# Patient Record
Sex: Female | Born: 1985 | Race: White | Hispanic: No | Marital: Married | Smoking: Never smoker
Health system: Southern US, Community
[De-identification: ages and names within clinical notes are randomized; demographics above are authoritative.]

## PROBLEM LIST (undated history)

## (undated) DIAGNOSIS — L409 Psoriasis, unspecified: Secondary | ICD-10-CM

## (undated) DIAGNOSIS — G43909 Migraine, unspecified, not intractable, without status migrainosus: Secondary | ICD-10-CM

## (undated) DIAGNOSIS — R519 Headache, unspecified: Secondary | ICD-10-CM

## (undated) DIAGNOSIS — R51 Headache: Secondary | ICD-10-CM

## (undated) DIAGNOSIS — O1490 Unspecified pre-eclampsia, unspecified trimester: Secondary | ICD-10-CM

## (undated) DIAGNOSIS — R011 Cardiac murmur, unspecified: Secondary | ICD-10-CM

## (undated) HISTORY — DX: Unspecified pre-eclampsia, unspecified trimester: O14.90

## (undated) HISTORY — DX: Psoriasis, unspecified: L40.9

## (undated) HISTORY — DX: Cardiac murmur, unspecified: R01.1

## (undated) HISTORY — DX: Headache: R51

## (undated) HISTORY — DX: Migraine, unspecified, not intractable, without status migrainosus: G43.909

## (undated) HISTORY — DX: Headache, unspecified: R51.9

---
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
## 2008-04-25 ENCOUNTER — Emergency Department: Payer: Self-pay | Admitting: Unknown Physician Specialty

## 2008-04-29 ENCOUNTER — Encounter: Payer: Self-pay | Admitting: General Practice

## 2008-05-03 ENCOUNTER — Encounter: Payer: Self-pay | Admitting: General Practice

## 2008-06-03 ENCOUNTER — Encounter: Payer: Self-pay | Admitting: General Practice

## 2008-07-03 ENCOUNTER — Encounter: Payer: Self-pay | Admitting: General Practice

## 2008-08-03 ENCOUNTER — Encounter: Payer: Self-pay | Admitting: General Practice

## 2009-01-08 ENCOUNTER — Ambulatory Visit: Payer: Self-pay | Admitting: Family

## 2009-01-09 ENCOUNTER — Other Ambulatory Visit: Payer: Self-pay | Admitting: Family

## 2009-12-31 ENCOUNTER — Emergency Department: Payer: Self-pay | Admitting: Emergency Medicine

## 2010-06-07 ENCOUNTER — Encounter: Payer: Self-pay | Admitting: Obstetrics and Gynecology

## 2010-08-18 ENCOUNTER — Observation Stay: Payer: Self-pay | Admitting: Obstetrics and Gynecology

## 2010-09-28 ENCOUNTER — Observation Stay: Payer: Self-pay

## 2010-10-25 ENCOUNTER — Ambulatory Visit: Payer: Self-pay | Admitting: Obstetrics and Gynecology

## 2010-10-26 ENCOUNTER — Ambulatory Visit: Payer: Self-pay | Admitting: Obstetrics and Gynecology

## 2010-11-03 ENCOUNTER — Observation Stay: Payer: Self-pay

## 2010-12-06 ENCOUNTER — Inpatient Hospital Stay: Payer: Self-pay | Admitting: Obstetrics and Gynecology

## 2012-03-23 ENCOUNTER — Emergency Department: Payer: Self-pay | Admitting: Emergency Medicine

## 2012-03-23 LAB — URINALYSIS, COMPLETE
Blood: NEGATIVE
Glucose,UR: NEGATIVE mg/dL (ref 0–75)
Ketone: NEGATIVE
Ph: 6 (ref 4.5–8.0)
RBC,UR: 3 /HPF (ref 0–5)
Specific Gravity: 1.029 (ref 1.003–1.030)
Squamous Epithelial: 4

## 2012-03-23 LAB — COMPREHENSIVE METABOLIC PANEL
Alkaline Phosphatase: 65 U/L (ref 50–136)
Anion Gap: 9 (ref 7–16)
BUN: 6 mg/dL — ABNORMAL LOW (ref 7–18)
Bilirubin,Total: 0.7 mg/dL (ref 0.2–1.0)
Co2: 24 mmol/L (ref 21–32)
EGFR (African American): >60
EGFR (Non-African Amer.): >60
Glucose: 89 mg/dL (ref 65–99)
Potassium: 3 mmol/L — ABNORMAL LOW (ref 3.5–5.1)
SGOT(AST): 20 U/L (ref 15–37)
SGPT (ALT): 18 U/L (ref 12–78)
Total Protein: 7.8 g/dL (ref 6.4–8.2)

## 2012-03-23 LAB — CBC
HCT: 39.5 % (ref 35.0–47.0)
HGB: 13.5 g/dL (ref 12.0–16.0)
MCH: 29.9 pg (ref 26.0–34.0)
Platelet: 245 10*3/uL (ref 150–440)
RBC: 4.53 10*6/uL (ref 3.80–5.20)
RDW: 13.7 % (ref 11.5–14.5)
WBC: 7.9 10*3/uL (ref 3.6–11.0)

## 2012-03-26 ENCOUNTER — Ambulatory Visit: Payer: Self-pay | Admitting: Obstetrics and Gynecology

## 2012-03-26 LAB — HEMOGLOBIN: HGB: 13.3 g/dL (ref 12.0–16.0)

## 2012-03-27 ENCOUNTER — Ambulatory Visit: Payer: Self-pay | Admitting: Obstetrics and Gynecology

## 2012-03-28 LAB — PATHOLOGY REPORT

## 2012-11-20 ENCOUNTER — Emergency Department: Payer: Self-pay | Admitting: Emergency Medicine

## 2012-11-20 LAB — URINALYSIS, COMPLETE
Bacteria: NONE SEEN
Glucose,UR: NEGATIVE mg/dL (ref 0–75)
Nitrite: NEGATIVE
Protein: NEGATIVE
RBC,UR: <1 /HPF (ref 0–5)
Specific Gravity: 1.01 (ref 1.003–1.030)
Squamous Epithelial: 7
WBC UR: 1 /HPF (ref 0–5)

## 2012-11-20 LAB — CBC WITH DIFFERENTIAL/PLATELET
Basophil #: 0 10*3/uL (ref 0.0–0.1)
Basophil %: 0.2 %
HCT: 42.6 % (ref 35.0–47.0)
HGB: 14.5 g/dL (ref 12.0–16.0)
MCHC: 34 g/dL (ref 32.0–36.0)
Monocyte %: 1.7 %
Neutrophil #: 9.4 10*3/uL — ABNORMAL HIGH (ref 1.4–6.5)
Neutrophil %: 87.6 %
RBC: 4.88 10*6/uL (ref 3.80–5.20)

## 2012-11-20 LAB — COMPREHENSIVE METABOLIC PANEL
Albumin: 4 g/dL (ref 3.4–5.0)
Alkaline Phosphatase: 85 U/L (ref 50–136)
Anion Gap: 9 (ref 7–16)
Calcium, Total: 9.6 mg/dL (ref 8.5–10.1)
EGFR (Non-African Amer.): >60
Glucose: 105 mg/dL — ABNORMAL HIGH (ref 65–99)
Potassium: 3.7 mmol/L (ref 3.5–5.1)
Total Protein: 7.8 g/dL (ref 6.4–8.2)

## 2012-11-20 LAB — LIPASE, BLOOD: Lipase: 83 U/L (ref 73–393)

## 2012-11-20 LAB — HCG, QUANTITATIVE, PREGNANCY: Beta Hcg, Quant.: 101458 m[IU]/mL — ABNORMAL HIGH

## 2013-01-03 HISTORY — PX: CHOLECYSTECTOMY: SHX55

## 2013-03-12 DIAGNOSIS — E348 Other specified endocrine disorders: Secondary | ICD-10-CM | POA: Insufficient documentation

## 2013-03-12 DIAGNOSIS — IMO0002 Reserved for concepts with insufficient information to code with codable children: Secondary | ICD-10-CM | POA: Insufficient documentation

## 2013-04-25 ENCOUNTER — Inpatient Hospital Stay: Payer: Self-pay

## 2013-04-25 LAB — COMPREHENSIVE METABOLIC PANEL
AST: 16 U/L (ref 15–37)
Albumin: 2.6 g/dL — ABNORMAL LOW (ref 3.4–5.0)
Alkaline Phosphatase: 115 U/L
Anion Gap: 8 (ref 7–16)
BILIRUBIN TOTAL: 1.3 mg/dL — AB (ref 0.2–1.0)
BUN: 2 mg/dL — AB (ref 7–18)
Calcium, Total: 8 mg/dL — ABNORMAL LOW (ref 8.5–10.1)
Chloride: 109 mmol/L — ABNORMAL HIGH (ref 98–107)
Co2: 24 mmol/L (ref 21–32)
Creatinine: 0.55 mg/dL — ABNORMAL LOW (ref 0.60–1.30)
EGFR (African American): >60
Glucose: 81 mg/dL (ref 65–99)
OSMOLALITY: 276 (ref 275–301)
POTASSIUM: 3 mmol/L — AB (ref 3.5–5.1)
SGPT (ALT): 16 U/L (ref 12–78)
Sodium: 141 mmol/L (ref 136–145)
Total Protein: 5.9 g/dL — ABNORMAL LOW (ref 6.4–8.2)

## 2013-04-25 LAB — URINALYSIS, COMPLETE
BILIRUBIN, UR: NEGATIVE
Bacteria: NONE SEEN
Blood: NEGATIVE
Glucose,UR: NEGATIVE mg/dL (ref 0–75)
Nitrite: NEGATIVE
PROTEIN: NEGATIVE
Ph: 7 (ref 4.5–8.0)
RBC,UR: 1 /HPF (ref 0–5)
Specific Gravity: 1.009 (ref 1.003–1.030)

## 2013-04-25 LAB — CBC WITH DIFFERENTIAL/PLATELET
Basophil #: 0 10*3/uL (ref 0.0–0.1)
Basophil %: 0.3 %
Eosinophil #: 0 10*3/uL (ref 0.0–0.7)
Eosinophil %: 0.6 %
HCT: 37.4 % (ref 35.0–47.0)
HGB: 12.5 g/dL (ref 12.0–16.0)
Lymphocyte #: 1.6 10*3/uL (ref 1.0–3.6)
Lymphocyte %: 24.6 %
MCH: 29.8 pg (ref 26.0–34.0)
MCHC: 33.4 g/dL (ref 32.0–36.0)
MCV: 89 fL (ref 80–100)
MONO ABS: 0.4 x10 3/mm (ref 0.2–0.9)
Monocyte %: 6.9 %
NEUTROS ABS: 4.4 10*3/uL (ref 1.4–6.5)
NEUTROS PCT: 67.6 %
Platelet: 171 10*3/uL (ref 150–440)
RBC: 4.19 10*6/uL (ref 3.80–5.20)
RDW: 13.6 % (ref 11.5–14.5)
WBC: 6.5 10*3/uL (ref 3.6–11.0)

## 2013-04-25 LAB — AMYLASE: Amylase: 30 U/L (ref 25–115)

## 2013-04-25 LAB — LIPASE, BLOOD: Lipase: 79 U/L (ref 73–393)

## 2013-04-26 LAB — BASIC METABOLIC PANEL
Anion Gap: 9 (ref 7–16)
BUN: 1 mg/dL — ABNORMAL LOW (ref 7–18)
CO2: 23 mmol/L (ref 21–32)
Calcium, Total: 7.9 mg/dL — ABNORMAL LOW (ref 8.5–10.1)
Chloride: 111 mmol/L — ABNORMAL HIGH (ref 98–107)
Creatinine: 0.42 mg/dL — ABNORMAL LOW (ref 0.60–1.30)
Glucose: 97 mg/dL (ref 65–99)
OSMOLALITY: 281 (ref 275–301)
Potassium: 3.5 mmol/L (ref 3.5–5.1)
Sodium: 143 mmol/L (ref 136–145)

## 2013-04-27 LAB — BASIC METABOLIC PANEL
ANION GAP: 5 — AB (ref 7–16)
BUN: 2 mg/dL — ABNORMAL LOW (ref 7–18)
CO2: 25 mmol/L (ref 21–32)
Calcium, Total: 7.8 mg/dL — ABNORMAL LOW (ref 8.5–10.1)
Chloride: 111 mmol/L — ABNORMAL HIGH (ref 98–107)
Creatinine: 0.53 mg/dL — ABNORMAL LOW (ref 0.60–1.30)
Glucose: 96 mg/dL (ref 65–99)
OSMOLALITY: 277 (ref 275–301)
Potassium: 3.6 mmol/L (ref 3.5–5.1)
SODIUM: 141 mmol/L (ref 136–145)

## 2013-04-29 ENCOUNTER — Observation Stay: Payer: Self-pay | Admitting: Obstetrics and Gynecology

## 2013-05-02 ENCOUNTER — Inpatient Hospital Stay: Payer: Self-pay | Admitting: Obstetrics and Gynecology

## 2013-05-02 LAB — CBC WITH DIFFERENTIAL/PLATELET
BASOS ABS: 0.1 10*3/uL (ref 0.0–0.1)
BASOS PCT: 0.7 %
EOS PCT: 0.3 %
Eosinophil #: 0 10*3/uL (ref 0.0–0.7)
HCT: 39.2 % (ref 35.0–47.0)
HGB: 12.8 g/dL (ref 12.0–16.0)
Lymphocyte #: 1.5 10*3/uL (ref 1.0–3.6)
Lymphocyte %: 19.2 %
MCH: 29 pg (ref 26.0–34.0)
MCHC: 32.7 g/dL (ref 32.0–36.0)
MCV: 89 fL (ref 80–100)
Monocyte #: 0.4 x10 3/mm (ref 0.2–0.9)
Monocyte %: 5.4 %
NEUTROS ABS: 6 10*3/uL (ref 1.4–6.5)
Neutrophil %: 74.4 %
PLATELETS: 169 10*3/uL (ref 150–440)
RBC: 4.42 10*6/uL (ref 3.80–5.20)
RDW: 13.5 % (ref 11.5–14.5)
WBC: 8 10*3/uL (ref 3.6–11.0)

## 2013-05-03 LAB — HEMATOCRIT: HCT: 33.7 % — ABNORMAL LOW (ref 35.0–47.0)

## 2013-05-03 LAB — GC/CHLAMYDIA PROBE AMP

## 2013-06-24 ENCOUNTER — Ambulatory Visit: Payer: Self-pay | Admitting: Surgery

## 2013-06-24 LAB — CBC WITH DIFFERENTIAL/PLATELET
BASOS PCT: 0.9 %
Basophil #: 0.1 10*3/uL (ref 0.0–0.1)
EOS PCT: 2.3 %
Eosinophil #: 0.1 10*3/uL (ref 0.0–0.7)
HCT: 38.8 % (ref 35.0–47.0)
HGB: 12.7 g/dL (ref 12.0–16.0)
LYMPHS PCT: 36 %
Lymphocyte #: 2.2 10*3/uL (ref 1.0–3.6)
MCH: 28.9 pg (ref 26.0–34.0)
MCHC: 32.9 g/dL (ref 32.0–36.0)
MCV: 88 fL (ref 80–100)
MONO ABS: 0.3 x10 3/mm (ref 0.2–0.9)
Monocyte %: 5.5 %
NEUTROS ABS: 3.4 10*3/uL (ref 1.4–6.5)
NEUTROS PCT: 55.3 %
Platelet: 272 10*3/uL (ref 150–440)
RBC: 4.41 10*6/uL (ref 3.80–5.20)
RDW: 13.7 % (ref 11.5–14.5)
WBC: 6.1 10*3/uL (ref 3.6–11.0)

## 2013-06-24 LAB — BASIC METABOLIC PANEL
ANION GAP: 8 (ref 7–16)
BUN: 11 mg/dL (ref 7–18)
CALCIUM: 9 mg/dL (ref 8.5–10.1)
CO2: 25 mmol/L (ref 21–32)
CREATININE: 0.94 mg/dL (ref 0.60–1.30)
Chloride: 107 mmol/L (ref 98–107)
EGFR (African American): >60
EGFR (Non-African Amer.): >60
GLUCOSE: 84 mg/dL (ref 65–99)
OSMOLALITY: 278 (ref 275–301)
POTASSIUM: 4.1 mmol/L (ref 3.5–5.1)
Sodium: 140 mmol/L (ref 136–145)

## 2013-06-24 LAB — HEPATIC FUNCTION PANEL A (ARMC)
ALT: 85 U/L — AB (ref 12–78)
AST: 22 U/L (ref 15–37)
Albumin: 3.9 g/dL (ref 3.4–5.0)
Alkaline Phosphatase: 211 U/L — ABNORMAL HIGH
BILIRUBIN DIRECT: 0.2 mg/dL (ref 0.00–0.20)
Bilirubin,Total: 0.9 mg/dL (ref 0.2–1.0)
Total Protein: 7.1 g/dL (ref 6.4–8.2)

## 2013-07-03 ENCOUNTER — Ambulatory Visit: Payer: Self-pay | Admitting: Surgery

## 2013-07-05 LAB — PATHOLOGY REPORT

## 2013-09-13 ENCOUNTER — Encounter: Payer: Self-pay | Admitting: Internal Medicine

## 2013-09-13 ENCOUNTER — Ambulatory Visit (INDEPENDENT_AMBULATORY_CARE_PROVIDER_SITE_OTHER): Payer: 59 | Admitting: Internal Medicine

## 2013-09-13 VITALS — BP 108/66 | HR 99 | Temp 97.9°F | Resp 14 | Ht 62.5 in | Wt 121.0 lb

## 2013-09-13 DIAGNOSIS — O26899 Other specified pregnancy related conditions, unspecified trimester: Secondary | ICD-10-CM

## 2013-09-13 DIAGNOSIS — K915 Postcholecystectomy syndrome: Secondary | ICD-10-CM | POA: Insufficient documentation

## 2013-09-13 DIAGNOSIS — O26893 Other specified pregnancy related conditions, third trimester: Secondary | ICD-10-CM

## 2013-09-13 DIAGNOSIS — M545 Low back pain, unspecified: Secondary | ICD-10-CM

## 2013-09-13 DIAGNOSIS — R5383 Other fatigue: Principal | ICD-10-CM

## 2013-09-13 DIAGNOSIS — R5381 Other malaise: Secondary | ICD-10-CM

## 2013-09-13 LAB — COMPREHENSIVE METABOLIC PANEL
ALK PHOS: 83 U/L (ref 39–117)
ALT: 29 U/L (ref 0–35)
AST: 21 U/L (ref 0–37)
Albumin: 4.3 g/dL (ref 3.5–5.2)
BUN: 11 mg/dL (ref 6–23)
CALCIUM: 9.4 mg/dL (ref 8.4–10.5)
CO2: 28 meq/L (ref 19–32)
Chloride: 105 mEq/L (ref 96–112)
Creatinine, Ser: 0.7 mg/dL (ref 0.4–1.2)
GFR: 113.16 mL/min (ref >60.00)
Glucose, Bld: 83 mg/dL (ref 70–99)
POTASSIUM: 4.2 meq/L (ref 3.5–5.1)
Sodium: 139 mEq/L (ref 135–145)
Total Bilirubin: 0.8 mg/dL (ref 0.2–1.2)
Total Protein: 7 g/dL (ref 6.0–8.3)

## 2013-09-13 LAB — CBC WITH DIFFERENTIAL/PLATELET
BASOS ABS: 0 10*3/uL (ref 0.0–0.1)
Basophils Relative: 0.4 % (ref 0.0–3.0)
Eosinophils Absolute: 0.1 10*3/uL (ref 0.0–0.7)
Eosinophils Relative: 1.6 % (ref 0.0–5.0)
HCT: 41.6 % (ref 36.0–46.0)
Hemoglobin: 14 g/dL (ref 12.0–15.0)
LYMPHS PCT: 34 % (ref 12.0–46.0)
Lymphs Abs: 1.7 10*3/uL (ref 0.7–4.0)
MCHC: 33.8 g/dL (ref 30.0–36.0)
MCV: 85.6 fl (ref 78.0–100.0)
Monocytes Absolute: 0.3 10*3/uL (ref 0.1–1.0)
Monocytes Relative: 6.6 % (ref 3.0–12.0)
Neutro Abs: 2.8 10*3/uL (ref 1.4–7.7)
Neutrophils Relative %: 57.4 % (ref 43.0–77.0)
Platelets: 239 10*3/uL (ref 150.0–400.0)
RBC: 4.86 Mil/uL (ref 3.87–5.11)
RDW: 13.9 % (ref 11.5–15.5)
WBC: 4.9 10*3/uL (ref 4.0–10.5)

## 2013-09-13 LAB — LIPID PANEL
Cholesterol: 91 mg/dL (ref 0–200)
HDL: 47.6 mg/dL (ref >39.00)
LDL CALC: 37 mg/dL (ref 0–99)
NonHDL: 43.4
TRIGLYCERIDES: 31 mg/dL (ref 0.0–149.0)
Total CHOL/HDL Ratio: 2
VLDL: 6.2 mg/dL (ref 0.0–40.0)

## 2013-09-13 LAB — VITAMIN B12: Vitamin B-12: 268 pg/mL (ref 211–911)

## 2013-09-13 LAB — TSH: TSH: 2.06 u[IU]/mL (ref 0.35–4.50)

## 2013-09-13 LAB — FERRITIN: FERRITIN: 32.4 ng/mL (ref 10.0–291.0)

## 2013-09-13 LAB — SEDIMENTATION RATE: SED RATE: 6 mm/h (ref 0–22)

## 2013-09-13 LAB — C-REACTIVE PROTEIN: CRP: <0.5 mg/dL (ref 0.5–20.0)

## 2013-09-13 MED ORDER — CHOLESTYRAMINE 4 G PO PACK: 4.0000 g | PACK | Freq: Three times a day (TID) | ORAL | Status: DC

## 2013-09-13 NOTE — Progress Notes (Signed)
Pre visit review using our clinic review tool, if applicable. No additional management support is needed unless otherwise documented below in the visit note. 

## 2013-09-13 NOTE — Patient Instructions (Signed)
Cholestyramine powder for oral suspension What is this medicine? CHOLESTYRAMINE (koe LESS tir a meen) is used to lower cholesterol in patients who are at risk of heart disease or stroke. This medicine is only for patients whose cholesterol level is not controlled by diet. This medicine may be used for other purposes; ask your health care provider or pharmacist if you have questions. COMMON BRAND NAME(S): Locholest, Locholest Light, Prevalite, Questran, Questran Light What should I tell my health care provider before I take this medicine? They need to know if you have any of these conditions: -blocked bile duct -an unusual or allergic reaction to cholestyramine, other medicines, foods, dyes, or preservatives -pregnant or trying to get pregnant -breast-feeding How should I use this medicine? Do not take this medicine in the dry form. It must be mixed with a liquid before swallowing. Follow the directions on the prescription label. Place the powder in a glass or cup. Add between 2 and 6 ounces of fluid. This can be water, milk, pulpy fruit juice, fluid soup, or other liquid. Mix well and drink all of the liquid. Take your doses at regular intervals. Do not take your medicine more often than directed. Talk to your pediatrician regarding the use of this medicine in children. Special care may be needed. Overdosage: If you think you have taken too much of this medicine contact a poison control center or emergency room at once. NOTE: This medicine is only for you. Do not share this medicine with others. What if I miss a dose? If you miss a dose, take it as soon as you can. If it is almost time for your next dose, take only that dose. Do not take double or extra doses. What may interact with this medicine? -diuretics -female hormones, like estrogens or progestins and birth control pills -heart medicines such as digoxin or digitoxin -penicillin  G -phenobarbital -phenylbutazone -phytonadione -propranolol -tetracycline antibiotics -thyroid hormones -vitamin A -vitamin D -vitamin E -warfarin Take other drugs at least 1 hour before or 4 hours after this medicine, to avoid decreasing their absorption. This list may not describe all possible interactions. Give your health care provider a list of all the medicines, herbs, non-prescription drugs, or dietary supplements you use. Also tell them if you smoke, drink alcohol, or use illegal drugs. Some items may interact with your medicine. What should I watch for while using this medicine? Visit your doctor or health care professional for regular checks on your progress. Your blood fats and other tests will be measured from time to time. This medicine is only part of a total cholesterol-lowering program. Your health care professional or dietician can suggest a low-cholesterol and low-fat diet that will reduce your risk of getting heart and blood vessel disease. Avoid alcohol and smoking, and keep a proper exercise schedule. To reduce the chance of getting constipated, drink plenty of water and increase the amount of fiber in your diet. Ask your doctor or health care professional for advice if you are constipated. What side effects may I notice from receiving this medicine? Side effects that you should report to your doctor or health care professional as soon as possible: -allergic reactions like skin rash, itching or hives, swelling of the face, lips, or tongue -bloody or black, tarry stools -severe stomach pain with nausea and vomiting -unusual bleeding Side effects that usually do not require medical attention (report to your doctor or health care professional if they continue or are bothersome): -constipation or diarrhea -dizziness -headache -heartburn,   indigestion -nausea, vomiting -perianal irritation This list may not describe all possible side effects. Call your doctor for medical  advice about side effects. You may report side effects to FDA at 1-800-FDA-1088. Where should I keep my medicine? Keep out of the reach of children. Store at room temperature between 15 and 30 degrees C (59 and 86 degrees F). Throw away any unused medicine after the expiration date. NOTE: This sheet is a summary. It may not cover all possible information. If you have questions about this medicine, talk to your doctor, pharmacist, or health care provider.  2015, Elsevier/Gold Standard. (2007-03-27 15:33:42)  

## 2013-09-13 NOTE — Progress Notes (Signed)
Patient ID: Lori Douglas, female   DOB: [birth year], 28 y.o.   MRN: [id number] Patient Active Problem List   Diagnosis Date Noted  . Other malaise and fatigue 09/13/2013  . Low back pain during pregnancy 09/13/2013  . 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 Establish Care    HPI:   Lori Feild Robbinsis a 28 y.o. female who presents with excessive fatigue, persistent diarrhea.  Back pain.  History:  Patient is a married mother of two young children wmployed as an Therapist, sports at [employer]., referred by husband Joneen Boers.  She underwent a  section 4 /30   For arrested delivery and impending fetal demise of  full term infant that had become wedged in pelvis.  During the c section was told she lost 750 ml blood.   Was not transfused.  [place].   Son spent 3 days in NICU, for respiratory failure , did not require intubation. Patient's recovery as followed by a cholecystectomy July 3532 with no complications.   She has been taking  Depo Provera since then, had 3 weeks of menstrual bleeding. Feels excessively tired.  Has been losing hair in clumps.  Since surgery her BMs have been loose post prandial movements alternating with hard stools.  No blood in stools,  No nausea. She has  lost weight , weighs less now than before her pregnancy, but she is breast feeding.    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    Past Medical History  Diagnosis Date  . Frequent headaches   . Heart murmur   . Preeclampsia   . Migraine   . Psoriasis        _0 @  Past Surgical History  Procedure Laterality Date  .  Cholecystectomy  2015  . Cesarean section  2015    History   Social History  . Marital Status: Married    Spouse Name: N/A    Number of Children: N/A  . Years of Education: N/A   Occupational History  . Not on file.   Social History Main Topics  . Smoking status: Never Smoker   . Smokeless tobacco: Never Used  . Alcohol Use: No  . Drug Use: No  . Sexual Activity: Not on file   Other Topics Concern  . Not on file   Social History Narrative  . No narrative on file    _1 @       Review of Systems:   The rest of the review of systems was negative except those addressed in the HPI.      Objective:  BP 108/66  Pulse 99  Temp(Src) 97.9 F (36.6 C) (Oral)  Resp 14  Ht 5' 2.5" (1.588 m)  Wt 121 lb (54.885 kg)  BMI 21.76 kg/m2  SpO2 98%  LMP 07/30/2013  General appearance: alert, cooperative and appears stated age Ears: normal TM's and external ear canals both ears Throat: lips, mucosa, and tongue normal; teeth and gums normal Neck: no adenopathy, no carotid bruit, supple, symmetrical, trachea midline and thyroid not enlarged, symmetric, no tenderness/mass/nodules Back:  symmetric, no curvature. ROM normal. No CVA tenderness. Lungs: clear to auscultation bilaterally Heart: regular rate and rhythm, S1, S2 normal, no murmur, click, rub or gallop Abdomen: soft, non-tender; bowel sounds normal; no masses,  no organomegaly Pulses: 2+ and symmetric Skin: Skin color, texture, turgor normal. No rashes or lesions Lymph nodes: Cervical, supraclavicular, and axillary nodes normal.  Assessment and Plan:  Other malaise and fatigue She has no signs of anemia, B12 deficiency or thryoid disorder but her iron stores are borderline low .  Will recommended treating with wellbutrin or mirtazapine as a trial for post partum depression   Low back pain during pregnancy CRP and ESR are normal os psoriatic arthritis unlikely.  Plain films of pelvis ordered to rule out  subacute fracture  Post-cholecystectomy syndrome Trial of Questran pre meal    Updated Medication List Outpatient Encounter Prescriptions as of 09/13/2013  Medication Sig  . calcipotriene-betamethasone (TACLONEX) ointment Apply 1 application topically daily.  . medroxyPROGESTERone (DEPO-PROVERA) 150 MG/ML injection Inject 150 mg into the muscle every 3 (three) months.   . Prenatal Multivit-Min-Fe-FA (PRENATAL VITAMINS PO) Take 1 tablet by mouth daily.  . ranitidine (ZANTAC) 150 MG capsule Take 150 mg by mouth 2 (two) times daily.   . cholestyramine (QUESTRAN) 4 G packet Take 1 packet (4 g total) by mouth 3 (three) times daily with meals.     Orders Placed This Encounter  Procedures  . DG Pelvis Comp Min 3V  . Vitamin B12  . CBC with Differential  . Folate RBC  . Ferritin  . Iron and TIBC  . TSH  . Lipid panel  . Comprehensive metabolic panel  . Sedimentation rate  . ANA w/Reflex if Positive  . C-reactive protein  . Rheumatoid factor    No Follow-up on file.

## 2013-09-14 ENCOUNTER — Encounter: Payer: Self-pay | Admitting: Internal Medicine

## 2013-09-14 LAB — IRON AND TIBC
%SAT: 17 % — ABNORMAL LOW (ref 20–55)
Iron: 65 ug/dL (ref 42–145)
TIBC: 380 ug/dL (ref 250–470)
UIBC: 315 ug/dL (ref 125–400)

## 2013-09-14 LAB — RHEUMATOID FACTOR: Rhuematoid fact SerPl-aCnc: <10 IU/mL (ref <=14)

## 2013-09-15 NOTE — Assessment & Plan Note (Signed)
Trial of Questran pre meal

## 2013-09-15 NOTE — Assessment & Plan Note (Signed)
CRP and ESR are normal os psoriatic arthritis unlikely.  Plain films of pelvis ordered to rule out subacute fracture

## 2013-09-15 NOTE — Assessment & Plan Note (Signed)
She has no signs of anemia, B12 deficiency or thryoid disorder but her iron stores are borderline low .  Will recommended treating with wellbutrin or mirtazapine as a trial for post partum depression

## 2013-09-17 ENCOUNTER — Other Ambulatory Visit: Payer: Self-pay | Admitting: Internal Medicine

## 2013-09-17 DIAGNOSIS — F53 Postpartum depression: Secondary | ICD-10-CM | POA: Insufficient documentation

## 2013-09-17 DIAGNOSIS — O99345 Other mental disorders complicating the puerperium: Principal | ICD-10-CM

## 2013-09-17 LAB — FOLATE

## 2013-09-17 LAB — ANA W/REFLEX IF POSITIVE: ANA: NEGATIVE

## 2013-09-17 MED ORDER — BUPROPION HCL ER (XL) 150 MG PO TB24: 150.0000 mg | ORAL_TABLET | Freq: Every day | ORAL | Status: DC

## 2013-10-15 ENCOUNTER — Ambulatory Visit: Payer: Self-pay | Admitting: Internal Medicine

## 2013-10-16 ENCOUNTER — Telehealth: Payer: Self-pay | Admitting: Internal Medicine

## 2013-10-16 DIAGNOSIS — M533 Sacrococcygeal disorders, not elsewhere classified: Secondary | ICD-10-CM

## 2013-10-16 NOTE — Telephone Encounter (Signed)
Need order to fax to [facility] for view of pelvis sacrum and coccyx please advise DX to associate with and look at orders in system are OK?

## 2013-10-18 ENCOUNTER — Ambulatory Visit: Payer: Self-pay | Admitting: Internal Medicine

## 2013-10-21 ENCOUNTER — Telehealth: Payer: Self-pay | Admitting: Internal Medicine

## 2013-10-21 ENCOUNTER — Ambulatory Visit (INDEPENDENT_AMBULATORY_CARE_PROVIDER_SITE_OTHER): Payer: 59 | Admitting: Internal Medicine

## 2013-10-21 ENCOUNTER — Encounter: Payer: Self-pay | Admitting: Internal Medicine

## 2013-10-21 VITALS — BP 98/66 | HR 102 | Temp 98.1°F | Resp 16 | Ht 62.5 in | Wt 114.0 lb

## 2013-10-21 DIAGNOSIS — K915 Postcholecystectomy syndrome: Secondary | ICD-10-CM

## 2013-10-21 DIAGNOSIS — M545 Low back pain, unspecified: Secondary | ICD-10-CM

## 2013-10-21 DIAGNOSIS — O26893 Other specified pregnancy related conditions, third trimester: Secondary | ICD-10-CM

## 2013-10-21 DIAGNOSIS — F53 Postpartum depression: Secondary | ICD-10-CM

## 2013-10-21 DIAGNOSIS — O99345 Other mental disorders complicating the puerperium: Secondary | ICD-10-CM

## 2013-10-21 DIAGNOSIS — O2693 Pregnancy related conditions, unspecified, third trimester: Secondary | ICD-10-CM

## 2013-10-21 MED ORDER — DICYCLOMINE HCL 10 MG PO CAPS: 10.0000 mg | ORAL_CAPSULE | Freq: Three times a day (TID) | ORAL | Status: DC

## 2013-10-21 MED ORDER — MIRTAZAPINE 15 MG PO TABS: 15.0000 mg | ORAL_TABLET | Freq: Every day | ORAL | Status: DC

## 2013-10-21 NOTE — Telephone Encounter (Signed)
Your hip and sacral films were completely normal. There were no signs of arthritis or inflammation.  If you would like to see a Physiatrist,   I am happy  to refer to Cr. Chesiis

## 2013-10-21 NOTE — Telephone Encounter (Signed)
Patient notified

## 2013-10-21 NOTE — Progress Notes (Signed)
Patient ID: Lori Douglas, female   DOB: [birth year], 28 y.o.   MRN: [id number]   Patient Active Problem List   Diagnosis Date Noted  . 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 Other malaise and fatigue 09/13/2013  . Low back pain during pregnancy 09/13/2013  . 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 Follow-up    weight loss    HPI:   Lori Douglas is a 28 y.o. female who presents for  Follow up on low back pain ,  Depression,  Diarrhea.  Accompanied by husband Jake Sharkharold and her two children.    Back pain:  Present since the third trimester of last pregnancy, evaluation deferred by her OB.  At last visit I ordered palin filsm of sacrum and hips,  All were normal.   Depression:  Multiple somatic symptoms and mood has improved with initiation of wellbutrin.  However,  she  Reports frequent new onset insomnia secondary to feeling wired,  And has lost her appetite,  She has lost 7 lbs since last visit. We discussed adding remeron at bedtime  Diarrhea has improved and is only occurring  2-3 times per week,  Forgetting to take questran   IBS discussed,  Trial of bentyl .   Past Medical History  Diagnosis Date  . Frequent headaches   . Heart murmur   . Preeclampsia   . Migraine   . Psoriasis     Past Surgical History  Procedure Laterality Date  . Cholecystectomy  2015  . Cesarean section  2015       The following portions of the patient's history were reviewed and updated as appropriate: Allergies, current medications, and problem list.    Review of Systems:   Patient denies headache, fevers, malaise, unintentional weight loss, skin rash, eye pain, sinus congestion and sinus pain, sore throat, dysphagia,  hemoptysis , cough, dyspnea, wheezing, chest pain, palpitations, orthopnea, edema, abdominal pain, nausea, melena, diarrhea, constipation, flank pain, dysuria, hematuria, urinary  Frequency, nocturia,  numbness, tingling, seizures,  Focal weakness, Loss of consciousness,  Tremor, insomnia, depression, anxiety, and suicidal ideation.     History   Social History  . Marital Status: Married    Spouse Name: N/A    Number of Children: N/A  . Years of Education: N/A   Occupational History  . Not on file.   Social History Main Topics  . Smoking status: Never Smoker   . Smokeless tobacco: Never Used  . Alcohol Use: No  . Drug Use: No  . Sexual Activity: Not on file   Other Topics Concern  . Not on file   Social History Narrative  . No narrative on file    Objective:  Filed Vitals:   10/21/13 1552  BP: 98/66  Pulse: 102  Temp: 98.1 F (36.7 C)  Resp: 16     General appearance: alert, cooperative and appears stated age Neck: no adenopathy, no carotid bruit, supple, symmetrical, trachea midline and thyroid not enlarged, symmetric, no tenderness/mass/nodules Back: symmetric, no curvature. ROM normal. No CVA tenderness. Lungs: clear to auscultation bilaterally Heart: regular rate and rhythm, S1, S2 normal, no murmur, click, rub or gallop Abdomen: soft, non-tender; bowel sounds normal; no masses,  no organomegaly Psych: affect normal, makes good eye contact. No fidgeting,  Smiles easily.  Denies suicidal thoughts   Assessment and Plan:  Post-cholecystectomy syndrome Diarrhea has improved despite inconsistent use of Questran.  Considering IBS  etiology,  Trial of Bentyl.   Post partum depression Improved mood and fatigue with wellbutrin,  Adding remeron for appetite loss and insomnia. Discussed 6 month trial ,  Then wean.   Low back pain during pregnancy MSK.  No signs of inflammatory arthritis or DJD by current workup     Updated Medication List Outpatient Encounter Prescriptions as of 10/21/2013  Medication Sig  . buPROPion (WELLBUTRIN XL) 150 MG 24 hr tablet Take 1 tablet (150 mg total) by mouth daily.  . calcipotriene-betamethasone (TACLONEX) ointment Apply 1  application topically daily.  . medroxyPROGESTERone (DEPO-PROVERA) 150 MG/ML injection Inject 150 mg into the muscle every 3 (three) months.   . Prenatal Multivit-Min-Fe-FA (PRENATAL VITAMINS PO) Take 1 tablet by mouth daily.  . ranitidine (ZANTAC) 150 MG capsule Take 150 mg by mouth 2 (two) times daily.   . [DISCONTINUED] cholestyramine (QUESTRAN) 4 G packet Take 1 packet (4 g total) by mouth 3 (three) times daily with meals.  . dicyclomine (BENTYL) 10 MG capsule Take 1 capsule (10 mg total) by mouth 4 (four) times daily -  before meals and at bedtime.  . mirtazapine (REMERON) 15 MG tablet Take 1 tablet (15 mg total) by mouth at bedtime.     No orders of the defined types were placed in this encounter.    No Follow-up on file.

## 2013-10-21 NOTE — Patient Instructions (Signed)
Continue the wellbutrin  I am adding generic Remeron 15 mg at bedtime to help you rest and improve your appetite   aStop the cholestyramine  Trial of generic Bentyl to take before each "problem" meal

## 2013-10-21 NOTE — Progress Notes (Signed)
Pre-visit discussion using our clinic review tool. No additional management support is needed unless otherwise documented below in the visit note.  

## 2013-10-21 NOTE — Assessment & Plan Note (Signed)
Your hip and sacral films were completely normal. There were no signs of arthritis or inflammation.  If you would like to see a Physiatrist,   I am happy  to refer to Cr. Chesiis  

## 2013-10-22 ENCOUNTER — Encounter: Payer: Self-pay | Admitting: Internal Medicine

## 2013-10-22 NOTE — Assessment & Plan Note (Signed)
Diarrhea has improved despite inconsistent use of Questran.  Considering IBS etiology,  Trial of Bentyl.

## 2013-10-22 NOTE — Assessment & Plan Note (Signed)
Improved mood and fatigue with wellbutrin,  Adding remeron for appetite loss and insomnia. Discussed 6 month trial ,  Then wean.

## 2013-10-22 NOTE — Assessment & Plan Note (Signed)
MSK.  No signs of inflammatory arthritis or DJD by current workup

## 2013-11-14 ENCOUNTER — Encounter: Payer: Self-pay | Admitting: Internal Medicine

## 2013-12-13 ENCOUNTER — Other Ambulatory Visit: Payer: Self-pay | Admitting: Internal Medicine

## 2014-01-22 ENCOUNTER — Encounter: Payer: Self-pay | Admitting: Internal Medicine

## 2014-01-22 ENCOUNTER — Ambulatory Visit (INDEPENDENT_AMBULATORY_CARE_PROVIDER_SITE_OTHER): Payer: 59 | Admitting: Internal Medicine

## 2014-01-22 VITALS — BP 116/76 | HR 103 | Temp 97.9°F | Resp 16 | Ht 62.5 in | Wt 131.5 lb

## 2014-01-22 DIAGNOSIS — F53 Postpartum depression: Secondary | ICD-10-CM

## 2014-01-22 DIAGNOSIS — O99345 Other mental disorders complicating the puerperium: Principal | ICD-10-CM

## 2014-01-22 NOTE — Progress Notes (Signed)
Pre-visit discussion using our clinic review tool. No additional management support is needed unless otherwise documented below in the visit note.  

## 2014-01-22 NOTE — Progress Notes (Signed)
Patient ID: Lori Douglas, female   DOB: 01/03/1986, 29 y.o.   MRN: [id number]   Patient Active Problem List   Diagnosis Date Noted  . 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 Other malaise and fatigue 09/13/2013  . Low back pain during pregnancy 09/13/2013  . 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 Follow-up    post partum depression    HPI:   Lori Douglas is a 29 y.o. female who presents for  Follow up on post partum depression.  At her last visit 3 months ago she was prescribed remeron for significant weight loss,  Fatigue,  With good results.  Has Gained 17 lbs, sleeping better , but reports being   hungry all the time.  She has reduced her dose to  7.5 mg and appetite is still voracious.  So she is asking for an alternative .      Past Medical History  Diagnosis Date  . Frequent headaches   . Heart murmur   . Preeclampsia   . Migraine   . Psoriasis     Past Surgical History  Procedure Laterality Date  . Cholecystectomy  2015  . Cesarean section  2015       The following portions of the patient's history were reviewed and updated as appropriate: Allergies, current medications, and problem list.    Review of Systems:   Patient denies headache, fevers, malaise, unintentional weight loss, skin rash, eye pain, sinus congestion and sinus pain, sore throat, dysphagia,  hemoptysis , cough, dyspnea, wheezing, chest pain, palpitations, orthopnea, edema, abdominal pain, nausea, melena, diarrhea, constipation, flank pain, dysuria, hematuria, urinary  Frequency, nocturia, numbness, tingling, seizures,  Focal weakness, Loss of consciousness,  Tremor, insomnia, depression, anxiety, and suicidal ideation.     History   Social History  . Marital Status: Married    Spouse Name: N/A    Number of Children: N/A  . Years of Education: N/A   Occupational History  . Not on file.   Social History Main  Topics  . Smoking status: Never Smoker   . Smokeless tobacco: Never Used  . Alcohol Use: No  . Drug Use: No  . Sexual Activity: Not on file   Other Topics Concern  . Not on file   Social History Narrative    Objective:  Filed Vitals:   01/22/14 0941  BP: 116/76  Pulse: 103  Temp: 97.9 F (36.6 C)  Resp: 16     General appearance: alert, cooperative and appears stated age Ears: normal TM's and external ear canals both ears Throat: lips, mucosa, and tongue normal; teeth and gums normal Neck: no adenopathy, no carotid bruit, supple, symmetrical, trachea midline and thyroid not enlarged, symmetric, no tenderness/mass/nodules Back: symmetric, no curvature. ROM normal. No CVA tenderness. Lungs: clear to auscultation bilaterally Heart: regular rate and rhythm, S1, S2 normal, no murmur, click, rub or gallop Abdomen: soft, non-tender; bowel sounds normal; no masses,  no organomegaly Pulses: 2+ and symmetric Skin: Skin color, texture, turgor normal. No rashes or lesions Lymph nodes: Cervical, supraclavicular, and axillary nodes normal.  Assessment and Plan:  Post partum depression remarkably improved  With initiation of remeron therapy. She is ready to discontinue therapy after reducing er dose to 7.5 mg daily .  Recommended that she  stop the medication.  Trial of wellbutrin for continued post partum depressive symptoms.      Updated Medication List Outpatient Encounter  Prescriptions as of 01/22/2014  Medication Sig  . buPROPion (WELLBUTRIN XL) 150 MG 24 hr tablet TAKE 1 TABLET BY MOUTH DAILY.  . medroxyPROGESTERone (DEPO-PROVERA) 150 MG/ML injection Inject 150 mg into the muscle every 3 (three) months.   . Prenatal Multivit-Min-Fe-FA (PRENATAL VITAMINS PO) Take 1 tablet by mouth daily.  . ranitidine (ZANTAC) 150 MG capsule Take 150 mg by mouth 2 (two) times daily.   . [DISCONTINUED] mirtazapine (REMERON) 15 MG tablet Take 1 tablet (15 mg total) by mouth at bedtime.  .  calcipotriene-betamethasone (TACLONEX) ointment Apply 1 application topically daily.  . [DISCONTINUED] dicyclomine (BENTYL) 10 MG capsule Take 1 capsule (10 mg total) by mouth 4 (four) times daily -  before meals and at bedtime. (Patient not taking: Reported on 01/22/2014)     No orders of the defined types were placed in this encounter.    No Follow-up on file.

## 2014-01-22 NOTE — Patient Instructions (Signed)
Ok to stop the remeron and continue wellbutrin for now.  If you have lost weight in 2-3 weeks.  Let me know and we'll consider switching to citalopram

## 2014-01-25 NOTE — Assessment & Plan Note (Addendum)
remarkably improved  With initiation of remeron therapy. She is ready to discontinue therapy after reducing er dose to 7.5 mg daily .  Recommended that she  stop the medication.  Trial of wellbutrin for continued post partum depressive symptoms.

## 2014-04-25 NOTE — Op Note (Signed)
PATIENT NAME:  Lori Douglas, Lori Douglas MR#:  [id number] DATE OF BIRTH:  Nov 27, 1985  DATE OF PROCEDURE:  03/27/2012  PREOPERATIVE DIAGNOSIS: Missed abortion.   POSTOPERATIVE DIAGNOSIS: Missed abortion.  PROCEDURE:  Suction dilation and curettage.   SURGEON: Suzy Bouchardhomas J. Navjot Loera, M.D.   ANESTHESIA: General.   INDICATION: This is a 29 year old gravida 3, para 1 at 5911 + 5 weeks. The patient with spotting for the last 3 days. The patient underwent an ultrasound that showed an intrauterine pregnancy of 6 weeks 1 day with negative fetal heart motion. Blood type is A positive.   DESCRIPTION OF PROCEDURE: After adequate general anesthesia, the patient was placed in the dorsal supine position with the legs in the candy-cane stirrups. The lower abdomen, perineum and vagina were prepped and draped in normal sterile fashion. Straight catheterization of the bladder yielded 100 mL clear urine. A weighted speculum was placed in the posterior vaginal vault. The anterior cervix was grasped with a single-tooth tenaculum and the cervix was then dilated to #20 Hanks dilator without difficulties.  A #8 flexible suction curette was then placed into the endometrial cavity. Products of conception removed. Sharp curettage was then performed with good uterine cry throughout and repeat suction curettage yielding no additional tissue. Good hemostasis noted. Single-tooth tenaculum was removed and silver nitrate was applied to the tenacula site. Estimated blood loss 10 mL. Intraoperative fluids 300 mL. The patient tolerated the procedure well and was taken to the recovery room in good condition.  ____________________________ Suzy Bouchardhomas J. Reinhardt Licausi, MD [id number] D: 03/27/2012 08:09:15 ET    T: 03/27/2012 08:22:45 ET        JOB#: [id number] cc: Suzy Bouchardhomas J. Xzavion Doswell, MD, <Dictator> Suzy BouchardHOMAS J Rosilyn Coachman MD ELECTRONICALLY SIGNED 03/31/2012 12:03

## 2014-04-26 NOTE — Op Note (Signed)
PATIENT NAME:  Lori MaineROBBINS, Darchelle N MR#:  [id number] DATE OF BIRTH:  08/14/1985  DATE OF PROCEDURE:  06/03/2013  PREOPERATIVE DIAGNOSIS: Symptomatic cholelithiasis.   POSTOPERATIVE DIAGNOSIS: Symptomatic cholelithiasis.   PROCEDURE: Laparoscopic cholecystectomy.   SURGEON: Dionne Miloichard Angella Montas, M.D.   Threasa HeadsASSISTANBarrington Ellison: [place]app[place]as.   INDICATIONS: This is a patient with recurrent epigastric right upper quadrant pain associated with fatty food intolerance in a post partum patient.  Preoperatively, we discussed rationale for surgery, the options of observation, risk of bleeding, infection, recurrence of symptoms, failure to resolve her symptoms, open procedure, bile duct damage, bile duct leak, retained common bile duct stone, any of which could require further surgery and/or ERCP, stent, and papillotomy. This was all reviewed for she and her husband in the preop holding area. They understood and agreed to proceed.   FINDINGS: Scar in the area of the gallbladder, multiple dilated vasculature on the edges of the gallbladder from the liver independent of the cystic artery.   DESCRIPTION OF PROCEDURE: The patient was induced with general anesthesia, given IV antibiotics. VTE prophylaxis was in place. She was prepped and draped in a sterile fashion. Marcaine was infiltrated in skin and subcutaneous tissues around the periumbilical area. Incision was made. Veress needle was placed. Pneumoperitoneum was obtained. A 5 mm trocar port was placed. The abdominal cavity was explored and under direct vision a 10 mm epigastric port and 2 lateral 5 mm ports were placed.  Gallbladder was placed on tension. Adhesions were taken down bluntly without the use of energy. The peritoneum over the infundibulum was incised bluntly to reveal the cystic duct gallbladder junction and the cystic artery and node of Calot.  The cystic artery was doubly clipped and divided, allowing for good visualization of the cystic duct as it entered the  infundibulum.  It was doubly clipped and divided and the gallbladder was taken from the gallbladder fossa with electrocautery.  Another branch of the cystic artery was encountered and doubly clipped and divided, and then multiple small vessels along the lateral and medial edges of the gallbladder coming from the liver were cauterized. One of these essentially at the fundus of the gallbladder was doubly clipped.  It was quite large, coming directly from the liver.  The gallbladder is passed out through the epigastric port site with the aid of an Endo Catch bag. The area was checked for hemostasis and found to be adequate. There was no sign of bleeding, bile leak or bowel injury. The camera was placed in the epigastric site to view back at the periumbilical site. There was no sign of bowel injury or adhesions. Therefore, pneumoperitoneum was released. All ports were removed. Fascial edges at the epigastric site were approximated with figure-of-eight 0 Vicryls; 4-0 subcuticular Monocryl was used at all skin edges. Steri-Strips, Mastisol, and sterile dressings were placed.  The patient tolerated the procedure well. There were no complications. She was taken to the recovery room in stable condition to be discharged to the care of her family.  Follow up in 10 days.    ____________________________ Adah Salvageichard E. Excell Seltzerooper, MD rec:ts D: 07/03/2013 09:52:27 ET T: 07/03/2013 11:00:14 ET JOB#: [id number]  cc: Adah Salvageichard E. Excell Seltzerooper, MD, <Dictator> Lattie HawICHARD E Itzamar Traynor MD ELECTRONICALLY SIGNED 07/03/2013 14:42

## 2014-04-26 NOTE — Op Note (Signed)
PATIENT NAME:  Lori Douglas, Lori Douglas MR#:  [id number] DATE OF BIRTH:  03/29/1985  DATE OF PROCEDURE:  05/02/2013  PREOPERATIVE DIAGNOSES:  1. Estimated gestational age 27 plus 5 weeks.  2. Worsening abdominal and back pain.  3. Cholelithiasis.  4. Advancing cervical dilation 3 cm.  5. Prior third-degree laceration. 6. Elective cesarean section.   POSTOPERATIVE DIAGNOSES:  1. Estimated gestational age 27 plus 5 weeks.  2. Worsening abdominal and back pain.  3. Cholelithiasis.  4. Advancing cervical dilation 3 cm.  5. Prior third-degree laceration. 6. Elective cesarean section.   PROCEDURES:  1. Primary low transverse cesarean section.  2. On-Q pump placement.   ANESTHESIA: Spinal.   SURGEON: Jennell Corner, M.D.   INDICATION: A 29 year old gravida 4, para 1, patient at 49 plus 5 weeks. The patient has been admitted to the hospital with increasing frequency, with worsening abdominal and back pain. The patient is known to have cholelithiasis. Had significant nausea and vomiting and hypokalemia recently. The patient's last delivery resulted in a third-degree laceration. The patient wishes to avoid the third-degree laceration this time and elects for a primary low transverse cesarean section. Given the patient was contracting and cervix was 3 cm and her clinical symptoms, we proceeded to a primary low transverse cesarean section.   PROCEDURE: After adequate spinal anesthesia, the patient was placed in the dorsal supine position with a hip roll under the right side. The patient's abdomen was prepped and draped in normal sterile fashion. The patient previously received 2 g of IV Ancef prior to commencement of the case. A Pfannenstiel incision was made 2 fingerbreadths above the symphysis pubis. Sharp dissection was used to identify the fascia. The fascia was opened in the midline and opened in a transverse fashion. The superior aspect of the fascia was grasped with Kocher clamps, and the recti  muscles were dissected free. The inferior aspect of the fascia was grasped with Kocher clamps, and the pyramidalis muscle was dissected free. Entry into the peritoneal cavity was accomplished sharply. The vesicouterine peritoneal fold was identified, and the bladder flap was created. The bladder was reflected inferiorly. A low transverse uterine incision was made. Upon entry into the endometrial cavity, clear fluid resulted. A large fetal head was brought to the incision. A vacuum was applied to the occiput and with 1 gentle pull, the head was delivered. The vacuum was removed. The shoulders and body were delivered without difficulty. There was a loose torso cord that was reduced. The cord was doubly clamped, and vigorous female was passed to nursery staff who assigned Apgar scores of 3 and 7, weight 3250 grams. Placenta was manually delivered, and the uterus was exteriorized. Endometrial cavity was wiped clean with laparotomy tape, and the cervix was opened with a ring forceps and this was passed off the operative field. The uterine incision was closed with 1 chromic suture, a running locking suture. Good hemostasis was noted. Additional figure-of-eight sutures were required for good hemostasis. Fallopian tubes and ovaries appeared normal. The posterior cul-de-sac was irrigated and suctioned. The uterus was placed back into the abdominal cavity, and the paracolic gutters were wiped clean with laparotomy tape. Again, the uterine incision appeared hemostatic. Interceed was placed over the uterine incision in a T-shaped fashion. The superior aspect of the fascia was then grasped with Kocher clamps, and the On-Q pump catheters were advanced from an infraumbilical area to a subfascial area and the fascia was closed over top of this with 0 Vicryl suture. Good approximation  of the edges. Good hemostasis was noted. Subcutaneous tissues were irrigated and bovied for hemostasis, and the skin was reapproximated with staples.  The On-Q pump catheters were secured at the skin level with Dermabond and Steri-Stripped to the skin, and a Tegaderm was placed over top of this. Each catheter was loaded with 5 mL of 5% Marcaine. The patient tolerated the procedure well. Estimated blood 700 mL. Intraoperative fluids 1500 mL. The patient was taken to the recovery room in good condition.   ____________________________ Suzy Bouchardhomas J. Schermerhorn, MD tjs:gb D: 05/02/2013 16:20:24 ET T: 05/03/2013 01:39:57 ET JOB#: [id number]  cc: Suzy Bouchardhomas J. Schermerhorn, MD, <Dictator> Suzy BouchardHOMAS J SCHERMERHORN MD ELECTRONICALLY SIGNED 05/04/2013 12:07

## 2014-04-26 NOTE — H&P (Signed)
PATIENT NAME:  Lori MaineROBBINS, Belina N MR#:  [id number] DATE OF BIRTH:  25-Sep-1985  DATE OF ADMISSION:  07/03/2013  CHIEF COMPLAINT: Right upper quadrant pain.   HISTORY OF PRESENT ILLNESS: (Dictation Anomaly) <<>>. She is postpartum. She has had multiple attacks of recurrent right upper quadrant pain and epigastric pain associated with fatty food intolerance. Work-up has shown gallstones without signs of choledocholithiasis. She is here for elective laparoscopic cholecystectomy.   PAST MEDICAL HISTORY: Heart murmur and cholelithiasis.   PAST SURGICAL HISTORY: D and C and C-section.   MEDICATIONS: Multiple; see chart.   ALLERGIES: SULFA AND PENICILLINS.   SOCIAL HISTORY: The patient does not smoke or drink.   FAMILY HISTORY: Noncontributory.   REVIEW OF SYSTEMS: A 10 system review has been performed and is negative, with the exception of that mentioned in the HPI, and is documented in the office chart.   PHYSICAL EXAMINATION: GENERAL: Healthy female patient, nonobese.  HEENT: No scleral icterus.  NECK: No palpable neck nodes.  CHEST: Clear to auscultation.  CARDIAC: Regular rate and rhythm.  ABDOMEN: Soft, nontender. Pfannenstiel scar is noted.  EXTREMITIES: Without edema.  NEUROLOGIC: Grossly intact.  INTEGUMENT: No jaundice.   DIAGNOSTIC DATA: An ultrasound shows stones. Laboratory values demonstrate a creatinine of 0.9. Electrolytes within normal limits. Hemoglobin and hematocrit of 12.7 and 39 with a platelet count 272. AST is 22, ALT slightly elevated at 85, alkaline phosphatase 211 and a normal bilirubin of 0.9.   ASSESSMENT AND PLAN: This is a patient with symptomatic cholelithiasis. She is here for elective laparoscopic cholecystectomy. The rationale for offering surgery has been discussed. The options of observation have been reviewed, and the risks of bleeding, infection, recurrence of symptoms, failure to resolve her symptoms, open procedure, bile duct damage, bile duct leak,  retained common bile duct stone, any of which could require further surgery and/or ERCP, stent, and papillotomy, have all been reviewed. She understood and agreed to proceed.     ____________________________ Adah Salvageichard E. Excell Seltzerooper, MD rec:cg D: 07/01/2013 17:58:53 ET T: 07/01/2013 23:59:12 ET JOB#: [id number]  cc: Adah Salvageichard E. Excell Seltzerooper, MD, <Dictator> Lattie HawICHARD E COOPER MD ELECTRONICALLY SIGNED 07/02/2013 9:33

## 2014-05-13 NOTE — H&P (Signed)
L&D Evaluation:  History:  HPI 29 yo [id number] with LMP of 08/08/12 & EDD of 05/15/13 and US with EDD of 05/25/13 here due to "low back pain in the coccygeal area starting today and causing pt to cry due to pain". Pt had some relief with Stadol and no relief with Percocet. No N,V, or symptoms of dehydration but, did vomit this am. No ROM, VB, decreased FM or UC's. Pt has gallstones and Dr Katrinka BlazingSmith is planning on performing a cholestectomy in the near future after she delivers. Pt may be scheduled for a 39 week C-section per Dr Feliberto GottronSchermerhorn due to 3rd degree last delivery.   Presents with back pain, Lower coccygeal pain   Patient's Medical History Gallstones, corpus luteal defect, 3rd degree laceration, multiple Gallstones, non-obstructing   Patient's Surgical History none  D&C   Medications Pre Natal Vitamins   Allergies Sulfa, Biaxin   Social History none   Family History Non-Contributory   ROS:  ROS All systems were reviewed.  HEENT, CNS, GI, GU, Respiratory, CV, Renal and Musculoskeletal systems were found to be normal.   Exam:  Vital Signs stable   General no apparent distress   Mental Status clear   Chest clear   Heart normal sinus rhythm, no murmur/gallop/rubs   Abdomen gravid, non-tender   Estimated Fetal Weight Average for gestational age   Back no CVAT   Reflexes 1+   Clonus negative   Pelvic not evaluated   Mebranes Intact   FHT normal rate with no decels, Reactive NST with 2 accels 15 x 15 BPM   Ucx irregular   Skin dry   Lymph no lymphadenopathy   Impression:  Impression IUP at 36 2/7 weeks with coccygeal pain   Plan:  Plan discharge, Dc home with Vicodin for pain control   Comments Advised to call [facility] in am for therapy since her low back is requiring some treatment such as electric stim.   Electronic Signatures: Sharee PimpleJones, Caron W (CNM)  (Signed 27-Apr-15 18:47)  Authored: L&D Evaluation   Last Updated: 27-Apr-15  18:47 by Sharee PimpleJones, Caron W (CNM)

## 2014-05-13 NOTE — H&P (Signed)
L&D Evaluation:  History:  HPI 35+4 weeks G4P1 with acute onset N/V and lower and upper abd pain. Low ROM or vag bleeding. K+ tonight 3.0.Pt has a documented h/o of cholelithiasis. Repeat u/s shows no acute cholecystitis  and still multiple gallstones.   Patient's Medical History cholelithiasis   Patient's Surgical History none   Allergies PCN, Sulfa, other, biaxin   Social History none   Family History Non-Contributory   Exam:  Vital Signs stable   General ill appearing   Mental Status clear   Chest clear   Heart tachy   Estimated Fetal Weight Average for gestational age   Back no CVAT   Edema no edema   Pelvic 1-2 /30%/ -2   Mebranes Intact   FHT normal rate with no decels   Fetal Heart Rate 150   Ucx irregular   Skin dry   Impression:  Impression n/v [redacted] weeks gestation. Etiology ? viral? cholelithiasis  Hypokalemia   Plan:  Plan IVF , Iv phenergan, replacement of K+,  repeat labs in am. NPO   Electronic Signatures: Schermerhorn, Ihor Austinhomas J (MD)  (Signed 23-Apr-15 22:07)  Authored: L&D Evaluation   Last Updated: 23-Apr-15 22:07 by Schermerhorn, Ihor Austinhomas J (MD)

## 2014-06-13 ENCOUNTER — Encounter: Payer: Self-pay | Admitting: Internal Medicine

## 2014-06-13 DIAGNOSIS — F5102 Adjustment insomnia: Secondary | ICD-10-CM

## 2014-06-16 DIAGNOSIS — F5102 Adjustment insomnia: Secondary | ICD-10-CM | POA: Insufficient documentation

## 2014-06-16 MED ORDER — TRAZODONE HCL 50 MG PO TABS: 25.0000 mg | ORAL_TABLET | Freq: Every evening | ORAL | Status: DC | PRN

## 2014-07-23 ENCOUNTER — Ambulatory Visit (INDEPENDENT_AMBULATORY_CARE_PROVIDER_SITE_OTHER): Payer: 59 | Admitting: Internal Medicine

## 2014-07-23 ENCOUNTER — Encounter: Payer: Self-pay | Admitting: Internal Medicine

## 2014-07-23 VITALS — BP 104/64 | HR 77 | Temp 98.1°F | Resp 14 | Ht 63.0 in | Wt 135.5 lb

## 2014-07-23 DIAGNOSIS — F5102 Adjustment insomnia: Secondary | ICD-10-CM | POA: Diagnosis not present

## 2014-07-23 DIAGNOSIS — O99345 Other mental disorders complicating the puerperium: Secondary | ICD-10-CM

## 2014-07-23 DIAGNOSIS — F53 Postpartum depression: Secondary | ICD-10-CM

## 2014-07-23 MED ORDER — ALPRAZOLAM 0.25 MG PO TABS
0.2500 mg | ORAL_TABLET | Freq: Every evening | ORAL | Status: DC | PRN
Start: 2014-07-23 — End: 2014-10-03

## 2014-07-23 NOTE — Patient Instructions (Signed)
I am prescribing low dose alprazolam to use AS NEEDED for insomnia   The  diet I discussed with you today is the 10 day Green Smoothie Cleansing /Detox Diet by [name] available on [employer] for around $10.  This is not a low carb or a weight loss diet,  It is fundamentally a "cleansing" low fat diet that eliminates sugar, gluten, caffeine, alcohol and dairy for 10 days .  What you add back after the initial ten days is entirely up to  you!  You can expect to lose 5 to 10 lbs depending on how strict you are.   I suggest drinking 2 smoothies daily and keeping one chewable meal (but keep it simple, like baked fish and salad, rice or bok choy) .  You snack primarily on fresh  fruit, egg whites and judicious quantities of nuts. You can add vegetable based protein powder (nothing with whey , since whey is dairy) in it.  [employer] has a Research officer, trade uniongreat selection .   It does require some form of a nutrient extractor (Vita Mix, a electric juicer,  Or a Nutribullet Rx).  i have found that using frozen fruits is much more convenient and nore cost effective. You can even find plenty of organic fruit in the frozen fruit section of [employer].  Just thaw what you need for the following day the night before in the refrigerator (to avoid jamming up your machine)

## 2014-07-23 NOTE — Progress Notes (Signed)
Pre-visit discussion using our clinic review tool. No additional management support is needed unless otherwise documented below in the visit note.  

## 2014-07-23 NOTE — Progress Notes (Signed)
Subjective:  Patient ID: Lori Douglas, female    DOB: [birth year]  Age: 29 y.o. MRN: [id number]  CC: The encounter diagnosis was Insomnia due to psychological stress.  HPI Lori Douglas presents for follow up on post partum depression.  Recent trial of wellbutrin for management of symptoms of fatigue and poor concentration as not tolerated;  The medication  made her very anxious.  Primary symptoms now are pccasional insomnia which has improved with trazodone,  And is occurring once a week at most.  She continues to have occasioal episodes of anxiety  Which occur during the day and are situational.  NO panic attacks .  Previous episode of daily migraines resolved with improvement  In insomnia using 50 mg trazodone   Outpatient Prescriptions Prior to Visit  Medication Sig Dispense Refill  . calcipotriene-betamethasone (TACLONEX) ointment Apply 1 application topically daily.    . medroxyPROGESTERone (DEPO-PROVERA) 150 MG/ML injection Inject 150 mg into the muscle every 3 (three) months.     . traZODone (DESYREL) 50 MG tablet Take 0.5-1 tablets (25-50 mg total) by mouth at bedtime as needed for sleep. 30 tablet 3  . Prenatal Multivit-Min-Fe-FA (PRENATAL VITAMINS PO) Take 1 tablet by mouth daily.    . ranitidine (ZANTAC) 150 MG capsule Take 150 mg by mouth 2 (two) times daily.     Marland Kitchen buPROPion (WELLBUTRIN XL) 150 MG 24 hr tablet TAKE 1 TABLET BY MOUTH DAILY. (Patient not taking: Reported on 07/23/2014) 30 tablet 2   No facility-administered medications prior to visit.    Review of Systems;  Patient denies headache, fevers, malaise, unintentional weight loss, skin rash, eye pain, sinus congestion and sinus pain, sore throat, dysphagia,  hemoptysis , cough, dyspnea, wheezing, chest pain, palpitations, orthopnea, edema, abdominal pain, nausea, melena, diarrhea, constipation, flank pain, dysuria, hematuria, urinary  Frequency, nocturia, numbness, tingling, seizures,  Focal weakness, Loss of  consciousness,  Tremor, insomnia, depression, anxiety, and suicidal ideation.      Objective:  BP 104/64 mmHg  Pulse 77  Temp(Src) 98.1 F (36.7 C) (Oral)  Resp 14  Ht  (1.6 m)  Wt 135 lb 8 oz (61.462 kg)  BMI 24.01 kg/m2  SpO2 98%  BP Readings from Last 3 Encounters:  07/23/14 104/64  01/22/14 116/76  10/21/13 98/66    Wt Readings from Last 3 Encounters:  07/23/14 135 lb 8 oz (61.462 kg)  01/22/14 131 lb 8 oz (59.648 kg)  10/21/13 114 lb (51.71 kg)    General appearance: alert, cooperative and appears stated age Ears: normal TM's and external ear canals both ears Throat: lips, mucosa, and tongue normal; teeth and gums normal Neck: no adenopathy, no carotid bruit, supple, symmetrical, trachea midline and thyroid not enlarged, symmetric, no tenderness/mass/nodules Back: symmetric, no curvature. ROM normal. No CVA tenderness. Lungs: clear to auscultation bilaterally Heart: regular rate and rhythm, S1, S2 normal, no murmur, click, rub or gallop Abdomen: soft, non-tender; bowel sounds normal; no masses,  no organomegaly Pulses: 2+ and symmetric Skin: Skin color, texture, turgor normal. No rashes or lesions Lymph nodes: Cervical, supraclavicular, and axillary nodes normal.  No results found for: HGBA1C  Lab Results  Component Value Date   CREATININE 0.7 09/13/2013   CREATININE 0.94 06/24/2013   CREATININE 0.53* 04/27/2013    Lab Results  Component Value Date   WBC 4.9 09/13/2013   HGB 14.0 09/13/2013   HCT 41.6 09/13/2013   PLT 239.0 09/13/2013   GLUCOSE 83 09/13/2013   CHOL 91 09/13/2013  TRIG 31.0 09/13/2013   HDL 47.60 09/13/2013   LDLCALC 37 09/13/2013   ALT 29 09/13/2013   AST 21 09/13/2013   NA 139 09/13/2013   K 4.2 09/13/2013   CL 105 09/13/2013   CREATININE 0.7 09/13/2013   BUN 11 09/13/2013   CO2 28 09/13/2013   TSH 2.06 09/13/2013    No results found.  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  She averages one night per week of persistent insomnia .  Prn alprazolam         A total of 25 minutes of face to face time was spent with patient more than half of which was spent in counselling about the above mentioned conditions  and coordination of care  I have discontinued Ms. Verdi's buPROPion. I am also having her start on ALPRAZolam. Additionally, I am having her maintain her medroxyPROGESTERone, ranitidine, Prenatal Multivit-Min-Fe-FA (PRENATAL VITAMINS PO), calcipotriene-betamethasone, and traZODone.  Meds ordered this encounter  Medications  . ALPRAZolam (XANAX) 0.25 MG tablet    Sig: Take 1 tablet (0.25 mg total) by mouth at bedtime as needed for anxiety.    Dispense:  20 tablet    Refill:  0    Medications Discontinued During This Encounter  Medication Reason  . buPROPion (WELLBUTRIN XL) 150 MG 24 hr tablet   . buPROPion (WELLBUTRIN XL) 150 MG 24 hr tablet     Follow-up: No Follow-up on file.   Sherlene ShamsULLO, Abdishakur Gottschall L, MD

## 2014-07-25 NOTE — Assessment & Plan Note (Signed)
Improved with daily use of trazodone 50 mg.  She averages one night per week of persistent insomnia .  Prn alprazolam

## 2014-07-25 NOTE — Assessment & Plan Note (Signed)
Now resolved.  

## 2014-08-28 ENCOUNTER — Encounter: Payer: Self-pay | Admitting: Internal Medicine

## 2014-09-22 ENCOUNTER — Ambulatory Visit (INDEPENDENT_AMBULATORY_CARE_PROVIDER_SITE_OTHER): Payer: 59 | Admitting: Internal Medicine

## 2014-09-22 ENCOUNTER — Encounter: Payer: Self-pay | Admitting: Internal Medicine

## 2014-09-22 ENCOUNTER — Other Ambulatory Visit (HOSPITAL_COMMUNITY)
Admission: RE | Admit: 2014-09-22 | Discharge: 2014-09-22 | Disposition: A | Discharge status: Home / Self Care | Payer: 59 | Source: Ambulatory Visit | Attending: Internal Medicine | Admitting: Internal Medicine

## 2014-09-22 VITALS — BP 98/72 | HR 79 | Temp 98.3°F | Resp 12 | Ht 62.5 in | Wt 130.5 lb

## 2014-09-22 DIAGNOSIS — E785 Hyperlipidemia, unspecified: Secondary | ICD-10-CM

## 2014-09-22 DIAGNOSIS — Z01419 Encounter for gynecological examination (general) (routine) without abnormal findings: Secondary | ICD-10-CM | POA: Insufficient documentation

## 2014-09-22 DIAGNOSIS — Z113 Encounter for screening for infections with a predominantly sexual mode of transmission: Secondary | ICD-10-CM | POA: Diagnosis present

## 2014-09-22 DIAGNOSIS — N760 Acute vaginitis: Secondary | ICD-10-CM | POA: Diagnosis present

## 2014-09-22 DIAGNOSIS — N926 Irregular menstruation, unspecified: Secondary | ICD-10-CM | POA: Diagnosis not present

## 2014-09-22 DIAGNOSIS — B3731 Acute candidiasis of vulva and vagina: Secondary | ICD-10-CM

## 2014-09-22 DIAGNOSIS — I951 Orthostatic hypotension: Secondary | ICD-10-CM

## 2014-09-22 DIAGNOSIS — B373 Candidiasis of vulva and vagina: Secondary | ICD-10-CM

## 2014-09-22 DIAGNOSIS — Z1151 Encounter for screening for human papillomavirus (HPV): Secondary | ICD-10-CM | POA: Insufficient documentation

## 2014-09-22 LAB — CBC WITH DIFFERENTIAL/PLATELET
BASOS ABS: 0 10*3/uL (ref 0.0–0.1)
BASOS PCT: 0.9 % (ref 0.0–3.0)
Eosinophils Absolute: 0.2 10*3/uL (ref 0.0–0.7)
Eosinophils Relative: 3.4 % (ref 0.0–5.0)
HEMATOCRIT: 42.3 % (ref 36.0–46.0)
Hemoglobin: 14.2 g/dL (ref 12.0–15.0)
LYMPHS ABS: 1.7 10*3/uL (ref 0.7–4.0)
LYMPHS PCT: 37.3 % (ref 12.0–46.0)
MCHC: 33.7 g/dL (ref 30.0–36.0)
MCV: 87.5 fl (ref 78.0–100.0)
MONOS PCT: 5.8 % (ref 3.0–12.0)
Monocytes Absolute: 0.3 10*3/uL (ref 0.1–1.0)
NEUTROS ABS: 2.5 10*3/uL (ref 1.4–7.7)
NEUTROS PCT: 52.6 % (ref 43.0–77.0)
PLATELETS: 245 10*3/uL (ref 150.0–400.0)
RBC: 4.83 Mil/uL (ref 3.87–5.11)
RDW: 13.2 % (ref 11.5–15.5)
WBC: 4.7 10*3/uL (ref 4.0–10.5)

## 2014-09-22 LAB — COMPREHENSIVE METABOLIC PANEL
ALT: 13 U/L (ref 0–35)
AST: 15 U/L (ref 0–37)
Albumin: 4.5 g/dL (ref 3.5–5.2)
Alkaline Phosphatase: 73 U/L (ref 39–117)
BILIRUBIN TOTAL: 1.9 mg/dL — AB (ref 0.2–1.2)
BUN: 11 mg/dL (ref 6–23)
CO2: 26 meq/L (ref 19–32)
CREATININE: 0.76 mg/dL (ref 0.40–1.20)
Calcium: 9.3 mg/dL (ref 8.4–10.5)
Chloride: 105 mEq/L (ref 96–112)
GFR: 97.07 mL/min (ref >60.00)
GLUCOSE: 98 mg/dL (ref 70–99)
Potassium: 3.6 mEq/L (ref 3.5–5.1)
Sodium: 140 mEq/L (ref 135–145)
Total Protein: 7.1 g/dL (ref 6.0–8.3)

## 2014-09-22 LAB — LIPID PANEL
CHOL/HDL RATIO: 2
Cholesterol: 96 mg/dL (ref 0–200)
HDL: 54.3 mg/dL (ref >39.00)
LDL CALC: 23 mg/dL (ref 0–99)
NONHDL: 41.84
Triglycerides: 92 mg/dL (ref 0.0–149.0)
VLDL: 18.4 mg/dL (ref 0.0–40.0)

## 2014-09-22 LAB — POCT URINE PREGNANCY: PREG TEST UR: NEGATIVE

## 2014-09-22 LAB — FERRITIN: Ferritin: 44.1 ng/mL (ref 10.0–291.0)

## 2014-09-22 LAB — TSH: TSH: 2.12 u[IU]/mL (ref 0.35–4.50)

## 2014-09-22 MED ORDER — FLUCONAZOLE 150 MG PO TABS: 150.0000 mg | ORAL_TABLET | Freq: Every day | ORAL | Status: DC

## 2014-09-22 NOTE — Assessment & Plan Note (Addendum)
Chronic,  with prior use of IUD  And  oral contraceptives to regulate. Both were discontinued per patient preference. PAP smear STD screening and CBC were done given report of recent orthostasis and were all normal.   May need to consider using EE

## 2014-09-22 NOTE — Patient Instructions (Addendum)
Treating empicially for candida pending other tests   May need to start oral or transdermal estrogen

## 2014-09-22 NOTE — Progress Notes (Signed)
Pre-visit discussion using our clinic review tool. No additional management support is needed unless otherwise documented below in the visit note.  

## 2014-09-22 NOTE — Progress Notes (Signed)
Subjective:  Patient ID: Lori Douglas, female    DOB: 24-Aug-1985  Age: 29 y.o. MRN: [id number]  CC: The primary encounter diagnosis was Orthostasis. Diagnoses of Irregular menstrual bleeding, Screen for STD (sexually transmitted disease), Hyperlipidemia, Vaginitis and vulvovaginitis, and Candida vaginitis were also pertinent to this visit.  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  She has no history of dehydration but has had persistent menstrual bleeding since august 3rd. Her last dose of Depo Provera was in May, which was her 5th dose.  She describes the bleeding is not heavy, able to use panty liners but the chronic nature has caused her to feel vulvar irritation.   Outpatient Prescriptions Prior to Visit  Medication Sig Dispense Refill  . ALPRAZolam (XANAX) 0.25 MG tablet Take 1 tablet (0.25 mg total) by mouth at bedtime as needed for anxiety. 20 tablet 0  . calcipotriene-betamethasone (TACLONEX) ointment Apply 1 application topically daily.    . Prenatal Multivit-Min-Fe-FA (PRENATAL VITAMINS PO) Take 1 tablet by mouth daily.    . ranitidine (ZANTAC) 150 MG capsule Take 150 mg by mouth 2 (two) times daily.     . traZODone (DESYREL) 50 MG tablet Take 0.5-1 tablets (25-50 mg total) by mouth at bedtime as needed for sleep. 30 tablet 3  . medroxyPROGESTERone (DEPO-PROVERA) 150 MG/ML injection Inject 150 mg into the muscle every 3 (three) months.      No facility-administered medications prior to visit.    Review of Systems;  Patient denies headache, fevers, malaise, unintentional weight loss, skin rash, eye pain, sinus congestion and sinus pain, sore throat, dysphagia,  hemoptysis , cough, dyspnea, wheezing, chest pain, palpitations, orthopnea, edema, abdominal pain, nausea, melena, diarrhea, constipation, flank pain, dysuria, hematuria, urinary  Frequency,  nocturia, numbness, tingling, seizures,  Focal weakness, Loss of consciousness,  Tremor, insomnia, depression, anxiety, and suicidal ideation.      Objective:  BP 98/72 mmHg  Pulse 79  Temp(Src) 98.3 F (36.8 C) (Oral)  Resp 12  Ht 5' 2.5" (1.588 m)  Wt 130 lb 8 oz (59.194 kg)  BMI 23.47 kg/m2  SpO2 99%  LMP 08/04/2014  BP Readings from Last 3 Encounters:  09/22/14 98/72  07/23/14 104/64  01/22/14 116/76    Wt Readings from Last 3 Encounters:  09/22/14 130 lb 8 oz (59.194 kg)  07/23/14 135 lb 8 oz (61.462 kg)  01/22/14 131 lb 8 oz (59.648 kg)    General Appearance:    Alert, cooperative, no distress, appears stated age     Lungs:     Clear to auscultation bilaterally, respirations unlabored     Breast Exam:    No tenderness, masses, or nipple abnormality  Abdomen:     Soft, non-tender, bowel sounds active all four quadrants,    no masses, no organomegaly  Genitalia:    Pelvic: cervix normal in appearance, external genitalia normal, no adnexal masses or tenderness, no cervical motion tenderness, rectovaginal septum normal, uterus normal size, shape, and consistency and vagina normal without discharge  Extremities:   Extremities normal, atraumatic, no cyanosis or edema  Pulses:   2+ and symmetric all extremities  Skin:   Skin color, texture, turgor normal, no rashes or lesions        .   Lab Results  Component Value Date   CREATININE 0.76 09/22/2014   CREATININE 0.7 09/13/2013  CREATININE 0.94 06/24/2013    Lab Results  Component Value Date   WBC 4.7 09/22/2014   HGB 14.2 09/22/2014   HCT 42.3 09/22/2014   PLT 245.0 09/22/2014   GLUCOSE 98 09/22/2014   CHOL 96 09/22/2014   TRIG 92.0 09/22/2014   HDL 54.30 09/22/2014   LDLCALC 23 09/22/2014   ALT 13 09/22/2014   AST 15 09/22/2014   NA 140 09/22/2014   K 3.6 09/22/2014   CL 105 09/22/2014   CREATININE 0.76 09/22/2014   BUN 11 09/22/2014   CO2 26 09/22/2014   TSH 2.12 09/22/2014    No results  found.  Assessment & Plan:   Problem List Items Addressed This Visit      Unprioritized   Irregular menstrual bleeding    Chronic,  with prior use of IUD  And  oral contraceptives to regulate. Both were discontinued per patient preference. PAP smear STD screening and CBC were done given report of recent orthostasis and were all normal.   May need to consider using EE       Relevant Orders   Comprehensive metabolic panel (Completed)   TSH (Completed)   Cytology - PAP (Completed)   Iron and TIBC (Completed)   Ferritin (Completed)   POCT urine pregnancy (Completed)   Candida vaginitis    On recent pelvic exam, confirmed with PAP smear.       Relevant Medications   fluconazole (DIFLUCAN) 150 MG tablet    Other Visit Diagnoses    Orthostasis    -  Primary    Relevant Orders    CBC with Differential/Platelet (Completed)    Comprehensive metabolic panel (Completed)    TSH (Completed)    Screen for STD (sexually transmitted disease)        Relevant Orders    Hepatitis C antibody (Completed)    HIV antibody (Completed)    Hyperlipidemia        Relevant Orders    Lipid panel (Completed)    Vaginitis and vulvovaginitis           I am having Ms. Brannen start on fluconazole. I am also having her maintain her medroxyPROGESTERone, ranitidine, Prenatal Multivit-Min-Fe-FA (PRENATAL VITAMINS PO), calcipotriene-betamethasone, traZODone, and ALPRAZolam.  Meds ordered this encounter  Medications  . fluconazole (DIFLUCAN) 150 MG tablet    Sig: Take 1 tablet (150 mg total) by mouth daily.    Dispense:  2 tablet    Refill:  0    There are no discontinued medications.  Follow-up: No Follow-up on file.   Sherlene Shams, MD

## 2014-09-23 ENCOUNTER — Encounter: Payer: Self-pay | Admitting: Internal Medicine

## 2014-09-23 DIAGNOSIS — B373 Candidiasis of vulva and vagina: Secondary | ICD-10-CM | POA: Insufficient documentation

## 2014-09-23 DIAGNOSIS — B3731 Acute candidiasis of vulva and vagina: Secondary | ICD-10-CM | POA: Insufficient documentation

## 2014-09-23 LAB — IRON AND TIBC
%SAT: 38 % (ref 11–50)
Iron: 140 ug/dL (ref 40–190)
TIBC: 366 ug/dL (ref 250–450)
UIBC: 226 ug/dL (ref 125–400)

## 2014-09-23 LAB — HEPATITIS C ANTIBODY: HCV Ab: NEGATIVE

## 2014-09-23 LAB — HIV ANTIBODY (ROUTINE TESTING W REFLEX): HIV 1&2 Ab, 4th Generation: NONREACTIVE

## 2014-09-23 LAB — CYTOLOGY - PAP

## 2014-09-23 NOTE — Assessment & Plan Note (Addendum)
On recent pelvic exam, confirmed with PAP smear.

## 2014-09-24 LAB — CERVICOVAGINAL ANCILLARY ONLY: CANDIDA VAGINITIS: POSITIVE — AB

## 2014-09-26 LAB — CERVICOVAGINAL ANCILLARY ONLY: Herpes: NEGATIVE

## 2014-10-03 ENCOUNTER — Other Ambulatory Visit: Payer: Self-pay | Admitting: Internal Medicine

## 2014-10-22 ENCOUNTER — Ambulatory Visit: Payer: Self-pay | Admitting: Physician Assistant

## 2014-10-22 ENCOUNTER — Encounter: Payer: Self-pay | Admitting: Physician Assistant

## 2014-10-22 VITALS — BP 110/76 | Temp 99.1°F | Wt 130.0 lb

## 2014-10-22 DIAGNOSIS — J029 Acute pharyngitis, unspecified: Secondary | ICD-10-CM

## 2014-10-22 DIAGNOSIS — J069 Acute upper respiratory infection, unspecified: Secondary | ICD-10-CM

## 2014-10-22 LAB — POCT RAPID STREP A (OFFICE): RAPID STREP A SCREEN: NEGATIVE

## 2014-10-22 MED ORDER — AZITHROMYCIN 250 MG PO TABS: ORAL_TABLET | ORAL | Status: DC

## 2014-10-22 NOTE — Progress Notes (Signed)
S: C/o runny nose and congestion for 3 days, fever, chills, chest hurts from cough, throat is sore, a lot of nausea and some vomiting, denies cardiac cp/sob, cough is sporadic,   Using otc meds: robitussin  O: PE: low grade temp of 99;  perrl eomi, normocephalic, tms dull, nasal mucosa red and swollen, throat red and injected, neck supple no lymph, lungs c t a, cv rrr, neuro intact, q strep, flu swab neg  A:  Acute pharyngitis, acute uri   P: drink fluids, continue regular meds , use otc meds of choice, return if not improving in 5 days, return earlier if worsening  , zpack

## 2014-11-18 ENCOUNTER — Other Ambulatory Visit: Payer: Self-pay | Admitting: Internal Medicine

## 2015-02-06 ENCOUNTER — Encounter: Payer: Self-pay | Admitting: Internal Medicine

## 2015-02-06 ENCOUNTER — Ambulatory Visit: Payer: Self-pay | Admitting: Physician Assistant

## 2015-02-06 ENCOUNTER — Encounter: Payer: Self-pay | Admitting: Physician Assistant

## 2015-02-06 VITALS — BP 104/70 | Temp 98.3°F

## 2015-02-06 DIAGNOSIS — J018 Other acute sinusitis: Secondary | ICD-10-CM

## 2015-02-06 DIAGNOSIS — J209 Acute bronchitis, unspecified: Secondary | ICD-10-CM

## 2015-02-06 MED ORDER — PREDNISONE 10 MG PO TABS: 30.0000 mg | ORAL_TABLET | Freq: Every day | ORAL | Status: DC

## 2015-02-06 MED ORDER — HYDROCOD POLST-CPM POLST ER 10-8 MG/5ML PO SUER: 5.0000 mL | Freq: Two times a day (BID) | ORAL | Status: DC | PRN

## 2015-02-06 MED ORDER — CEFDINIR 300 MG PO CAPS
300.0000 mg | ORAL_CAPSULE | Freq: Two times a day (BID) | ORAL | Status: DC
Start: 2015-02-06 — End: 2015-06-29

## 2015-02-06 NOTE — Progress Notes (Signed)
S: C/o runny nose and congestion for 1 week, no fever, chills, cp/sob, v/d; mucus was green cough is deep and harsh, keeping her awake at night, children have been sick last 2 wekes,   O: PE: vitals wnl, nad, perrl eomi, normocephalic, tms dull, nasal mucosa red and swollen, throat injected, neck supple no lymph, lungs c t a, cv rrr, neuro intact  A:  Acute sinusitis, acute bronchitis   P: omnicef, tussionex, pred  ;  drink fluids, continue regular meds , use otc meds of choice, return if not improving in 5 days, return earlier if worsening

## 2015-02-17 ENCOUNTER — Other Ambulatory Visit: Payer: Self-pay | Admitting: Internal Medicine

## 2015-02-17 NOTE — Telephone Encounter (Signed)
Ok to refill,  Refill printed 

## 2015-02-17 NOTE — Telephone Encounter (Signed)
Ok to fill 

## 2015-02-26 ENCOUNTER — Emergency Department
Admission: EM | Admit: 2015-02-26 | Discharge: 2015-02-26 | Disposition: A | Discharge status: Home / Self Care | Payer: 59 | Attending: Emergency Medicine | Admitting: Emergency Medicine

## 2015-02-26 ENCOUNTER — Emergency Department: Payer: 59

## 2015-02-26 ENCOUNTER — Encounter: Payer: Self-pay | Admitting: Emergency Medicine

## 2015-02-26 DIAGNOSIS — Z792 Long term (current) use of antibiotics: Secondary | ICD-10-CM | POA: Insufficient documentation

## 2015-02-26 DIAGNOSIS — Z7952 Long term (current) use of systemic steroids: Secondary | ICD-10-CM | POA: Insufficient documentation

## 2015-02-26 DIAGNOSIS — S161XXA Strain of muscle, fascia and tendon at neck level, initial encounter: Secondary | ICD-10-CM | POA: Diagnosis not present

## 2015-02-26 DIAGNOSIS — Y998 Other external cause status: Secondary | ICD-10-CM | POA: Diagnosis not present

## 2015-02-26 DIAGNOSIS — S80211A Abrasion, right knee, initial encounter: Secondary | ICD-10-CM | POA: Insufficient documentation

## 2015-02-26 DIAGNOSIS — Z791 Long term (current) use of non-steroidal anti-inflammatories (NSAID): Secondary | ICD-10-CM | POA: Diagnosis not present

## 2015-02-26 DIAGNOSIS — Y92410 Unspecified street and highway as the place of occurrence of the external cause: Secondary | ICD-10-CM | POA: Insufficient documentation

## 2015-02-26 DIAGNOSIS — S4992XA Unspecified injury of left shoulder and upper arm, initial encounter: Secondary | ICD-10-CM | POA: Insufficient documentation

## 2015-02-26 DIAGNOSIS — Z7982 Long term (current) use of aspirin: Secondary | ICD-10-CM | POA: Diagnosis not present

## 2015-02-26 DIAGNOSIS — S3991XA Unspecified injury of abdomen, initial encounter: Secondary | ICD-10-CM | POA: Diagnosis not present

## 2015-02-26 DIAGNOSIS — T24122A Burn of first degree of left knee, initial encounter: Secondary | ICD-10-CM | POA: Diagnosis not present

## 2015-02-26 DIAGNOSIS — T24129A Burn of first degree of unspecified knee, initial encounter: Secondary | ICD-10-CM

## 2015-02-26 DIAGNOSIS — Y9389 Activity, other specified: Secondary | ICD-10-CM | POA: Diagnosis not present

## 2015-02-26 DIAGNOSIS — M546 Pain in thoracic spine: Secondary | ICD-10-CM | POA: Diagnosis not present

## 2015-02-26 DIAGNOSIS — S80212A Abrasion, left knee, initial encounter: Secondary | ICD-10-CM | POA: Diagnosis not present

## 2015-02-26 DIAGNOSIS — Z79899 Other long term (current) drug therapy: Secondary | ICD-10-CM | POA: Insufficient documentation

## 2015-02-26 DIAGNOSIS — T24121A Burn of first degree of right knee, initial encounter: Secondary | ICD-10-CM | POA: Insufficient documentation

## 2015-02-26 DIAGNOSIS — Z880 Allergy status to penicillin: Secondary | ICD-10-CM | POA: Insufficient documentation

## 2015-02-26 DIAGNOSIS — S29019A Strain of muscle and tendon of unspecified wall of thorax, initial encounter: Secondary | ICD-10-CM

## 2015-02-26 DIAGNOSIS — M542 Cervicalgia: Secondary | ICD-10-CM | POA: Diagnosis not present

## 2015-02-26 DIAGNOSIS — S29012A Strain of muscle and tendon of back wall of thorax, initial encounter: Secondary | ICD-10-CM | POA: Diagnosis not present

## 2015-02-26 DIAGNOSIS — S199XXA Unspecified injury of neck, initial encounter: Secondary | ICD-10-CM | POA: Diagnosis present

## 2015-02-26 MED ORDER — CYCLOBENZAPRINE HCL 10 MG PO TABS: 10.0000 mg | ORAL_TABLET | Freq: Three times a day (TID) | ORAL | Status: DC | PRN

## 2015-02-26 MED ORDER — NAPROXEN 500 MG PO TABS: 500.0000 mg | ORAL_TABLET | Freq: Two times a day (BID) | ORAL | Status: DC
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
## 2015-02-26 NOTE — Discharge Instructions (Signed)
Burn Care Your skin is a natural barrier to infection. It is the largest organ of your body. Burns damage this natural protection. To help prevent infection, it is very important to follow your caregiver's instructions in the care of your burn. Burns are classified as:  First degree. There is only redness of the skin (erythema). No scarring is expected.  Second degree. There is blistering of the skin. Scarring may occur with deeper burns.  Third degree. All layers of the skin are injured, and scarring is expected. HOME CARE INSTRUCTIONS   Wash your hands well before changing your bandage.  Change your bandage as often as directed by your caregiver.  Remove the old bandage. If the bandage sticks, you may soak it off with cool, clean water.  Cleanse the burn thoroughly but gently with mild soap and water.  Pat the area dry with a clean, dry cloth.  Apply a thin layer of antibacterial cream to the burn.  Apply a clean bandage as instructed by your caregiver.  Keep the bandage as clean and dry as possible.  Elevate the affected area for the first 24 hours, then as instructed by your caregiver.  Only take over-the-counter or prescription medicines for pain, discomfort, or fever as directed by your caregiver. SEEK IMMEDIATE MEDICAL CARE IF:   You develop excessive pain.  You develop redness, tenderness, swelling, or red streaks near the burn.  The burned area develops yellowish-white fluid (pus) or a bad smell.  You have a fever. MAKE SURE YOU:   Understand these instructions.  Will watch your condition.  Will get help right away if you are not doing well or get worse.   This information is not intended to replace advice given to you by your health care provider. Make sure you discuss any questions you have with your health care provider.   Document Released: 12/20/2004 Document Revised: 03/14/2011 Document Reviewed: 05/12/2010 Elsevier Interactive Patient Education 2016  [employer].  Burn Care Burns hurt your skin. When your skin is hurt, it is easier to get an infection. Follow your doctor's directions to help prevent an infection. HOME CARE  Wash your hands well before you change your bandage.  Change your bandage as often as told by your doctor.  Remove the old bandage. If the bandage sticks, soak it off with cool, clean water.  Gently clean the burn with mild soap and water.  Pat the burn dry with a clean, dry cloth.  Put a thin layer of medicated cream on the burn.  Put a clean bandage on as told by your doctor.  Keep the bandage clean and dry.  Raise (elevate) the burn for the first 24 hours. After that, follow your doctor's directions.  Only take medicine as told by your doctor. GET HELP RIGHT AWAY IF:   You have too much pain.  The skin near the burn is red, tender, puffy (swollen), or has red streaks.  The burn area has yellowish white fluid (pus) or a bad smell coming from it.  You have a fever. MAKE SURE YOU:   Understand these instructions.  Will watch your condition.  Will get help right away if you are not doing well or get worse.   This information is not intended to replace advice given to you by your health care provider. Make sure you discuss any questions you have with your health care provider.   Document Released: 09/29/2007 Document Revised: 03/14/2011 Document Reviewed: 05/12/2010 Elsevier Interactive Patient Education 2016  [employer] Inc[employer] ° °
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
## 2015-03-11 ENCOUNTER — Encounter: Payer: Self-pay | Admitting: Internal Medicine

## 2015-04-09 DIAGNOSIS — H5203 Hypermetropia, bilateral: Secondary | ICD-10-CM | POA: Diagnosis not present

## 2015-05-14 ENCOUNTER — Other Ambulatory Visit: Payer: Self-pay | Admitting: Internal Medicine

## 2015-06-25 ENCOUNTER — Encounter: Payer: Self-pay | Admitting: Internal Medicine

## 2015-06-26 ENCOUNTER — Telehealth: Payer: Self-pay | Admitting: *Deleted

## 2015-06-26 ENCOUNTER — Ambulatory Visit
Admission: RE | Admit: 2015-06-26 | Discharge: 2015-06-26 | Disposition: A | Discharge status: Home / Self Care | Payer: 59 | Source: Ambulatory Visit | Attending: Internal Medicine | Admitting: Internal Medicine

## 2015-06-26 ENCOUNTER — Telehealth: Payer: Self-pay | Admitting: Internal Medicine

## 2015-06-26 ENCOUNTER — Other Ambulatory Visit (INDEPENDENT_AMBULATORY_CARE_PROVIDER_SITE_OTHER): Payer: 59

## 2015-06-26 DIAGNOSIS — R109 Unspecified abdominal pain: Secondary | ICD-10-CM

## 2015-06-26 DIAGNOSIS — R300 Dysuria: Secondary | ICD-10-CM

## 2015-06-26 DIAGNOSIS — Z9049 Acquired absence of other specified parts of digestive tract: Secondary | ICD-10-CM | POA: Insufficient documentation

## 2015-06-26 DIAGNOSIS — R319 Hematuria, unspecified: Secondary | ICD-10-CM | POA: Diagnosis not present

## 2015-06-26 LAB — POCT URINALYSIS DIPSTICK
Bilirubin, UA: NEGATIVE
GLUCOSE UA: NEGATIVE
KETONES UA: NEGATIVE
LEUKOCYTES UA: NEGATIVE
Nitrite, UA: NEGATIVE
PROTEIN UA: NEGATIVE
Spec Grav, UA: 1.015
Urobilinogen, UA: 0.2
pH, UA: 6.5

## 2015-06-26 LAB — URINALYSIS, ROUTINE W REFLEX MICROSCOPIC
Bilirubin Urine: NEGATIVE
Ketones, ur: NEGATIVE
LEUKOCYTES UA: NEGATIVE
Nitrite: NEGATIVE
SPECIFIC GRAVITY, URINE: 1.015 (ref 1.000–1.030)
TOTAL PROTEIN, URINE-UPE24: NEGATIVE
URINE GLUCOSE: NEGATIVE
UROBILINOGEN UA: 0.2 (ref 0.0–1.0)
pH: 6.5 (ref 5.0–8.0)

## 2015-06-26 NOTE — Telephone Encounter (Signed)
I have scheduled patient for Monday 5.30 possible UTI cannot reach patient by phone she is at work today. Advise if this is Ok.

## 2015-06-26 NOTE — Telephone Encounter (Signed)
Responding to her email:   Can she come in at 1:00 today?   It might be a kidney stone.  I'll check your urine and this will give us time to get a CT scan if I think it's needed.

## 2015-06-26 NOTE — Addendum Note (Signed)
Addended by: Sherlene ShamsULLO, Jillianna Stanek L on: 06/26/2015 03:43 PM   Modules accepted: Orders

## 2015-06-26 NOTE — Telephone Encounter (Signed)
Wrong encounter. 

## 2015-06-26 NOTE — Addendum Note (Signed)
Addended by: Felix AhmadiFRANSEN, Milarose Savich A on: 06/26/2015 02:09 PM   Modules accepted: Orders

## 2015-06-26 NOTE — Telephone Encounter (Signed)
Patient scheduled for lab 

## 2015-06-28 ENCOUNTER — Encounter: Payer: Self-pay | Admitting: Internal Medicine

## 2015-06-28 LAB — URINE CULTURE

## 2015-06-29 ENCOUNTER — Ambulatory Visit (INDEPENDENT_AMBULATORY_CARE_PROVIDER_SITE_OTHER): Payer: 59 | Admitting: Internal Medicine

## 2015-06-29 ENCOUNTER — Encounter: Payer: Self-pay | Admitting: Internal Medicine

## 2015-06-29 VITALS — BP 114/76 | HR 106 | Temp 98.1°F | Resp 15 | Ht 62.5 in | Wt 129.8 lb

## 2015-06-29 DIAGNOSIS — R1030 Lower abdominal pain, unspecified: Secondary | ICD-10-CM | POA: Diagnosis not present

## 2015-06-29 DIAGNOSIS — R1032 Left lower quadrant pain: Secondary | ICD-10-CM | POA: Diagnosis not present

## 2015-06-29 NOTE — Progress Notes (Signed)
Pre visit review using our clinic review tool, if applicable. No additional management support is needed unless otherwise documented below in the visit note. 

## 2015-06-29 NOTE — Assessment & Plan Note (Addendum)
Exam consistent with enlarging ovarian follicle. Pain is not severe enough to consider torsion, and there is no discharge  Or cervical motion tenderness on exam. CBC is normal and CT stone protocol was negative for stones. .  Advised to use motirn and tylenol,    If symptoms escalate addtional imaging will be needed .  Lab Results  Component Value Date   WBC 6.3 06/29/2015   HGB 13.5 06/29/2015   HCT 40.4 06/29/2015   MCV 87.7 06/29/2015   PLT 249.0 06/29/2015

## 2015-06-29 NOTE — Progress Notes (Signed)
Subjective:  Patient ID: Lori Douglas, female    DOB: Jan 06, 1985  Age: 30 y.o. MRN: [id number]  CC: The primary encounter diagnosis was LLQ pain. A diagnosis of Left inguinal pain was also pertinent to this visit.  HPI Lori Douglas presents for persistent left lower quadrant pain.  The pain started on the left lower quadrant in the inguinal area  Last Wednesday,  She woke  up with it last Wednesday morning, accompanied by low back pain (feeling like pressure) and mild nausea .  Francis Dowse/she was evaluated for UTI and stones with normal UA and CT stoen protocol which was normal on Friday.  On Saturday had subjective chills but no fever (temp was checked and she was not taking NSAIDs or analgesics) and Sunday felt achey and generally poor,  Stayed in bed most of the day,  And states that the pain has been intermittent and crampy.   pain moved from left side to right on Friday night.   BMs have been daily   And solid,  Drinking lots of water .  Still has appendix .  No GB.  Finihed her period last Monday,  Has not had sexual intercourse since before her period.o vaginal discharge   PELVIXc exam normal appearing.  Tender on the left side ovary not enlarged.   Outpatient Prescriptions Prior to Visit  Medication Sig Dispense Refill  . ALPRAZolam (XANAX) 0.25 MG tablet TAKE 1 TABLET BY MOUTH NIGHTLY AT BEDTIME AS NEEDED FOR SLEEP 20 tablet 0  . Prenatal Multivit-Min-Fe-FA (PRENATAL VITAMINS PO) Take 1 tablet by mouth daily. Reported on 02/06/2015    . traZODone (DESYREL) 50 MG tablet TAKE 1/2 TO 1 TABLET BY MOUTH AT BEDTIME AS NEEDED FOR SLEEP. 30 tablet 3  . aspirin EC 325 MG tablet Take 325 mg by mouth daily. Reported on 06/29/2015    . cyclobenzaprine (FLEXERIL) 10 MG tablet Take 1 tablet (10 mg total) by mouth 3 (three) times daily as needed for muscle spasms. (Patient not taking: Reported on 06/29/2015) 30 tablet 0  . medroxyPROGESTERone (DEPO-PROVERA) 150 MG/ML injection Inject 150 mg into the muscle  every 3 (three) months. Reported on 06/29/2015    . ranitidine (ZANTAC) 150 MG capsule Take 150 mg by mouth 2 (two) times daily. Reported on 06/29/2015    . azithromycin (ZITHROMAX Z-PAK) 250 MG tablet 2 pills today then 1 pill per day for 4 days (Patient not taking: Reported on 02/06/2015) 6 each 0  . calcipotriene-betamethasone (TACLONEX) ointment Apply 1 application topically daily. Reported on 02/06/2015    . cefdinir (OMNICEF) 300 MG capsule Take 1 capsule (300 mg total) by mouth 2 (two) times daily. 20 capsule 0  . chlorpheniramine-HYDROcodone (TUSSIONEX PENNKINETIC ER) 10-8 MG/5ML SUER Take 5 mLs by mouth every 12 (twelve) hours as needed for cough. 150 mL 0  . fexofenadine (ALLEGRA) 180 MG tablet Take 180 mg by mouth daily. Reported on 02/06/2015    . fluconazole (DIFLUCAN) 150 MG tablet Take 1 tablet (150 mg total) by mouth daily. (Patient not taking: Reported on 10/22/2014) 2 tablet 0  . naproxen (NAPROSYN) 500 MG tablet Take 1 tablet (500 mg total) by mouth 2 (two) times daily with a meal. 60 tablet 0  . predniSONE (DELTASONE) 10 MG tablet Take 3 tablets (30 mg total) by mouth daily with breakfast. 9 tablet 0   No facility-administered medications prior to visit.    Review of Systems;  Patient denies headache, fevers, malaise, unintentional weight loss, skin rash,  eye pain, sinus congestion and sinus pain, sore throat, dysphagia,  hemoptysis , cough, dyspnea, wheezing, chest pain, palpitations, orthopnea, edema, abdominal pain, nausea, melena, diarrhea, constipation, flank pain, dysuria, hematuria, urinary  Frequency, nocturia, numbness, tingling, seizures,  Focal weakness, Loss of consciousness,  Tremor, insomnia, depression, anxiety, and suicidal ideation.      Objective:  BP 114/76 mmHg  Pulse 106  Temp(Src) 98.1 F (36.7 C) (Oral)  Resp 15  Ht 5' 2.5" (1.588 m)  Wt 129 lb 12.8 oz (58.877 kg)  BMI 23.35 kg/m2  SpO2 98%  BP Readings from Last 3 Encounters:  06/29/15 114/76    02/26/15 132/90  02/06/15 104/70    Wt Readings from Last 3 Encounters:  06/29/15 129 lb 12.8 oz (58.877 kg)  02/26/15 130 lb (58.968 kg)  10/22/14 130 lb (58.968 kg)    General appearance: alert, cooperative and appears stated age Ears: normal TM's and external ear canals both ears Throat: lips, mucosa, and tongue normal; teeth and gums normal Neck: no adenopathy, no carotid bruit, supple, symmetrical, trachea midline and thyroid not enlarged, symmetric, no tenderness/mass/nodules Back: symmetric, no curvature. ROM normal. No CVA tenderness. Lungs: clear to auscultation bilaterally Heart: regular rate and rhythm, S1, S2 normal, no murmur, click, rub or gallop Abdomen: soft, non-tender; bowel sounds normal; no masses,  no organomegaly GYN: normal vaginal vault, no discharge or CMT; tender on bimanual exam over left ovary. No mass appreciated. .  Pulses: 2+ and symmetric Skin: Skin color, texture, turgor normal. No rashes or lesions Lymph nodes: Cervical, supraclavicular, and axillary nodes normal.   Ct Abdomen Pelvis Wo Contrast  06/26/2015  CLINICAL DATA:  Left lower quadrant pain radiating to the back for 2 days. Hematuria. EXAM: CT ABDOMEN AND PELVIS WITHOUT CONTRAST TECHNIQUE: Multidetector CT imaging of the abdomen and pelvis was performed following the standard protocol without IV contrast. COMPARISON:  None. FINDINGS: Lung bases:  Clear.  Heart normal size. Hepatobiliary: Normal liver. Gallbladder surgically absent. No bile duct dilation. Spleen, pancreas, adrenal glands:  Normal. Kidneys, ureters, bladder:  Normal. Uterus and adnexa:  Normal. Lymph nodes:  No adenopathy. Ascites:  None. Gastrointestinal:  Normal.  Normal appendix visualized. Musculoskeletal:  Normal. IMPRESSION: 1. No acute findings in the abdomen pelvis. No findings to explain the patient's symptoms. No renal or ureteral stones or obstructive uropathy. 2. Status post cholecystectomy.  No other abnormalities.  Electronically Signed   By: Amie Portland M.D.   On: 06/26/2015 16:27    Assessment & Plan:   Problem List Items Addressed This Visit    Left inguinal pain    Exam consistent with enlarging ovarian follicle. Pain is not severe enough to consider torsion, and there is no discharge  Or cervical motion tenderness on exam. CBC is normal and CT stone protocol was negative for stones. .  Advised to use motirn and tylenol,    If symptoms escalate addtional imaging will be needed .  Lab Results  Component Value Date   WBC 6.3 06/29/2015   HGB 13.5 06/29/2015   HCT 40.4 06/29/2015   MCV 87.7 06/29/2015   PLT 249.0 06/29/2015          Other Visit Diagnoses    LLQ pain    -  Primary    Relevant Orders    CBC with Differential/Platelet (Completed)       I have discontinued Ms. Vanderburg's calcipotriene-betamethasone, fluconazole, fexofenadine, azithromycin, cefdinir, predniSONE, chlorpheniramine-HYDROcodone, and naproxen. I am also having her maintain her medroxyPROGESTERone, ranitidine,  Prenatal Multivit-Min-Fe-FA (PRENATAL VITAMINS PO), aspirin EC, traZODone, cyclobenzaprine, and ALPRAZolam.  No orders of the defined types were placed in this encounter.    Medications Discontinued During This Encounter  Medication Reason  . azithromycin (ZITHROMAX Z-PAK) 250 MG tablet Error  . calcipotriene-betamethasone (TACLONEX) ointment Error  . cefdinir (OMNICEF) 300 MG capsule Error  . chlorpheniramine-HYDROcodone (TUSSIONEX PENNKINETIC ER) 10-8 MG/5ML SUER Error  . fexofenadine (ALLEGRA) 180 MG tablet Error  . fluconazole (DIFLUCAN) 150 MG tablet Error  . naproxen (NAPROSYN) 500 MG tablet Error  . predniSONE (DELTASONE) 10 MG tablet Error    Follow-up: No Follow-up on file.   Sherlene ShamsULLO, Thaddius Manes L, MD

## 2015-06-29 NOTE — Patient Instructions (Signed)
You pain appears to be coming form your left ovary,  Which may have an enlarging follicle  Use motrin and tylenol as needed  If the pain becomes more severe or if you develop diarrhea,  Fevers,  Or blood in urine/stool ,  CALL

## 2015-06-30 ENCOUNTER — Encounter: Payer: Self-pay | Admitting: Internal Medicine

## 2015-06-30 LAB — CBC WITH DIFFERENTIAL/PLATELET
BASOS PCT: 0.8 % (ref 0.0–3.0)
Basophils Absolute: 0 10*3/uL (ref 0.0–0.1)
EOS PCT: 1.6 % (ref 0.0–5.0)
Eosinophils Absolute: 0.1 10*3/uL (ref 0.0–0.7)
HCT: 40.4 % (ref 36.0–46.0)
Hemoglobin: 13.5 g/dL (ref 12.0–15.0)
LYMPHS ABS: 1.8 10*3/uL (ref 0.7–4.0)
Lymphocytes Relative: 28.7 % (ref 12.0–46.0)
MCHC: 33.5 g/dL (ref 30.0–36.0)
MCV: 87.7 fl (ref 78.0–100.0)
MONOS PCT: 5 % (ref 3.0–12.0)
Monocytes Absolute: 0.3 10*3/uL (ref 0.1–1.0)
NEUTROS ABS: 4.1 10*3/uL (ref 1.4–7.7)
NEUTROS PCT: 63.9 % (ref 43.0–77.0)
Platelets: 249 10*3/uL (ref 150.0–400.0)
RBC: 4.6 Mil/uL (ref 3.87–5.11)
RDW: 13.4 % (ref 11.5–15.5)
WBC: 6.3 10*3/uL (ref 4.0–10.5)

## 2015-06-30 NOTE — Addendum Note (Signed)
Addended by: Sherlene ShamsULLO, Jaxon Flatt L on: 06/30/2015 09:10 PM   Modules accepted: Level of Service

## 2015-07-17 ENCOUNTER — Telehealth: Payer: Self-pay

## 2015-07-17 DIAGNOSIS — N6310 Unspecified lump in the right breast, unspecified quadrant: Secondary | ICD-10-CM

## 2015-07-17 NOTE — Telephone Encounter (Signed)
[facility] at [facility]. Spoke with Asher MuirJamie. Explained situation. She advised to just order Right breast Ultrasound as patient is less than 30 years old. Order placed.  Pain is directly under Nipple Aerolar Complex. No discharge. No inversion. No skin changes. States that her grandmother had Breast Cancer and this makes her worried about the situation.

## 2015-07-17 NOTE — Telephone Encounter (Signed)
Returned phone call to [facility] at this time. They closed at 1500 and I was unable to speak with anyone. Message left at this time for return phone call to get this scheduled.

## 2015-07-20 NOTE — Telephone Encounter (Signed)
Lori Douglas returned phone call this am. Appointment for patient's Ultrasound scheduled for Wednesday 7/26 at 0920am.  Called patient. Appointment information for Ultrasound given and follow-up appointment in office scheduled with Dr. Everlene FarrierPabon. Patient had no preference per provider.

## 2015-07-29 ENCOUNTER — Other Ambulatory Visit: Payer: Self-pay

## 2015-07-29 ENCOUNTER — Ambulatory Visit
Admission: RE | Admit: 2015-07-29 | Discharge: 2015-07-29 | Disposition: A | Discharge status: Home / Self Care | Payer: 59 | Source: Ambulatory Visit | Attending: General Surgery | Admitting: General Surgery

## 2015-07-29 DIAGNOSIS — N63 Unspecified lump in breast: Secondary | ICD-10-CM | POA: Insufficient documentation

## 2015-07-29 DIAGNOSIS — N644 Mastodynia: Secondary | ICD-10-CM | POA: Diagnosis not present

## 2015-07-29 DIAGNOSIS — N6320 Unspecified lump in the left breast, unspecified quadrant: Secondary | ICD-10-CM

## 2015-07-29 DIAGNOSIS — N6310 Unspecified lump in the right breast, unspecified quadrant: Secondary | ICD-10-CM

## 2015-07-31 ENCOUNTER — Ambulatory Visit: Payer: Self-pay | Admitting: Surgery

## 2015-08-05 ENCOUNTER — Other Ambulatory Visit: Payer: Self-pay | Admitting: Internal Medicine

## 2015-08-05 NOTE — Telephone Encounter (Signed)
Last OV 06/29/15... Last refill 05/14/15 #20 with 0 refills... Okay to refill?

## 2015-08-06 ENCOUNTER — Other Ambulatory Visit: Payer: Self-pay | Admitting: Internal Medicine

## 2015-09-04 ENCOUNTER — Encounter: Payer: Self-pay | Admitting: Internal Medicine

## 2015-09-08 ENCOUNTER — Other Ambulatory Visit: Payer: Self-pay | Admitting: Internal Medicine

## 2015-09-08 MED ORDER — PROMETHAZINE HCL 12.5 MG RE SUPP: 12.5000 mg | Freq: Four times a day (QID) | RECTAL | 0 refills | Status: DC | PRN

## 2015-09-08 NOTE — Progress Notes (Signed)
rometh

## 2015-09-14 DIAGNOSIS — L400 Psoriasis vulgaris: Secondary | ICD-10-CM | POA: Diagnosis not present

## 2015-09-15 ENCOUNTER — Encounter: Payer: Self-pay | Admitting: Internal Medicine

## 2015-09-15 ENCOUNTER — Ambulatory Visit (INDEPENDENT_AMBULATORY_CARE_PROVIDER_SITE_OTHER): Payer: 59 | Admitting: Internal Medicine

## 2015-09-15 DIAGNOSIS — L409 Psoriasis, unspecified: Secondary | ICD-10-CM | POA: Diagnosis not present

## 2015-09-15 DIAGNOSIS — R51 Headache: Secondary | ICD-10-CM | POA: Diagnosis not present

## 2015-09-15 DIAGNOSIS — R519 Headache, unspecified: Secondary | ICD-10-CM

## 2015-09-15 DIAGNOSIS — F5102 Adjustment insomnia: Secondary | ICD-10-CM

## 2015-09-15 MED ORDER — BUTALBITAL-APAP-CAFFEINE 50-325-40 MG PO TABS: 1.0000 | ORAL_TABLET | Freq: Four times a day (QID) | ORAL | 0 refills | Status: DC | PRN

## 2015-09-15 MED ORDER — AMITRIPTYLINE HCL 25 MG PO TABS: 25.0000 mg | ORAL_TABLET | Freq: Every day | ORAL | 1 refills | Status: DC

## 2015-09-15 NOTE — Patient Instructions (Signed)
The frequency of your headaches may suggest an overlap of migraines and "medication rebound" syndrome from using NSAIDs so frequently  (from taking ibuprofen so often,  Your brain withdraws from it)   I am recommending a trial of Elavil at night to prevent headaches .  Start with 25 mg and increase weekly by 25 mg.  Avoid taking unless you have 6 hours of sleep time uninterrupted.    The fioricet  Is to be used to TREAT  A headache.   It does NOT contain an NSAID    both medications may cause constipation.

## 2015-09-15 NOTE — Progress Notes (Signed)
Subjective:  Patient ID: Lori Douglas, female    DOB: 03/08/1985  Age: 30 y.o. MRN: [id number]  CC: Diagnoses of Insomnia due to psychological stress, Headache, chronic daily, and Psoriasis were pertinent to this visit.  HPI Lori Douglas presents for follow up , lasgt seen in June for LLQ radiating to back  Ct w/o constrast and CBC were normal.   Saw dermatologist at [facility] for psoriasis who recommended  [facility] [place].  She is concerned about  The potential S/E of depression and increased risk of lymphoma   History of migraines in high school.  Headaches have been occurring more frequently ,  Several times per week,  Taking ibuprofen several times per week,  No aspirin,  Has noticed some bruising. Since increased intake of ibuprofen which as improved since she cut back .   Last one was severe,  With Noise and light sensitivity, and  Nausea .    Slept it off after everything otc failed.  Up to date on eye exams.  Has had 2 true migraines in the past month and 3 or 4 other tension type headaches per week.    Some triggers inlude weather, sinus infections.     Outpatient Medications Prior to Visit  Medication Sig Dispense Refill  . ALPRAZolam (XANAX) 0.25 MG tablet TAKE 1 TABLET BY MOUTH NIGHTLY AT BEDTIME AS NEEDED FOR SLEEP 20 tablet 0  . traZODone (DESYREL) 50 MG tablet TAKE 1/2 TO 1 TABLET BY MOUTH AT BEDTIME AS NEEDED FOR SLEEP. 30 tablet 5  . aspirin EC 325 MG tablet Take 325 mg by mouth daily. Reported on 06/29/2015    . cyclobenzaprine (FLEXERIL) 10 MG tablet Take 1 tablet (10 mg total) by mouth 3 (three) times daily as needed for muscle spasms. (Patient not taking: Reported on 06/29/2015) 30 tablet 0  . medroxyPROGESTERone (DEPO-PROVERA) 150 MG/ML injection Inject 150 mg into the muscle every 3 (three) months. Reported on 06/29/2015    . Prenatal Multivit-Min-Fe-FA (PRENATAL VITAMINS PO) Take 1 tablet by mouth daily. Reported on 02/06/2015    . promethazine (PHENERGAN) 12.5 MG  suppository Place 1 suppository (12.5 mg total) rectally every 6 (six) hours as needed for nausea or vomiting. (Patient not taking: Reported on 09/15/2015) 12 each 0  . ranitidine (ZANTAC) 150 MG capsule Take 150 mg by mouth 2 (two) times daily. Reported on 06/29/2015     No facility-administered medications prior to visit.     Review of Systems;  Patient denies , fevers, malaise, unintentional weight loss, , eye pain, sinus congestion and sinus pain, sore throat, dysphagia,  hemoptysis , cough, dyspnea, wheezing, chest pain, palpitations, orthopnea, edema, abdominal pain, nausea, melena, diarrhea, constipation, flank pain, dysuria, hematuria, urinary  Frequency, nocturia, numbness, tingling, seizures,  Focal weakness, Loss of consciousness,  Tremor,  depression, anxiety, and suicidal ideation.      Objective:  BP 126/78 (BP Location: Right Arm, Patient Position: Sitting, Cuff Size: Normal)   Pulse 89   Temp 98 F (36.7 C)   Resp 16   Ht 5\' 3"  (1.6 m)   Wt 136 lb (61.7 kg)   LMP 08/23/2015 (Within Weeks)   SpO2 99%   BMI 24.09 kg/m   BP Readings from Last 3 Encounters:  09/15/15 126/78  06/29/15 114/76  02/26/15 132/90    Wt Readings from Last 3 Encounters:  09/15/15 136 lb (61.7 kg)  06/29/15 129 lb 12.8 oz (58.9 kg)  02/26/15 130 lb (59 kg)  General appearance: alert, cooperative and appears stated age Ears: normal TM's and external ear canals both ears Throat: lips, mucosa, and tongue normal; teeth and gums normal Neck: no adenopathy, no carotid bruit, supple, symmetrical, trachea midline and thyroid not enlarged, symmetric, no tenderness/mass/nodules Back: symmetric, no curvature. ROM normal. No CVA tenderness. Lungs: clear to auscultation bilaterally Heart: regular rate and rhythm, S1, S2 normal, no murmur, click, rub or gallop Abdomen: soft, non-tender; bowel sounds normal; no masses,  no organomegaly Pulses: 2+ and symmetric Skin: Skin color, texture, turgor  normal. No rashes or lesions Lymph nodes: Cervical, supraclavicular, and axillary nodes normal. Neuro: CNs 2-12 intact. DTRs 2+/4 in biceps, brachioradialis, patellars and achilles. Muscle strength 5/5 in upper and lower exremities. Fine resting tremor bilaterally both hands cerebellar function normal. Romberg negative.  No pronator drift.   Gait normal.   No results found for: HGBA1C  Lab Results  Component Value Date   CREATININE 0.76 09/22/2014   CREATININE 0.7 09/13/2013   CREATININE 0.94 06/24/2013    Lab Results  Component Value Date   WBC 6.3 06/29/2015   HGB 13.5 06/29/2015   HCT 40.4 06/29/2015   PLT 249.0 06/29/2015   GLUCOSE 98 09/22/2014   CHOL 96 09/22/2014   TRIG 92.0 09/22/2014   HDL 54.30 09/22/2014   LDLCALC 23 09/22/2014   ALT 13 09/22/2014   AST 15 09/22/2014   NA 140 09/22/2014   K 3.6 09/22/2014   CL 105 09/22/2014   CREATININE 0.76 09/22/2014   BUN 11 09/22/2014   CO2 26 09/22/2014   TSH 2.12 09/22/2014    [place] [facility] Uni Right Inc Axilla  Result Date: 07/29/2015 CLINICAL DATA:  Patient describes subareolar right breast pain. EXAM: ULTRASOUND OF THE RIGHT BREAST COMPARISON:  No prior exams. FINDINGS: Targeted ultrasound is performed, evaluating the subareolar and periareolar right breast, showing only normal fibroglandular tissues and fat lobules throughout. No suspicious solid or cystic masses. No dilated ducts. During today's exam, patient also described a palpable "knot" within the lower inner quadrant of the left breast. This area was also evaluated with ultrasound showing only normal fibroglandular tissues and fat lobules. IMPRESSION: No sonographic evidence of malignancy within the subareolar or periareolar right breast. Normal dense fibroglandular tissues within the subareolar and periareolar right breast corresponding to the areas of patient's pain. RECOMMENDATION: Screening mammogram at age 30 unless there are persistent or intervening clinical  concerns. (Code:SM-B-40A) Benign causes of breast pain, and possible remedies, were discussed with the patient. Patient was encouraged to follow-up with referring physician if pain became localized and persistent or if a palpable lump/mass developed. I have discussed the findings and recommendations with the patient. Results were also provided in writing at the conclusion of the visit. If applicable, a reminder letter will be sent to the patient regarding the next appointment. BI-RADS CATEGORY  1: Negative Electronically Signed   By: Bary RichardStan  Maynard M.D.   On: 07/29/2015 10:37   Assessment & Plan:   Problem List Items Addressed This Visit    Insomnia due to psychological stress    Managed with trazodone,  Prn alprazolam      Headache, chronic daily    Advised to refrain fro us eof ibuprofen and start Elavil at 25 mg qhs,  titrating up weekly by 25 mg .  Rx for fioricet given .      Relevant Medications   ibuprofen (ADVIL,MOTRIN) 200 MG tablet   amitriptyline (ELAVIL) 25 MG tablet   butalbital-acetaminophen-caffeine (FIORICET,  ESGIC) 50-325-40 MG tablet   Psoriasis    Risks and benefits of starting [facility] were  discussed with pateint and she was encouraged to engage in a trial        Other Visit Diagnoses   None.    A total of 25 minutes of face to face time was spent with patient more than half of which was spent in counselling about the above mentioned conditions  and coordination of care   I have discontinued Ms. Lybbert's medroxyPROGESTERone, ranitidine, Prenatal Multivit-Min-Fe-FA (PRENATAL VITAMINS PO), aspirin EC, and cyclobenzaprine. I am also having her start on amitriptyline and butalbital-acetaminophen-caffeine. Additionally, I am having her maintain her traZODone, ALPRAZolam, ibuprofen, and promethazine.  Meds ordered this encounter  Medications  . ibuprofen (ADVIL,MOTRIN) 200 MG tablet    Sig: Take 200 mg by mouth every 6 (six) hours as needed.  . promethazine (PHENERGAN)  12.5 MG tablet    Sig: Take 12.5 mg by mouth every 6 (six) hours as needed for nausea or vomiting.  Marland Kitchen amitriptyline (ELAVIL) 25 MG tablet    Sig: Take 1 tablet (25 mg total) by mouth at bedtime. Increase weekly by 25 mg if needed    Dispense:  90 tablet    Refill:  1  . butalbital-acetaminophen-caffeine (FIORICET, ESGIC) 50-325-40 MG tablet    Sig: Take 1-2 tablets by mouth every 6 (six) hours as needed for headache. Maximum 4 daily    Dispense:  60 tablet    Refill:  0    Medications Discontinued During This Encounter  Medication Reason  . aspirin EC 325 MG tablet Change in therapy  . cyclobenzaprine (FLEXERIL) 10 MG tablet Completed Course  . medroxyPROGESTERone (DEPO-PROVERA) 150 MG/ML injection Patient Discharge  . Prenatal Multivit-Min-Fe-FA (PRENATAL VITAMINS PO) Completed Course  . promethazine (PHENERGAN) 12.5 MG suppository Completed Course  . ranitidine (ZANTAC) 150 MG capsule Completed Course    Follow-up: Return in about 3 months (around 12/15/2015).   Sherlene Shams, MD

## 2015-09-18 DIAGNOSIS — R519 Headache, unspecified: Secondary | ICD-10-CM

## 2015-09-18 DIAGNOSIS — L409 Psoriasis, unspecified: Secondary | ICD-10-CM | POA: Insufficient documentation

## 2015-09-18 DIAGNOSIS — R51 Headache: Secondary | ICD-10-CM

## 2015-09-18 HISTORY — DX: Headache, unspecified: R51.9

## 2015-09-18 NOTE — Assessment & Plan Note (Signed)
Risks and benefits of starting [facility] [place] were  discussed with pateint and she was encouraged to engage in a trial

## 2015-09-18 NOTE — Assessment & Plan Note (Signed)
Managed with trazodone,  Prn alprazolam

## 2015-09-18 NOTE — Assessment & Plan Note (Addendum)
Advised to refrain fro us eof ibuprofen and start Elavil at 25 mg qhs,  titrating up weekly by 25 mg .  Rx for fioricet given .

## 2015-11-02 ENCOUNTER — Ambulatory Visit: Payer: Self-pay | Admitting: Physician Assistant

## 2015-11-02 ENCOUNTER — Encounter: Payer: Self-pay | Admitting: Physician Assistant

## 2015-11-02 VITALS — BP 117/81 | HR 91 | Temp 97.9°F

## 2015-11-02 DIAGNOSIS — J028 Acute pharyngitis due to other specified organisms: Secondary | ICD-10-CM

## 2015-11-02 MED ORDER — AZITHROMYCIN 250 MG PO TABS: ORAL_TABLET | ORAL | 0 refills | Status: DC

## 2015-11-02 NOTE — Progress Notes (Signed)
S: C/o runny nose and congestion for 3-4 weeks, sore throat, no cough, + low grade fever this weekend, denies chills, cp/sob, v/d; mucus is yellow and thick occasionally, c/o of facial and dental pain. Son had strep throat 2 weeks ago  Using otc meds:   O: PE: vitals wnl, nad, perrl eomi, normocephalic, tms dull, nasal mucosa red and swollen, throat red irritated, neck supple no lymph, lungs c t a, cv rrr, neuro intact  A:  Acute pharyngitis   P: drink fluids, continue regular meds , use otc meds of choice, return if not improving in 5 days, return earlier if worsening , zpack

## 2015-11-05 ENCOUNTER — Ambulatory Visit (INDEPENDENT_AMBULATORY_CARE_PROVIDER_SITE_OTHER): Payer: 59

## 2015-11-05 ENCOUNTER — Ambulatory Visit (INDEPENDENT_AMBULATORY_CARE_PROVIDER_SITE_OTHER): Payer: 59 | Admitting: Family

## 2015-11-05 ENCOUNTER — Encounter: Payer: Self-pay | Admitting: Family

## 2015-11-05 VITALS — BP 130/80 | HR 107 | Temp 98.0°F | Ht 63.0 in | Wt 141.0 lb

## 2015-11-05 DIAGNOSIS — J40 Bronchitis, not specified as acute or chronic: Secondary | ICD-10-CM | POA: Diagnosis not present

## 2015-11-05 DIAGNOSIS — B379 Candidiasis, unspecified: Secondary | ICD-10-CM | POA: Diagnosis not present

## 2015-11-05 DIAGNOSIS — R05 Cough: Secondary | ICD-10-CM

## 2015-11-05 DIAGNOSIS — T3695XA Adverse effect of unspecified systemic antibiotic, initial encounter: Secondary | ICD-10-CM

## 2015-11-05 DIAGNOSIS — J0120 Acute ethmoidal sinusitis, unspecified: Secondary | ICD-10-CM | POA: Diagnosis not present

## 2015-11-05 LAB — POCT URINE PREGNANCY: PREG TEST UR: NEGATIVE

## 2015-11-05 MED ORDER — DOXYCYCLINE HYCLATE 100 MG PO TABS: 100.0000 mg | ORAL_TABLET | Freq: Two times a day (BID) | ORAL | 0 refills | Status: DC

## 2015-11-05 MED ORDER — FLUCONAZOLE 150 MG PO TABS: 150.0000 mg | ORAL_TABLET | Freq: Once | ORAL | 1 refills | Status: AC

## 2015-11-05 MED ORDER — ALBUTEROL SULFATE HFA 108 (90 BASE) MCG/ACT IN AERS: 2.0000 | INHALATION_SPRAY | Freq: Four times a day (QID) | RESPIRATORY_TRACT | 1 refills | Status: DC | PRN

## 2015-11-05 NOTE — Progress Notes (Signed)
Subjective:    Patient ID: Lori Douglas, female    DOB: 1985-06-14, 30 y.o.   MRN: [id number]  CC: Lori Douglas is a 30 y.o. female who presents today for an acute visit.    HPI: Here for acute visit for sinus congestion, cough over the past month. Waxing and waning. Endorses low grade fever, chills. 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 son who was recently diagnosed with strep throat. Tried sudafed without relief. Worsening since started azithromycin with more congestion, hoarseness.     HISTORY:  Past Medical History:  Diagnosis Date  . Frequent headaches   . Heart murmur   . Migraine   . Preeclampsia   . Psoriasis    Past Surgical History:  Procedure Laterality Date  . CESAREAN SECTION  2015  . CHOLECYSTECTOMY  2015   Family History  Problem Relation Age of Onset  . Arthritis Mother   . Anxiety disorder Mother   . Arthritis Father   . Hyperlipidemia Father   . Hypertension Father   . Depression Father   . Anxiety disorder Father   . Hodgkin's lymphoma Father   . Heart disease Father   . Endometriosis Sister   . Psoriasis Brother   . Diabetes Paternal Aunt   . Arthritis Maternal Grandmother   . Cancer Maternal Grandmother     ovarian and uterine cancer, breast cancer  . Heart disease Maternal Grandmother   . Stroke Maternal Grandmother   . Hypertension Maternal Grandmother   . Cancer Paternal Grandmother     colon cancer  . Alcohol abuse Paternal Grandfather     Allergies: Biaxin [clarithromycin]; Sulfa antibiotics; and Penicillins Current Outpatient Prescriptions on File Prior to Visit  Medication Sig Dispense Refill  . ALPRAZolam (XANAX) 0.25 MG tablet TAKE 1 TABLET BY MOUTH NIGHTLY AT BEDTIME AS NEEDED FOR SLEEP 20 tablet 0  . amitriptyline (ELAVIL) 25 MG tablet Take 1 tablet (25 mg total) by mouth at bedtime. Increase weekly by 25 mg if needed 90 tablet 1  . azithromycin (ZITHROMAX Z-PAK) 250 MG tablet 2  pills today then 1 pill a day for 4 days 6 each 0  . butalbital-acetaminophen-caffeine (FIORICET, ESGIC) 50-325-40 MG tablet Take 1-2 tablets by mouth every 6 (six) hours as needed for headache. Maximum 4 daily 60 tablet 0  . ibuprofen (ADVIL,MOTRIN) 200 MG tablet Take 200 mg by mouth every 6 (six) hours as needed.    . promethazine (PHENERGAN) 12.5 MG tablet Take 12.5 mg by mouth every 6 (six) hours as needed for nausea or vomiting.    . traZODone (DESYREL) 50 MG tablet TAKE 1/2 TO 1 TABLET BY MOUTH AT BEDTIME AS NEEDED FOR SLEEP. 30 tablet 5   No current facility-administered medications on file prior to visit.     Social History  Substance Use Topics  . Smoking status: Never Smoker  . Smokeless tobacco: Never Used  . Alcohol use No    Review of Systems  Constitutional: Negative for chills and fever.  HENT: Positive for congestion and voice change. Negative for sore throat and trouble swallowing.   Respiratory: Positive for cough. Negative for shortness of breath and wheezing.   Cardiovascular: Negative for chest pain and palpitations.  Gastrointestinal: Negative for nausea and vomiting.      Objective:    BP 130/80   Pulse (!) 107   Temp 98 F (36.7 C) (Oral)   Ht 5\' 3"  (1.6 m)  Wt 141 lb (64 kg)   SpO2 99%   BMI 24.98 kg/m    Physical Exam  Constitutional: She appears well-developed and well-nourished.  HENT:  Head: Normocephalic and atraumatic.  Right Ear: Hearing, tympanic membrane, external ear and ear canal normal. No drainage, swelling or tenderness. No foreign bodies. Tympanic membrane is not erythematous and not bulging. No middle ear effusion. No decreased hearing is noted.  Left Ear: Hearing, tympanic membrane, external ear and ear canal normal. No drainage, swelling or tenderness. No foreign bodies. Tympanic membrane is not erythematous and not bulging.  No middle ear effusion. No decreased hearing is noted.  Nose: Rhinorrhea present. Right sinus exhibits no  maxillary sinus tenderness and no frontal sinus tenderness. Left sinus exhibits no maxillary sinus tenderness and no frontal sinus tenderness.  Mouth/Throat: Uvula is midline, oropharynx is clear and moist and mucous membranes are normal. No oropharyngeal exudate, posterior oropharyngeal edema, posterior oropharyngeal erythema or tonsillar abscesses.  Eyes: Conjunctivae are normal.  Cardiovascular: Regular rhythm, normal heart sounds and normal pulses.   Pulmonary/Chest: Effort normal and breath sounds normal. She has no wheezes. She has no rhonchi. She has no rales.  Lymphadenopathy:       Head (right side): No submental, no submandibular, no tonsillar, no preauricular, no posterior auricular and no occipital adenopathy present.       Head (left side): No submental, no submandibular, no tonsillar, no preauricular, no posterior auricular and no occipital adenopathy present.    She has no cervical adenopathy.  Neurological: She is alert.  Skin: Skin is warm and dry.  Psychiatric: She has a normal mood and affect. Her speech is normal and behavior is normal. Thought content normal.  Vitals reviewed.      Assessment & Plan:   1. Acute non-recurrent ethmoidal sinusitis/ bronchitis Afebrile. No adventitious lung sounds. Based on duration of symptoms and failure to improve on azithromycin, patient and I jointly agreed to stop azithromycin and to start doxycycline. Pending CXR. Albuterol for PRN wheezing.   - doxycycline (VIBRA-TABS) 100 MG tablet; Take 1 tablet (100 mg total) by mouth 2 (two) times daily.  Dispense: 10 tablet; Refill: 0 - POCT urine pregnancy - albuterol (PROVENTIL HFA) 108 (90 Base) MCG/ACT inhaler; Inhale 2 puffs into the lungs every 6 (six) hours as needed for wheezing or shortness of breath.  Dispense: 1 Inhaler; Refill: 1 - DG Chest 2 View  3. Antibiotic-induced yeast infection Prescription given in case patient develops yeast infection. Encouraged probiotics. -  fluconazole (DIFLUCAN) 150 MG tablet; Take 1 tablet (150 mg total) by mouth once. Take one tablet PO once. If continue to have symptoms, may take one tablet PO 3 days later.  Dispense: 2 tablet; Refill: 1    I am having [name] start on fluconazole, doxycycline, and albuterol. I am also having her maintain her traZODone, ALPRAZolam, ibuprofen, promethazine, amitriptyline, butalbital-acetaminophen-caffeine, and azithromycin.   Meds ordered this encounter  Medications  . fluconazole (DIFLUCAN) 150 MG tablet    Sig: Take 1 tablet (150 mg total) by mouth once. Take one tablet PO once. If continue to have symptoms, may take one tablet PO 3 days later.    Dispense:  2 tablet    Refill:  1    Order Specific Question:   Supervising Provider    Answer:   TULLO, TERESA L [[id number]]  . doxycycline (VIBRA-TABS) 100 MG tablet    Sig: Take 1 tablet (100 mg total) by mouth 2 (two) times daily.  Dispense:  10 tablet    Refill:  0    Order Specific Question:   Supervising Provider    Answer:   Duncan DullULLO, TERESA L [[id number]]  . albuterol (PROVENTIL HFA) 108 (90 Base) MCG/ACT inhaler    Sig: Inhale 2 puffs into the lungs every 6 (six) hours as needed for wheezing or shortness of breath.    Dispense:  1 Inhaler    Refill:  1    Order Specific Question:   Supervising Provider    Answer:   Sherlene ShamsULLO, TERESA L [[id number]]    Return precautions given.   Risks, benefits, and alternatives of the medications and treatment plan prescribed today were discussed, and patient expressed understanding.   Education regarding symptom management and diagnosis given to patient on AVS.  Continue to follow with TULLO, Mar DaringERESA L, MD for routine health maintenance.   Lori MaineBrandi N Douglas and I agreed with plan.   Rennie PlowmanMargaret Starla Deller, FNP

## 2015-11-05 NOTE — Patient Instructions (Signed)

## 2015-11-05 NOTE — Telephone Encounter (Signed)
Called patient states since  chest xray showed  fluid in abdomen.    Is it necessary to take antibiotic since she was negative for pneumonia?

## 2015-11-05 NOTE — Progress Notes (Signed)
Pre visit review using our clinic review tool, if applicable. No additional management support is needed unless otherwise documented below in the visit note. 

## 2015-12-15 ENCOUNTER — Ambulatory Visit: Payer: Self-pay | Admitting: Internal Medicine

## 2015-12-18 ENCOUNTER — Encounter: Payer: Self-pay | Admitting: Internal Medicine

## 2015-12-18 ENCOUNTER — Ambulatory Visit (INDEPENDENT_AMBULATORY_CARE_PROVIDER_SITE_OTHER): Payer: 59 | Admitting: Internal Medicine

## 2015-12-18 DIAGNOSIS — R51 Headache: Secondary | ICD-10-CM

## 2015-12-18 DIAGNOSIS — R519 Headache, unspecified: Secondary | ICD-10-CM

## 2015-12-18 DIAGNOSIS — R0981 Nasal congestion: Secondary | ICD-10-CM | POA: Diagnosis not present

## 2015-12-18 MED ORDER — AMITRIPTYLINE HCL 50 MG PO TABS: 50.0000 mg | ORAL_TABLET | Freq: Every day | ORAL | 1 refills | Status: DC

## 2015-12-18 MED ORDER — PREDNISONE 10 MG PO TABS: ORAL_TABLET | ORAL | 0 refills | Status: DC

## 2015-12-18 NOTE — Progress Notes (Signed)
Pre-visit discussion using our clinic review tool. No additional management support is needed unless otherwise documented below in the visit note.  

## 2015-12-18 NOTE — Progress Notes (Signed)
Subjective:  Patient ID: Lori Douglas, female    DOB: 1985-09-08  Age: 30 y.o. MRN: [id number]  CC: Diagnoses of Nasal sinus congestion and Headache, chronic daily were pertinent to this visit.  HPI Lori Douglas presents for follow up on headaches.  Started on elavil at last visit 25 mg with option to increase gradually .  She has increased it to 50 mg but has excessive morning sedation at higher dose trials. Her headaches have persisted but she feels they are aggravated by sinus congestion which has been persistent for 6 weeks.   Treated for sinusitis/bronchitis  in November by Claris CheMargaret.  Chest x ray was done,  Heart was noted to be slightly enlarged,  No pulmonary hypertension,  And stomach was distended and filled with fluid (no symptoms, may have been a  S/e of amitripytline) Sinus congestion persistent, no improvement with 2 rounds of abx.  No sore throat,  No fevers,  Clear nasal drainage ,  Using claritin.    Her young Son has been sick recurrently for the past month with strep pharyngitis.  Marland Kitchen.    wt loss of 4 lbs ince Nov visit   Surgical history : c section and chole  2015  Outpatient Medications Prior to Visit  Medication Sig Dispense Refill  . albuterol (PROVENTIL HFA) 108 (90 Base) MCG/ACT inhaler Inhale 2 puffs into the lungs every 6 (six) hours as needed for wheezing or shortness of breath. (Patient not taking: Reported on 12/18/2015) 1 Inhaler 1  . ALPRAZolam (XANAX) 0.25 MG tablet TAKE 1 TABLET BY MOUTH NIGHTLY AT BEDTIME AS NEEDED FOR SLEEP 20 tablet 0  . butalbital-acetaminophen-caffeine (FIORICET, ESGIC) 50-325-40 MG tablet Take 1-2 tablets by mouth every 6 (six) hours as needed for headache. Maximum 4 daily 60 tablet 0  . ibuprofen (ADVIL,MOTRIN) 200 MG tablet Take 200 mg by mouth every 6 (six) hours as needed.    . traZODone (DESYREL) 50 MG tablet TAKE 1/2 TO 1 TABLET BY MOUTH AT BEDTIME AS NEEDED FOR SLEEP. 30 tablet 5  . amitriptyline (ELAVIL) 25 MG tablet  Take 1 tablet (25 mg total) by mouth at bedtime. Increase weekly by 25 mg if needed 90 tablet 1  . azithromycin (ZITHROMAX Z-PAK) 250 MG tablet 2 pills today then 1 pill a day for 4 days 6 each 0  . doxycycline (VIBRA-TABS) 100 MG tablet Take 1 tablet (100 mg total) by mouth 2 (two) times daily. 10 tablet 0  . promethazine (PHENERGAN) 12.5 MG tablet Take 12.5 mg by mouth every 6 (six) hours as needed for nausea or vomiting.     No facility-administered medications prior to visit.     Review of Systems;  Patient denies , fevers, malaise, unintentional weight loss, skin rash, eye pain,sore throat, dysphagia,  hemoptysis , cough, dyspnea, wheezing, chest pain, palpitations, orthopnea, edema, abdominal pain, nausea, melena, diarrhea, constipation, flank pain, dysuria, hematuria, urinary  Frequency, nocturia, numbness, tingling, seizures,  Focal weakness, Loss of consciousness,  Tremor, insomnia, depression, anxiety, and suicidal ideation.      Objective:  BP 132/72   Pulse (!) 103   Temp 97.9 F (36.6 C) (Oral)   Resp 12   Ht 5\' 3"  (1.6 m)   Wt 137 lb (62.1 kg)   LMP 12/07/2015   SpO2 99%   BMI 24.27 kg/m    BP Readings from Last 3 Encounters:  12/18/15 132/72  11/05/15 130/80  11/02/15 117/81    Wt Readings from Last 3 Encounters:  12/18/15 137 lb (62.1 kg)  11/05/15 141 lb (64 kg)  09/15/15 136 lb (61.7 kg)    General appearance: alert, cooperative and appears stated age Ears: normal TM's and external ear canals both ears Throat: lips, mucosa, and tongue normal; teeth and gums normal Neck: no adenopathy, no carotid bruit, supple, symmetrical, trachea midline and thyroid not enlarged, symmetric, no tenderness/mass/nodules Back: symmetric, no curvature. ROM normal. No CVA tenderness. Lungs: clear to auscultation bilaterally Heart: regular rate and rhythm, S1, S2 normal, no murmur, click, rub or gallop Abdomen: soft, non-tender; bowel sounds normal; no masses,  no  organomegaly Pulses: 2+ and symmetric Skin: Skin color, texture, turgor normal. No rashes or lesions Lymph nodes: Cervical, supraclavicular, and axillary nodes normal.  No results found for: HGBA1C  Lab Results  Component Value Date   CREATININE 0.76 09/22/2014   CREATININE 0.7 09/13/2013   CREATININE 0.94 06/24/2013    Lab Results  Component Value Date   WBC 6.3 06/29/2015   HGB 13.5 06/29/2015   HCT 40.4 06/29/2015   PLT 249.0 06/29/2015   GLUCOSE 98 09/22/2014   CHOL 96 09/22/2014   TRIG 92.0 09/22/2014   HDL 54.30 09/22/2014   LDLCALC 23 09/22/2014   ALT 13 09/22/2014   AST 15 09/22/2014   NA 140 09/22/2014   K 3.6 09/22/2014   CL 105 09/22/2014   CREATININE 0.76 09/22/2014   BUN 11 09/22/2014   CO2 26 09/22/2014   TSH 2.12 09/22/2014    [place] [facility] Uni Right Inc Axilla  Result Date: 07/29/2015 CLINICAL DATA:  Patient describes subareolar right breast pain. EXAM: ULTRASOUND OF THE RIGHT BREAST COMPARISON:  No prior exams. FINDINGS: Targeted ultrasound is performed, evaluating the subareolar and periareolar right breast, showing only normal fibroglandular tissues and fat lobules throughout. No suspicious solid or cystic masses. No dilated ducts. During today's exam, patient also described a palpable "knot" within the lower inner quadrant of the left breast. This area was also evaluated with ultrasound showing only normal fibroglandular tissues and fat lobules. IMPRESSION: No sonographic evidence of malignancy within the subareolar or periareolar right breast. Normal dense fibroglandular tissues within the subareolar and periareolar right breast corresponding to the areas of patient's pain. RECOMMENDATION: Screening mammogram at age 30 unless there are persistent or intervening clinical concerns. (Code:SM-B-40A) Benign causes of breast pain, and possible remedies, were discussed with the patient. Patient was encouraged to follow-up with referring physician if pain became  localized and persistent or if a palpable lump/mass developed. I have discussed the findings and recommendations with the patient. Results were also provided in writing at the conclusion of the visit. If applicable, a reminder letter will be sent to the patient regarding the next appointment. BI-RADS CATEGORY  1: Negative Electronically Signed   By: Bary RichardStan  Maynard M.D.   On: 07/29/2015 10:37   Assessment & Plan:   Problem List Items Addressed This Visit    Headache, chronic daily    Improving with use of bedtime elavil . Refill at 50mg  daily       Relevant Medications   amitriptyline (ELAVIL) 50 MG tablet   Nasal sinus congestion    Without purulent discharge, fevers or facial pain.  Saline irrigation and prednisonee taper recommended,  Ct of sinuses if no improvement         I have discontinued Ms. Ries's promethazine, azithromycin, and doxycycline. I have also changed her amitriptyline. Additionally, I am having her start on predniSONE. Lastly, I am having her maintain  her traZODone, ALPRAZolam, ibuprofen, butalbital-acetaminophen-caffeine, and albuterol.  Meds ordered this encounter  Medications  . amitriptyline (ELAVIL) 50 MG tablet    Sig: Take 1 tablet (50 mg total) by mouth at bedtime.    Dispense:  90 tablet    Refill:  1  . predniSONE (DELTASONE) 10 MG tablet    Sig: 6 tablets on Day 1 , then reduce by 1 tablet daily until gone    Dispense:  21 tablet    Refill:  0    Medications Discontinued During This Encounter  Medication Reason  . azithromycin (ZITHROMAX Z-PAK) 250 MG tablet Change in therapy  . doxycycline (VIBRA-TABS) 100 MG tablet Cost of medication  . promethazine (PHENERGAN) 12.5 MG tablet Cost of medication  . amitriptyline (ELAVIL) 25 MG tablet Reorder  A total of 25 minutes of face to face time was spent with patient more than half of which was spent in counselling about the above mentioned conditions  and coordination of care   Follow-up: Return in  about 4 weeks (around 01/15/2016).   Sherlene Shams, MD

## 2015-12-18 NOTE — Patient Instructions (Signed)
I'm glad the headaches are improving with use of Elavil  I have refilled it at the 50 mg dose  I recommend a trial of daily saline irrigaition with [facility]'s sinus rinse and a prednisone taper,  If you don't feel an improvement in the sinus pressure after this,  We'll order a CT of your sinuses vs have you see an ENT specilaist

## 2015-12-20 DIAGNOSIS — R0981 Nasal congestion: Secondary | ICD-10-CM | POA: Insufficient documentation

## 2015-12-20 NOTE — Assessment & Plan Note (Signed)
Improving with use of bedtime elavil . Refill at 50mg  daily

## 2015-12-20 NOTE — Assessment & Plan Note (Signed)
Without purulent discharge, fevers or facial pain.  Saline irrigation and prednisonee taper recommended,  Ct of sinuses if no improvement

## 2016-01-26 ENCOUNTER — Ambulatory Visit (INDEPENDENT_AMBULATORY_CARE_PROVIDER_SITE_OTHER): Payer: 59 | Admitting: Internal Medicine

## 2016-01-26 ENCOUNTER — Encounter: Payer: Self-pay | Admitting: Internal Medicine

## 2016-01-26 DIAGNOSIS — F5102 Adjustment insomnia: Secondary | ICD-10-CM

## 2016-01-26 DIAGNOSIS — R51 Headache: Secondary | ICD-10-CM | POA: Diagnosis not present

## 2016-01-26 DIAGNOSIS — R519 Headache, unspecified: Secondary | ICD-10-CM

## 2016-01-26 DIAGNOSIS — Z20828 Contact with and (suspected) exposure to other viral communicable diseases: Secondary | ICD-10-CM | POA: Diagnosis not present

## 2016-01-26 MED ORDER — CYCLOBENZAPRINE HCL 5 MG PO TABS: 5.0000 mg | ORAL_TABLET | Freq: Three times a day (TID) | ORAL | 1 refills | Status: DC | PRN

## 2016-01-26 MED ORDER — OSELTAMIVIR PHOSPHATE 75 MG PO CAPS: 75.0000 mg | ORAL_CAPSULE | Freq: Every day | ORAL | 0 refills | Status: DC

## 2016-01-26 MED ORDER — METOPROLOL SUCCINATE ER 25 MG PO TB24: 25.0000 mg | ORAL_TABLET | Freq: Every day | ORAL | 1 refills | Status: DC

## 2016-01-26 NOTE — Progress Notes (Signed)
Pre-visit discussion using our clinic review tool. No additional management support is needed unless otherwise documented below in the visit note.  

## 2016-01-26 NOTE — Patient Instructions (Signed)
Adding Toprol XL 25 mg at bedtime to help reduce the headache frequency  You and Weston Brassick BOTH need to be on Tamiflu once daily for prevention  Adding flexeril to use for next tension headache

## 2016-01-26 NOTE — Progress Notes (Signed)
Subjective:  Patient ID: Lori Douglas, female    DOB: 05-14-1985  Age: 31 y.o. MRN: [id number]  CC: Diagnoses of Headache, chronic daily, Insomnia due to psychological stress, and Exposure to influenza were pertinent to this visit.  HPI FAMA Lori Douglas presents for follow up on migraine headaches.  She was last seen in mid December for persistent sinus congestion , which was treated with prednisone taper and irrigation.  Sinuses have improved with daily rinses  Which resolve  congestion. Clearing up ,  No more green mucus.    She had a recent episode last week that started as a mild headache, treated with fioricet prn.  Several days later the headache became so severe she considered  going to the ER. HA was accompanied by nausea, no vomiting.  Had mild photosensitivity, not severe. Took benadryl and elavil and went to bed.   Thinks it was stress related.  Some marital issues,  Working through,  Both kids being sick,  Working in the OR as an Charity fundraiser,  really busy schedule.  averaging about 6 or more hours per night of sleep ,. waking up refreshed,trying to exercise, planning on  getting a massage.   Influenza exposure: son  Diagnosed. denies body aches, fevers,  Nausea.    Outpatient Medications Prior to Visit  Medication Sig Dispense Refill  . ALPRAZolam (XANAX) 0.25 MG tablet TAKE 1 TABLET BY MOUTH NIGHTLY AT BEDTIME AS NEEDED FOR SLEEP 20 tablet 0  . amitriptyline (ELAVIL) 50 MG tablet Take 1 tablet (50 mg total) by mouth at bedtime. 90 tablet 1  . butalbital-acetaminophen-caffeine (FIORICET, ESGIC) 50-325-40 MG tablet Take 1-2 tablets by mouth every 6 (six) hours as needed for headache. Maximum 4 daily 60 tablet 0  . traZODone (DESYREL) 50 MG tablet TAKE 1/2 TO 1 TABLET BY MOUTH AT BEDTIME AS NEEDED FOR SLEEP. 30 tablet 5  . albuterol (PROVENTIL HFA) 108 (90 Base) MCG/ACT inhaler Inhale 2 puffs into the lungs every 6 (six) hours as needed for wheezing or shortness of breath. (Patient not  taking: Reported on 12/18/2015) 1 Inhaler 1  . ibuprofen (ADVIL,MOTRIN) 200 MG tablet Take 200 mg by mouth every 6 (six) hours as needed.    . predniSONE (DELTASONE) 10 MG tablet 6 tablets on Day 1 , then reduce by 1 tablet daily until gone 21 tablet 0   No facility-administered medications prior to visit.     Review of Systems;  Patient denies headache, fevers, malaise, unintentional weight loss, skin rash, eye pain, sinus congestion and sinus pain, sore throat, dysphagia,  hemoptysis , cough, dyspnea, wheezing, chest pain, palpitations, orthopnea, edema, abdominal pain, nausea, melena, diarrhea, constipation, flank pain, dysuria, hematuria, urinary  Frequency, nocturia, numbness, tingling, seizures,  Focal weakness, Loss of consciousness,  Tremor, insomnia, depression, anxiety, and suicidal ideation.      Objective:  BP 100/80   Pulse (!) 101   Temp 97.9 F (36.6 C) (Oral)   Resp 16   Ht 5\' 3"  (1.6 m)   Wt 135 lb 2 oz (61.3 kg)   LMP 01/17/2016   SpO2 95%   BMI 23.94 kg/m   BP Readings from Last 3 Encounters:  01/26/16 100/80  12/18/15 132/72  11/05/15 130/80    Wt Readings from Last 3 Encounters:  01/26/16 135 lb 2 oz (61.3 kg)  12/18/15 137 lb (62.1 kg)  11/05/15 141 lb (64 kg)    General appearance: alert, cooperative and appears stated age Ears: normal TM's and external  ear canals both ears Throat: lips, mucosa, and tongue normal; teeth and gums normal Neck: no adenopathy, no carotid bruit, supple, symmetrical, trachea midline and thyroid not enlarged, symmetric, no tenderness/mass/nodules Back: symmetric, no curvature. ROM normal. No CVA tenderness. Lungs: clear to auscultation bilaterally Heart: regular rate and rhythm, S1, S2 normal, no murmur, click, rub or gallop Abdomen: soft, non-tender; bowel sounds normal; no masses,  no organomegaly Pulses: 2+ and symmetric Skin: Skin color, texture, turgor normal. No rashes or lesions Lymph nodes: Cervical,  supraclavicular, and axillary nodes normal.  No results found for: HGBA1C  Lab Results  Component Value Date   CREATININE 0.76 09/22/2014   CREATININE 0.7 09/13/2013   CREATININE 0.94 06/24/2013    Lab Results  Component Value Date   WBC 6.3 06/29/2015   HGB 13.5 06/29/2015   HCT 40.4 06/29/2015   PLT 249.0 06/29/2015   GLUCOSE 98 09/22/2014   CHOL 96 09/22/2014   TRIG 92.0 09/22/2014   HDL 54.30 09/22/2014   LDLCALC 23 09/22/2014   ALT 13 09/22/2014   AST 15 09/22/2014   NA 140 09/22/2014   K 3.6 09/22/2014   CL 105 09/22/2014   CREATININE 0.76 09/22/2014   BUN 11 09/22/2014   CO2 26 09/22/2014   TSH 2.12 09/22/2014    US Breast Ltd Uni Right Inc Axilla  Result Date: 07/29/2015 CLINICAL DATA:  Patient describes subareolar right breast pain. EXAM: ULTRASOUND OF THE RIGHT BREAST COMPARISON:  No prior exams. FINDINGS: Targeted ultrasound is performed, evaluating the subareolar and periareolar right breast, showing only normal fibroglandular tissues and fat lobules throughout. No suspicious solid or cystic masses. No dilated ducts. During today's exam, patient also described a palpable "knot" within the lower inner quadrant of the left breast. This area was also evaluated with ultrasound showing only normal fibroglandular tissues and fat lobules. IMPRESSION: No sonographic evidence of malignancy within the subareolar or periareolar right breast. Normal dense fibroglandular tissues within the subareolar and periareolar right breast corresponding to the areas of patient's pain. RECOMMENDATION: Screening mammogram at age 67 unless there are persistent or intervening clinical concerns. (Code:[id number]-B-40A) Benign causes of breast pain, and possible remedies, were discussed with the patient. Patient was encouraged to follow-up with referring physician if pain became localized and persistent or if a palpable lump/mass developed. I have discussed the findings and recommendations with the patient.  Results were also provided in writing at the conclusion of the visit. If applicable, a reminder letter will be sent to the patient regarding the next appointment. BI-RADS CATEGORY  1: Negative Electronically Signed   By: Bary Richard M.D.   On: 07/29/2015 10:37   Assessment & Plan:   Problem List Items Addressed This Visit    Exposure to influenza    Prophylaxis recommended for patient and husband       Headache, chronic daily    Improved with elavil,  Discussed ways to manage stress,  rx  flexeril prn.       Relevant Medications   cyclobenzaprine (FLEXERIL) 5 MG tablet   metoprolol succinate (TOPROL-XL) 25 MG 24 hr tablet   Insomnia due to psychological stress    Improved with trazodone         I have discontinued Ms. Galbraith's ibuprofen and predniSONE. I am also having her start on cyclobenzaprine, oseltamivir, and metoprolol succinate. Additionally, I am having her maintain her traZODone, ALPRAZolam, butalbital-acetaminophen-caffeine, albuterol, and amitriptyline.  Meds ordered this encounter  Medications  . cyclobenzaprine (FLEXERIL) 5 MG tablet  Sig: Take 1 tablet (5 mg total) by mouth 3 (three) times daily as needed for muscle spasms.    Dispense:  30 tablet    Refill:  1  . oseltamivir (TAMIFLU) 75 MG capsule    Sig: Take 1 capsule (75 mg total) by mouth daily.    Dispense:  10 capsule    Refill:  0  . metoprolol succinate (TOPROL-XL) 25 MG 24 hr tablet    Sig: Take 1 tablet (25 mg total) by mouth daily.    Dispense:  30 tablet    Refill:  1    Medications Discontinued During This Encounter  Medication Reason  . ibuprofen (ADVIL,MOTRIN) 200 MG tablet No longer needed (for PRN medications)  . predniSONE (DELTASONE) 10 MG tablet Completed Course   A total of 25 minutes of face to face time was spent with patient more than half of which was spent in counselling about the above mentioned conditions  and coordination of care  Follow-up: No Follow-up on  file.   Sherlene ShamsULLO, Fannye Myer L, MD

## 2016-01-28 DIAGNOSIS — Z20828 Contact with and (suspected) exposure to other viral communicable diseases: Secondary | ICD-10-CM | POA: Insufficient documentation

## 2016-01-28 NOTE — Assessment & Plan Note (Signed)
Improved with trazodone  

## 2016-01-28 NOTE — Assessment & Plan Note (Signed)
Improved with elavil,  Discussed ways to manage stress,  rx  flexeril prn.

## 2016-01-28 NOTE — Assessment & Plan Note (Signed)
Prophylaxis recommended for patient and husband

## 2016-02-10 ENCOUNTER — Other Ambulatory Visit: Payer: Self-pay | Admitting: Internal Medicine

## 2016-02-10 NOTE — Telephone Encounter (Signed)
refilled 

## 2016-02-10 NOTE — Telephone Encounter (Signed)
Pt last refill on Xanax was on 08/06/15. Pt last OV was on 01/26/16. OK to refill?

## 2016-03-28 ENCOUNTER — Encounter: Payer: Self-pay | Admitting: Internal Medicine

## 2016-03-30 ENCOUNTER — Other Ambulatory Visit: Payer: Self-pay | Admitting: Internal Medicine

## 2016-03-30 MED ORDER — FLUCONAZOLE 150 MG PO TABS: 150.0000 mg | ORAL_TABLET | Freq: Every day | ORAL | 1 refills | Status: DC

## 2016-04-19 ENCOUNTER — Other Ambulatory Visit: Payer: Self-pay | Admitting: Internal Medicine

## 2016-04-19 NOTE — Telephone Encounter (Signed)
Refilled: 01/26/2016 Last OV: 01/26/2016 Next OV: not scheduled

## 2016-04-29 ENCOUNTER — Telehealth: Payer: 59 | Admitting: Nurse Practitioner

## 2016-04-29 ENCOUNTER — Encounter: Payer: Self-pay | Admitting: Internal Medicine

## 2016-04-29 DIAGNOSIS — J029 Acute pharyngitis, unspecified: Secondary | ICD-10-CM | POA: Diagnosis not present

## 2016-04-29 MED ORDER — CEFDINIR 300 MG PO CAPS: 300.0000 mg | ORAL_CAPSULE | Freq: Two times a day (BID) | ORAL | 0 refills | Status: DC

## 2016-04-29 NOTE — Progress Notes (Signed)
We are sorry that you are not feeling well.  Here is how we plan to help!  Based on what you have shared with me it looks like you have strep pharyngitis.  Strep pharyngitis is inflammation and infection in the back of the throat.  This is an infection caused by bacteria and is treated with antibiotics. I have prescribed omnicef one tablet twice daily with food, for 7 days. You may use an oral throat lozenges as needed. Strep infections are not as easily transmitted as other respiratory infection, however we still recommend that you avoid close contact with loved ones, especially the very young and elderly.  Remember to wash your hands thoroughly throughout the day as this is the number one way to prevent the spread of infection!  Home Care:  Only take medications as instructed by your medical team.  Complete the entire course of an antibiotic.  Do not take these medications with alcohol.  A steam or ultrasonic humidifier can help congestion.  You can place a towel over your head and breathe in the steam from hot water coming from a faucet.  Avoid close contacts especially the very young and the elderly.  Cover your mouth when you cough or sneeze.  Always remember to wash your hands.  Get Help Right Away If:  You develop worsening fever or sinus pain.  You develop a severe head ache or visual changes.  Your symptoms persist after you have completed your treatment plan.  Make sure you  Understand these instructions.  Will watch your condition.  Will get help right away if you are not doing well or get worse.  Your e-visit answers were reviewed by a board certified advanced clinical practitioner to complete your personal care plan.  Depending on the condition, your plan could have included both over the counter or prescription medications.  If there is a problem please reply  once you have received a response from your provider.  Your safety is important to [place].  If you have drug  allergies check your prescription carefully.    You can use MyChart to ask questions about today's visit, request a non-urgent call back, or ask for a work or school excuse for 24 hours related to this e-Visit. If it has been greater than 24 hours you will need to follow up with your provider, or enter a new e-Visit to address those concerns.  You will get an e-mail in the next two days asking about your experience.  I hope that your e-visit has been valuable and will speed your recovery. Thank you for using e-visits.

## 2016-06-16 ENCOUNTER — Other Ambulatory Visit: Payer: Self-pay | Admitting: Internal Medicine

## 2016-08-22 ENCOUNTER — Other Ambulatory Visit: Payer: Self-pay | Admitting: Internal Medicine

## 2016-09-28 NOTE — Telephone Encounter (Signed)
Error

## 2016-11-16 ENCOUNTER — Telehealth: Payer: 59 | Admitting: Nurse Practitioner

## 2016-11-16 DIAGNOSIS — J40 Bronchitis, not specified as acute or chronic: Secondary | ICD-10-CM | POA: Diagnosis not present

## 2016-11-16 DIAGNOSIS — R059 Cough, unspecified: Secondary | ICD-10-CM

## 2016-11-16 DIAGNOSIS — R05 Cough: Secondary | ICD-10-CM | POA: Diagnosis not present

## 2016-11-16 MED ORDER — AZITHROMYCIN 250 MG PO TABS: ORAL_TABLET | ORAL | 0 refills | Status: DC

## 2016-11-16 MED ORDER — BENZONATATE 100 MG PO CAPS: 100.0000 mg | ORAL_CAPSULE | Freq: Three times a day (TID) | ORAL | 0 refills | Status: DC | PRN

## 2016-11-16 MED ORDER — ALBUTEROL SULFATE HFA 108 (90 BASE) MCG/ACT IN AERS: 2.0000 | INHALATION_SPRAY | Freq: Four times a day (QID) | RESPIRATORY_TRACT | 1 refills | Status: DC | PRN

## 2016-11-16 NOTE — Progress Notes (Signed)
We are sorry that you are not feeling well.  Here is how we plan to help!  Based on your presentation I believe you most likely have A cough due to bacteria.  When patients have a fever and a productive cough with a change in color or increased sputum production, we are concerned about bacterial bronchitis.  If left untreated it can progress to pneumonia.  If your symptoms do not improve with your treatment plan it is important that you contact your provider.   I have prescribed Azithromyin 250 mg: two tablets now and then one tablet daily for 4 additonal days    In addition you may use A prescription cough medication called Tessalon Perles 100mg . You may take 1-2 capsules every 8 hours as needed for your cough.  *spoke with patient and she is not allergic to z pak. She is allergic to active ingredient in biaxin.  From your responses in the eVisit questionnaire you describe inflammation in the upper respiratory tract which is causing a significant cough.  This is commonly called Bronchitis and has four common causes:    Allergies  Viral Infections  Acid Reflux  Bacterial Infection Allergies, viruses and acid reflux are treated by controlling symptoms or eliminating the cause. An example might be a cough caused by taking certain blood pressure medications. You stop the cough by changing the medication. Another example might be a cough caused by acid reflux. Controlling the reflux helps control the cough.  USE OF BRONCHODILATOR ("RESCUE") INHALERS: There is a risk from using your bronchodilator too frequently.  The risk is that over-reliance on a medication which only relaxes the muscles surrounding the breathing tubes can reduce the effectiveness of medications prescribed to reduce swelling and congestion of the tubes themselves.  Although you feel brief relief from the bronchodilator inhaler, your asthma may actually be worsening with the tubes becoming more swollen and filled with mucus.  This  can delay other crucial treatments, such as oral steroid medications. If you need to use a bronchodilator inhaler daily, several times per day, you should discuss this with your provider.  There are probably better treatments that could be used to keep your asthma under control.     HOME CARE . Only take medications as instructed by your medical team. . Complete the entire course of an antibiotic. . Drink plenty of fluids and get plenty of rest. . Avoid close contacts especially the very young and the elderly . Cover your mouth if you cough or cough into your sleeve. . Always remember to wash your hands . A steam or ultrasonic humidifier can help congestion.   GET HELP RIGHT AWAY IF: . You develop worsening fever. . You become short of breath . You cough up blood. . Your symptoms persist after you have completed your treatment plan MAKE SURE YOU   Understand these instructions.  Will watch your condition.  Will get help right away if you are not doing well or get worse.  Your e-visit answers were reviewed by a board certified advanced clinical practitioner to complete your personal care plan.  Depending on the condition, your plan could have included both over the counter or prescription medications. If there is a problem please reply  once you have received a response from your provider. Your safety is important to us.  If you have drug allergies check your prescription carefully.    You can use MyChart to ask questions about today's visit, request a non-urgent call back,  or ask for a work or school excuse for 24 hours related to this e-Visit. If it has been greater than 24 hours you will need to follow up with your provider, or enter a new e-Visit to address those concerns. You will get an e-mail in the next two days asking about your experience.  I hope that your e-visit has been valuable and will speed your recovery. Thank you for using e-visits.

## 2016-11-22 ENCOUNTER — Other Ambulatory Visit: Payer: Self-pay | Admitting: Internal Medicine

## 2016-11-22 NOTE — Telephone Encounter (Signed)
Please notify patient that the prescription  was Refilled for 30 days only because it has been 10months since last visit. Lori Douglas.  OFFICE VISIT NEEDED prior to any more refills

## 2016-11-22 NOTE — Telephone Encounter (Signed)
Printed, signed and faxed.  

## 2016-11-22 NOTE — Telephone Encounter (Signed)
Refilled: 02/10/2016 Last OV: 01/26/2016 Next OV: not scheduled

## 2016-11-24 ENCOUNTER — Encounter: Payer: Self-pay | Admitting: Internal Medicine

## 2016-11-24 ENCOUNTER — Telehealth: Payer: 59 | Admitting: Physician Assistant

## 2016-11-24 DIAGNOSIS — J4541 Moderate persistent asthma with (acute) exacerbation: Secondary | ICD-10-CM

## 2016-11-24 DIAGNOSIS — J209 Acute bronchitis, unspecified: Secondary | ICD-10-CM

## 2016-11-24 NOTE — Progress Notes (Signed)
Based on what you shared with me it looks like you have a condition that should be evaluated in a face to face office visit. Giving you have continued symptoms despite antibiotics and giving continued wheezing, you need assessment in person. NOTE: Even if you have entered your credit card information for this eVisit, you will not be charged.   If you are having a true medical emergency please call 911.  If you need an urgent face to face visit, [place] has four urgent care centers for your convenience.  If you need care fast and have a high deductible or no insurance consider:   [email or web address]  [phone]  [address], [address] [place], [place] [address] 8 am to 8 pm Monday-Friday 10 am to 4 pm Saturday-Sunday   The following sites will take your  insurance:    . [facility]  [phone] Get Driving Directions Find a Provider at this Location  [address] [place], [place] [address] . 10 am to 8 pm Monday-Friday . 12 pm to 8 pm Saturday-Sunday   . [facility]  [phone] Get Driving Directions Find a Provider at this Location  [address], [address] [place], [place] [address] . 8 am to 8 pm Monday-Friday . 9 am to 6 pm Saturday . 11 am to 6 pm Sunday   . [facility] [place]  [phone] Get Driving Directions  [address] [place], [place] [address] . 8 am to 8 pm Monday-Friday . 8 am to 4 pm Saturday-Sunday   Your e-visit answers were reviewed by a board certified advanced clinical practitioner to complete your personal care plan.  Thank you for using e-Visits.

## 2016-12-08 DIAGNOSIS — H5203 Hypermetropia, bilateral: Secondary | ICD-10-CM | POA: Diagnosis not present

## 2017-01-04 ENCOUNTER — Encounter: Payer: Self-pay | Admitting: Internal Medicine

## 2017-02-01 ENCOUNTER — Other Ambulatory Visit: Payer: Self-pay | Admitting: Internal Medicine

## 2017-03-01 ENCOUNTER — Telehealth: Payer: No Typology Code available for payment source | Admitting: Family

## 2017-03-01 DIAGNOSIS — J069 Acute upper respiratory infection, unspecified: Secondary | ICD-10-CM | POA: Diagnosis not present

## 2017-03-01 MED ORDER — FLUTICASONE PROPIONATE 50 MCG/ACT NA SUSP: 2.0000 | Freq: Every day | NASAL | 6 refills | Status: DC

## 2017-03-01 MED ORDER — BENZONATATE 100 MG PO CAPS
100.0000 mg | ORAL_CAPSULE | Freq: Three times a day (TID) | ORAL | 0 refills | Status: DC | PRN
Start: 2017-03-01 — End: 2017-05-18

## 2017-03-01 NOTE — Progress Notes (Signed)

## 2017-03-02 ENCOUNTER — Telehealth: Payer: No Typology Code available for payment source | Admitting: Family

## 2017-03-02 DIAGNOSIS — J029 Acute pharyngitis, unspecified: Secondary | ICD-10-CM

## 2017-03-02 DIAGNOSIS — B9689 Other specified bacterial agents as the cause of diseases classified elsewhere: Secondary | ICD-10-CM | POA: Diagnosis not present

## 2017-03-02 DIAGNOSIS — J028 Acute pharyngitis due to other specified organisms: Secondary | ICD-10-CM

## 2017-03-02 MED ORDER — PREDNISONE 5 MG PO TABS: 5.0000 mg | ORAL_TABLET | ORAL | 0 refills | Status: DC

## 2017-03-02 MED ORDER — AZITHROMYCIN 250 MG PO TABS: ORAL_TABLET | ORAL | 0 refills | Status: DC

## 2017-03-02 NOTE — Addendum Note (Signed)
Addended by: Beau FannyWITHROW, Kerri Asche C on: 03/02/2017 04:01 PM   Modules accepted: Orders

## 2017-03-02 NOTE — Progress Notes (Signed)
ADDENDUM: Spoke to pt via telephone and she clarified she has been sick 4-5 days and that her daughter has been sick 7 and is on antibiotics. Pt getting worse. Will give z-pack (pt has taken without incident)  Lori HawJohn [name], DNP, FNP-BC [facility] E-Visit Team

## 2017-03-02 NOTE — Progress Notes (Signed)
Thank you for the details you included in the comment boxes. Those details are very helpful in determining the best course of treatment for you and help us to provide the best care.  We are sorry you are not feeling well.  Here is how we plan to help!  Based on what you have shared with me, it looks like you may have a viral upper respiratory infection or a "common cold".  Colds are caused by a large number of viruses; however, rhinovirus is the most common cause.   Symptoms of the common cold vary from person to person, with common symptoms including sore throat, cough, and malaise.  A low-grade fever of 100.4 may present, but is often uncommon.  Symptoms vary however, and are closely related to a person's age or underlying illnesses.  The most common symptoms associated with the common cold are nasal discharge or congestion, cough, sneezing, headache and pressure in the ears and face.  Cold symptoms usually persist for about 3 to 10 days, but can last up to 2 weeks.  It is important to know that colds do not cause serious illness or complications in most cases.    The common cold is transmitted from person to person, with the most common method of transmission being a person's hands.  The virus is able to live on the skin and can infect other persons for up to 2 hours after direct contact.  Also, colds are transmitted when someone coughs or sneezes; thus, it is important to cover the mouth to reduce this risk.  To keep the spread of the common cold at bay, good hand hygiene is very important.  This is an infection that is most likely caused by a virus. There are no specific treatments for the common cold other than to help you with the symptoms until the infection runs its course.    For nasal congestion, you may use an oral decongestants such as Mucinex D or if you have glaucoma or high blood pressure use plain Mucinex.  Saline nasal spray or nasal drops can help and can safely be used as often as  needed for congestion.  For your congestion, I have prescribed Fluticasone nasal spray one spray in each nostril twice a day  If you do not have a history of heart disease, hypertension, diabetes or thyroid disease, prostate/bladder issues or glaucoma, you may also use Sudafed to treat nasal congestion.  It is highly recommended that you consult with a pharmacist or your primary care physician to ensure this medication is safe for you to take.     If you have a cough, you may use cough suppressants such as Delsym and Robitussin.  If you have glaucoma or high blood pressure, you can also use Coricidin HBP.   For cough I have prescribed for you A prescription cough medication called Tessalon Perles 100 mg. You may take 1-2 capsules every 8 hours as needed for cough   Due to your ear pain, to help relieve the pressure, I am sending a Steroid dose pack.   If you have a sore or scratchy throat, use a saltwater gargle-  to  teaspoon of salt dissolved in a 4-ounce to 8-ounce glass of warm water.  Gargle the solution for approximately 15-30 seconds and then spit.  It is important not to swallow the solution.  You can also use throat lozenges/cough drops and Chloraseptic spray to help with throat pain or discomfort.  Warm or cold liquids can  also be helpful in relieving throat pain.  For headache, pain or general discomfort, you can use Ibuprofen or Tylenol as directed.   Some authorities believe that zinc sprays or the use of Echinacea may shorten the course of your symptoms.   HOME CARE . Only take medications as instructed by your medical team. . Be sure to drink plenty of fluids. Water is fine as well as fruit juices, sodas and electrolyte beverages. You may want to stay away from caffeine or alcohol. If you are nauseated, try taking small sips of liquids. How do you know if you are getting enough fluid? Your urine should be a pale yellow or almost colorless. . Get rest. . Taking a steamy shower or  using a humidifier may help nasal congestion and ease sore throat pain. You can place a towel over your head and breathe in the steam from hot water coming from a faucet. . Using a saline nasal spray works much the same way. . Cough drops, hard candies and sore throat lozenges may ease your cough. . Avoid close contacts especially the very young and the elderly . Cover your mouth if you cough or sneeze . Always remember to wash your hands.   GET HELP RIGHT AWAY IF: . You develop worsening fever. . If your symptoms do not improve within 10 days . You become short of breath. . You develop yellow or green discharge from your nose over 3 days. . You have coughing fits . You develop a severe head ache or visual changes. . You develop shortness of breath or difficulty breathing. . Your symptoms persist after you have completed your treatment plan  MAKE SURE YOU   Understand these instructions.  Will watch your condition.  Will get help right away if you are not doing well or get worse.  Your e-visit answers were reviewed by a board certified advanced clinical practitioner to complete your personal care plan. Depending upon the condition, your plan could have included both over the counter or prescription medications. Please review your pharmacy choice. If there is a problem, you may call our nursing hot line at and have the prescription routed to another pharmacy. Your safety is important to [place]. If you have drug allergies check your prescription carefully.   You can use MyChart to ask questions about today's visit, request a non-urgent call back, or ask for a work or school excuse for 24 hours related to this e-Visit. If it has been greater than 24 hours you will need to follow up with your provider, or enter a new e-Visit to address those concerns. You will get an e-mail in the next two days asking about your experience.  I hope that your e-visit has been valuable and will speed your  recovery. Thank you for using e-visits.

## 2017-04-07 ENCOUNTER — Other Ambulatory Visit: Payer: Self-pay | Admitting: Internal Medicine

## 2017-04-07 NOTE — Telephone Encounter (Signed)
Flexeril     Refilled: 04/19/2016  Metoprolol     Refilled: 02/01/2017  Amitriptyline     Refilled: 08/22/2016  Alprazolam     Refilled: 11/22/2016  Fioricet     Refilled: 06/16/2016  Last OV: 01/26/2016 Next OV: not scheduled

## 2017-04-07 NOTE — Telephone Encounter (Signed)
Printed, signed and faxed.  

## 2017-04-11 ENCOUNTER — Other Ambulatory Visit: Payer: Self-pay | Admitting: Internal Medicine

## 2017-04-11 NOTE — Telephone Encounter (Signed)
Copied from CRM [phone]. Topic: Quick Communication - Rx Refill/Question >> Apr 11, 2017  3:38 PM Oneal GroutSebastian, Jennifer S wrote: Medication: ALPRAZolam Prudy Feeler(XANAX) 0.25 MG tablet  Has the patient contacted their pharmacy? Yes.   (Agent: If no, request that the patient contact the pharmacy for the refill.) Preferred Pharmacy (with phone number or street name): [facility], did not receive prior refill Agent: Please be advised that RX refills may take up to 3 business days. We ask that you follow-up with your pharmacy.

## 2017-04-11 NOTE — Telephone Encounter (Signed)
Rx refill request: Xanax 0.25 mg  LOV: 01/26/16  PCP: Tullo  Pharmacy: verified

## 2017-04-12 MED ORDER — ALPRAZOLAM 0.25 MG PO TABS: ORAL_TABLET | ORAL | 0 refills | Status: DC

## 2017-04-12 NOTE — Telephone Encounter (Signed)
Refill for 30 days only.  OFFICE VISIT NEEDED prior to any more refills 

## 2017-04-14 ENCOUNTER — Telehealth: Payer: Self-pay | Admitting: Internal Medicine

## 2017-04-14 NOTE — Telephone Encounter (Signed)
Patient scheduled 05/19/17.

## 2017-04-14 NOTE — Telephone Encounter (Signed)
Rec'd refill on Alprazolam 0.25 mg; 04/12/17; #30; no refill ( pt. stating pharmacy has not rec'd this Rx; noted Rx was printed) Last office visit; 01/28/16 PCP: Darrick Huntsmanullo [facility]

## 2017-04-14 NOTE — Telephone Encounter (Unsigned)
Copied from CRM #[id number]. Topic: Quick Communication - Rx Refill/Question °>> Apr 11, 2017  3:38 PM Sebastian, Jennifer S wrote: °Medication: ALPRAZolam (XANAX) 0.25 MG tablet  °Has the patient contacted their pharmacy? Yes.   °(Agent: If no, request that the patient contact the pharmacy for the refill.) °Preferred Pharmacy (with phone number or street name): [facility], did not receive prior refill °Agent: Please be advised that RX refills may take up to 3 business days. We ask that you follow-up with your pharmacy. °

## 2017-04-14 NOTE — Telephone Encounter (Signed)
[facility].

## 2017-04-20 ENCOUNTER — Encounter: Payer: Self-pay | Admitting: Internal Medicine

## 2017-04-24 ENCOUNTER — Telehealth: Payer: Self-pay

## 2017-04-24 ENCOUNTER — Telehealth: Payer: Self-pay | Admitting: Internal Medicine

## 2017-04-24 NOTE — Telephone Encounter (Signed)
Copied from CRM [phone]. Topic: Complaint - Care >> Apr 24, 2017  3:39 PM Floria RavelingStovall, Shana A wrote: Date of Incident:  Details of complaint: pt called in and is complaining that she has called 2 times and said no one has called her back.  She wants to speak with the office manager.  She stated med was sent to wrong pharmacy, one that she has never used.  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  She is very upset and is expecting a phone call back today, she also stated that she sent a message to Dr Darrick Huntsmanullo on my chart and she felt like it was not Dr Darrick Huntsmanullo that responded.  It explain to her that she was last seen 01/2016 and Dr Darrick Huntsmanullo WAS the one that respond to her How would the patient like to see it resolved? Speak to office manger  On a scale of 1-10, how was your experience?  What would it take to bring it to a 10? Best number - [phone] Route to Practice Administrator.

## 2017-04-24 NOTE — Telephone Encounter (Signed)
Not sure Why I am  getting these type of e mails about meds getting sent to wrong pharmacy and not being refilled?   Marland Kitchen.  When a patient is denied a refill bc it is a controlled substance and over the 6 month time period, FYI  I expect the patient to be called.   Here is what I sent her today  Ms Lori Douglas  I have  In the past denied your refills on these controlled substances because you need to be seen every 6 months .  The 30 day refill on the alprazolam was a one time courtesy and was [facility]   Regards,   Duncan Dulleresa Eryc Bodey, MD

## 2017-04-25 ENCOUNTER — Telehealth: Payer: Self-pay

## 2017-04-25 NOTE — Telephone Encounter (Signed)
Can you please confirm that in November when I refilled the alprazolam for 30 days,  That the patient was called per my refill request response ?

## 2017-04-25 NOTE — Telephone Encounter (Signed)
Information has been routed to Engineer, manufacturingpractice manager  Copied from CRM [id number]. Topic: Complaint - Care >> Apr 24, 2017  3:39 PM Floria RavelingStovall, Shana A wrote: Date of Incident:  Details of complaint: pt called in and is complaining that she has called 2 times and said no one has called her back.  She wants to speak with the office manager.  She stated med was sent to wrong pharmacy, one that she has never used.  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  She is very upset and is expecting a phone call back today, she also stated that she sent a message to Dr Darrick Huntsmanullo on my chart and she felt like it was not Dr Darrick Huntsmanullo that responded.  It explain to her that she was last seen 01/2016 and Dr Darrick Huntsmanullo WAS the one that respond to her How would the patient like to see it resolved? Speak to office manger  On a scale of 1-10, how was your experience?  What would it take to bring it to a 10? Best number - [phone] Route to Practice Administrator.

## 2017-04-25 NOTE — Telephone Encounter (Signed)
Copied from CRM #[id number]. Topic: Complaint - Care >> Apr 24, 2017  3:39 PM Stovall, Shana A wrote: Date of Incident:  Details of complaint: pt called in and is complaining that she has called 2 times and said no one has called her back.  She wants to speak with the office manager.  She stated med was sent to wrong pharmacy, one that she has never used.  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  She is very upset and is expecting a phone call back today, she also stated that she sent a message to Dr Tullo on my chart and she felt like it was not Dr Tullo that responded.  It explain to her that she was last seen 01/2016 and Dr Tullo WAS the one that respond to her How would the patient like to see it resolved? Speak to office manger  On a scale of 1-10, how was your experience?  What would it take to bring it to a 10? Best number - [phone] Route to Practice Administrator.  

## 2017-05-18 ENCOUNTER — Telehealth: Payer: No Typology Code available for payment source | Admitting: Family

## 2017-05-18 DIAGNOSIS — J028 Acute pharyngitis due to other specified organisms: Secondary | ICD-10-CM

## 2017-05-18 DIAGNOSIS — B9689 Other specified bacterial agents as the cause of diseases classified elsewhere: Secondary | ICD-10-CM

## 2017-05-18 MED ORDER — AZITHROMYCIN 250 MG PO TABS: ORAL_TABLET | ORAL | 0 refills | Status: DC

## 2017-05-18 MED ORDER — BENZONATATE 100 MG PO CAPS: 100.0000 mg | ORAL_CAPSULE | Freq: Three times a day (TID) | ORAL | 0 refills | Status: DC | PRN

## 2017-05-18 NOTE — Progress Notes (Signed)
Thank you for the details you included in the comment boxes. Those details are very helpful in determining the best course of treatment for you and help [place] to provide the best care. This appears to be allergies that have progressed into an infection.  We are sorry that you are not feeling well.  Here is how we plan to help!  Based on your presentation I believe you most likely have A cough due to bacteria.  When patients have a fever and a productive cough with a change in color or increased sputum production, we are concerned about bacterial bronchitis.  If left untreated it can progress to pneumonia.  If your symptoms do not improve with your treatment plan it is important that you contact your provider.   I have prescribed Azithromyin 250 mg: two tablets now and then one tablet daily for 4 additonal days    In addition you may use A non-prescription cough medication called Mucinex DM: take 2 tablets every 12 hours. and A prescription cough medication called Tessalon Perles . You may take 1-2 capsules every 8 hours as needed for your cough.   From your responses in the eVisit questionnaire you describe inflammation in the upper respiratory tract which is causing a significant cough.  This is commonly called Bronchitis and has four common causes:    Allergies  Viral Infections  Acid Reflux  Bacterial Infection Allergies, viruses and acid reflux are treated by controlling symptoms or eliminating the cause. An example might be a cough caused by taking certain blood pressure medications. You stop the cough by changing the medication. Another example might be a cough caused by acid reflux. Controlling the reflux helps control the cough.  USE OF BRONCHODILATOR ("RESCUE") INHALERS: There is a risk from using your bronchodilator too frequently.  The risk is that over-reliance on a medication which only relaxes the muscles surrounding the breathing tubes can reduce the effectiveness of medications  prescribed to reduce swelling and congestion of the tubes themselves.  Although you feel brief relief from the bronchodilator inhaler, your asthma may actually be worsening with the tubes becoming more swollen and filled with mucus.  This can delay other crucial treatments, such as oral steroid medications. If you need to use a bronchodilator inhaler daily, several times per day, you should discuss this with your provider.  There are probably better treatments that could be used to keep your asthma under control.     HOME CARE . Only take medications as instructed by your medical team. . Complete the entire course of an antibiotic. . Drink plenty of fluids and get plenty of rest. . Avoid close contacts especially the very young and the elderly . Cover your mouth if you cough or cough into your sleeve. . Always remember to wash your hands . A steam or ultrasonic humidifier can help congestion.   GET HELP RIGHT AWAY IF: . You develop worsening fever. . You become short of breath . You cough up blood. . Your symptoms persist after you have completed your treatment plan MAKE SURE YOU   Understand these instructions.  Will watch your condition.  Will get help right away if you are not doing well or get worse.     E visit for Allergic Rhinitis We are sorry that you are not feeling well.  Her is how we plan to help!  Based on what you have shared with me it looks like you have Allergic Rhinitis.  Rhinitis is when a  reaction occurs that causes nasal congestion, runny nose, sneezing, and itching.  Most types of rhinitis are caused by an inflammation and are associated with symptoms in the eyes ears or throat. There are several types of rhinitis.  The most common are acute rhinitis, which is usually caused by a viral illness, allergic or seasonal rhinitis, and nonallergic or year-round rhinitis.  Nasal allergies occur certain times of the year.  Allergic rhinitis is caused when allergens in the  air trigger the release of histamine in the body.  Histamine causes itching, swelling, and fluid to build up in the fragile linings of the nasal passages, sinuses and eyelids.  An itchy nose and clear discharge are common.  I recommend the following over the counter treatments: You should take a daily dose of antihistamine and Xyzal 5 mg take 1 tablet daily  I also would recommend a nasal spray: Continue the flonase you are on.  You may also benefit from eye drops such as: Systane 1-2 driops each eye twice daily as needed  HOME CARE:   You can use an over-the-counter saline nasal spray as needed  Avoid areas where there is heavy dust, mites, or molds  Stay indoors on windy days during the pollen season  Keep windows closed in home, at least in bedroom; use air conditioner.  Use high-efficiency house air filter  Keep windows closed in car, turn AC on re-circulate  Avoid playing out with dog during pollen season  GET HELP RIGHT AWAY IF:   If your symptoms do not improve within 10 days  You become short of breath  You develop yellow or green discharge from your nose for over 3 days  You have coughing fits  MAKE SURE YOU:   Understand these instructions  Will watch your condition  Will get help right away if you are not doing well or get worse  Thank you for choosing an e-visit. Your e-visit answers were reviewed by a board certified advanced clinical practitioner to complete your personal care plan. Depending upon the condition, your plan could have included both over the counter or prescription medications. Please review your pharmacy choice. Be sure that the pharmacy you have chosen is open so that you can pick up your prescription now.  If there is a problem you may message your provider in MyChart to have the prescription routed to another pharmacy. Your safety is important to [place]. If you have drug allergies check your prescription carefully.  For the next 24  hours, you can use MyChart to ask questions about today's visit, request a non-urgent call back, or ask for a work or school excuse from your e-visit provider. You will get an email in the next two days asking about your experience. I hope that your e-visit has been valuable and will speed your recovery.

## 2017-05-19 ENCOUNTER — Encounter: Payer: No Typology Code available for payment source | Admitting: Internal Medicine

## 2017-06-14 ENCOUNTER — Telehealth: Payer: Self-pay

## 2017-06-14 NOTE — Telephone Encounter (Signed)
Copied from CRM [phone]. Topic: Appointment Scheduling - Scheduling Inquiry for Clinic >> Jun 14, 2017  9:23 AM Oneal GroutSebastian, Jennifer S wrote: Reason for CRM: Requesting to be worked in for CPE by 07/03/17, needing CPE for insurance by then. Able to work in?

## 2017-06-16 NOTE — Telephone Encounter (Signed)
LMTCB. Please transfer pt to our office.  

## 2017-06-16 NOTE — Telephone Encounter (Signed)
yes

## 2017-06-16 NOTE — Telephone Encounter (Signed)
Is it okay to work pt in for a physical in either an 11:30, 4 or 4:30 available spot before July 1st?

## 2017-06-20 NOTE — Telephone Encounter (Signed)
Spoke with pt and scheduled her for a physical. Pt is aware of appt date and time.

## 2017-06-23 ENCOUNTER — Other Ambulatory Visit (HOSPITAL_COMMUNITY)
Admission: RE | Admit: 2017-06-23 | Discharge: 2017-06-23 | Disposition: A | Discharge status: Home / Self Care | Payer: No Typology Code available for payment source | Source: Ambulatory Visit | Attending: Internal Medicine | Admitting: Internal Medicine

## 2017-06-23 ENCOUNTER — Ambulatory Visit (INDEPENDENT_AMBULATORY_CARE_PROVIDER_SITE_OTHER): Payer: No Typology Code available for payment source | Admitting: Internal Medicine

## 2017-06-23 ENCOUNTER — Encounter: Payer: Self-pay | Admitting: Internal Medicine

## 2017-06-23 VITALS — BP 110/72 | HR 109 | Temp 98.2°F | Resp 15 | Ht 63.0 in | Wt 154.0 lb

## 2017-06-23 DIAGNOSIS — R519 Headache, unspecified: Secondary | ICD-10-CM

## 2017-06-23 DIAGNOSIS — Z124 Encounter for screening for malignant neoplasm of cervix: Secondary | ICD-10-CM | POA: Insufficient documentation

## 2017-06-23 DIAGNOSIS — E559 Vitamin D deficiency, unspecified: Secondary | ICD-10-CM | POA: Diagnosis not present

## 2017-06-23 DIAGNOSIS — R5383 Other fatigue: Secondary | ICD-10-CM

## 2017-06-23 DIAGNOSIS — L409 Psoriasis, unspecified: Secondary | ICD-10-CM | POA: Diagnosis not present

## 2017-06-23 DIAGNOSIS — E782 Mixed hyperlipidemia: Secondary | ICD-10-CM | POA: Diagnosis not present

## 2017-06-23 DIAGNOSIS — R51 Headache: Secondary | ICD-10-CM | POA: Diagnosis not present

## 2017-06-23 DIAGNOSIS — Z0000 Encounter for general adult medical examination without abnormal findings: Secondary | ICD-10-CM

## 2017-06-23 DIAGNOSIS — F5102 Adjustment insomnia: Secondary | ICD-10-CM

## 2017-06-23 MED ORDER — BUTALBITAL-APAP-CAFFEINE 50-325-40 MG PO TABS: ORAL_TABLET | ORAL | 5 refills | Status: DC

## 2017-06-23 MED ORDER — ALPRAZOLAM 0.25 MG PO TABS: ORAL_TABLET | ORAL | 5 refills | Status: DC

## 2017-06-23 NOTE — Progress Notes (Signed)
Patient ID: Lori Douglas, female    DOB: January 09, 1985  Age: 32 y.o. MRN: [id number]  The patient is here for annual preventive examination and management of other chronic and acute problems.  Last seen January 2018   The risk factors are reflected in the social history.  The roster of all physicians providing medical care to patient - is listed in the Snapshot section of the chart.  Activities of daily living:  The patient is 100% independent in all ADLs: dressing, toileting, feeding as well as independent mobility  Home safety : The patient has smoke detectors in the home. They wear seatbelts.  There are no firearms at home. There is no violence in the home.   There is no risks for hepatitis, STDs or HIV. There is no   history of blood transfusion. They have no travel history to infectious disease endemic areas of the world.  The patient has seen their dentist in the last six month. They have seen their eye doctor in the last year. They admit to slight hearing difficulty with regard to whispered voices and some television programs.  They have deferred audiologic testing in the last year.  They do not  have excessive sun exposure. Discussed the need for sun protection: hats, long sleeves and use of sunscreen if there is significant sun exposure.   Diet: the importance of a healthy diet is discussed. They do have a healthy diet.  The benefits of regular aerobic exercise were discussed. She is not exercising .  Depression screen: there are no signs or vegative symptoms of depression- irritability, change in appetite, anhedonia, sadness/tearfullness.   The following portions of the patient's history were reviewed and updated as appropriate: allergies, current medications, past family history, past medical history,  past surgical history, past social history  and problem list.  Visual acuity was not assessed per patient preference since she has regular follow up with her ophthalmologist.  Hearing and body mass index were assessed and reviewed.   During the course of the visit the patient was educated and counseled about appropriate screening and preventive services including : fall prevention , diabetes screening, nutrition counseling, colorectal cancer screening, and recommended immunizations.    CC: The primary encounter diagnosis was Screening for cervical cancer. Diagnoses of Psoriasis, Fatigue, unspecified type, Mixed hyperlipidemia, Vitamin D deficiency, Encounter for preventive health examination, Headache, chronic daily, and Insomnia due to psychological stress were also pertinent to this visit.  Multiple  family stressors since last visit.  Son Alan MulderLiam is 764 yrs old and has been to several specialists after having an  episode of serum sickness.  Work up is  still progress  Father was hospitalized  again for respiratory failure secondary to COPD and ongoing tobacco abuse  Had an unsatisfying dermatology evaluation by [facility], requests  referral to Willeen Nieceara Stewart at [facility] for psoriasis .   Refill requests for fioricet and alprazolam were delayed due to being lost to follow up and complicated by being sent to wrong pharmacy.Marland Kitchen.  5 minutes of visit spend discussing this particular issue, which was so egregious that she planned to change providers.   History Merry ProudBrandi has a past medical history of Frequent headaches, Heart murmur, Migraine, Preeclampsia, and Psoriasis.   She has a past surgical history that includes Cholecystectomy (2015) and Cesarean section (2015).   Her family history includes Alcohol abuse in her paternal grandfather; Anxiety disorder in her father and mother; Arthritis in her father, maternal grandmother, and mother;  Cancer in her maternal grandmother and paternal grandmother; Depression in her father; Diabetes in her paternal aunt; Endometriosis in her sister; Heart disease in her father and maternal grandmother; Hodgkin's lymphoma in her  father; Hyperlipidemia in her father; Hypertension in her father and maternal grandmother; Psoriasis in her brother; Stroke in her maternal grandmother.She reports that she has never smoked. She has never used smokeless tobacco. She reports that she does not drink alcohol or use drugs.  Outpatient Medications Prior to Visit  Medication Sig Dispense Refill  . albuterol (PROVENTIL HFA) 108 (90 Base) MCG/ACT inhaler Inhale 2 puffs every 6 (six) hours as needed into the lungs for wheezing or shortness of breath. 1 Inhaler 1  . amitriptyline (ELAVIL) 50 MG tablet TAKE 1 TABLET BY MOUTH AT BEDTIME. 90 tablet 1  . cyclobenzaprine (FLEXERIL) 5 MG tablet TAKE 1 TABLET (5 MG TOTAL) BY MOUTH 3 (THREE) TIMES DAILY AS NEEDED FOR MUSCLE SPASMS. 90 tablet 1  . fluticasone (FLONASE) 50 MCG/ACT nasal spray Place 2 sprays into both nostrils daily. 16 g 6  . metoprolol succinate (TOPROL-XL) 25 MG 24 hr tablet TAKE 1 TABLET BY MOUTH DAILY 30 tablet 1  . traZODone (DESYREL) 50 MG tablet TAKE 1/2 TO 1 TABLET BY MOUTH AT BEDTIME AS NEEDED FOR SLEEP. 30 tablet 2  . ALPRAZolam (XANAX) 0.25 MG tablet TAKE ONE TABLET BY MOUTH EACH NIGHT AT BEDTIME AS NEEDED FOR SLEEP 30 tablet 0  . azithromycin (ZITHROMAX) 250 MG tablet Take 2 tabs now then 1 daily times 4 days (Patient not taking: Reported on 06/23/2017) 6 tablet 0  . benzonatate (TESSALON PERLES) 100 MG capsule Take 1-2 capsules (100-200 mg total) by mouth every 8 (eight) hours as needed for cough. (Patient not taking: Reported on 06/23/2017) 30 capsule 0  . butalbital-acetaminophen-caffeine (FIORICET, ESGIC) 50-325-40 MG tablet TAKE 1 TO 2 TABLETS BY MOUTH EVERY 6 HOURS AS NEEDED FOR HEADACHE. MAX OF 4 TABLETS DAILY. 60 tablet 0  . cefdinir (OMNICEF) 300 MG capsule Take 1 capsule (300 mg total) by mouth 2 (two) times daily. 1 po BID (Patient not taking: Reported on 06/23/2017) 20 capsule 0  . fluconazole (DIFLUCAN) 150 MG tablet Take 1 tablet (150 mg total) by mouth daily.  (Patient not taking: Reported on 06/23/2017) 2 tablet 1  . oseltamivir (TAMIFLU) 75 MG capsule Take 1 capsule (75 mg total) by mouth daily. (Patient not taking: Reported on 06/23/2017) 10 capsule 0  . predniSONE (DELTASONE) 5 MG tablet Take 1 tablet (5 mg total) by mouth as directed. Taper 6,5,4,3,2,1 (Patient not taking: Reported on 06/23/2017) 21 tablet 0   No facility-administered medications prior to visit.     Review of Systems   Patient denies headache, fevers, malaise, unintentional weight loss, skin rash, eye pain, sinus congestion and sinus pain, sore throat, dysphagia,  hemoptysis , cough, dyspnea, wheezing, chest pain, palpitations, orthopnea, edema, abdominal pain, nausea, melena, diarrhea, constipation, flank pain, dysuria, hematuria, urinary  Frequency, nocturia, numbness, tingling, seizures,  Focal weakness, Loss of consciousness,  Tremor, , depression,and suicidal ideation.      Objective:  BP 110/72 (BP Location: Left Arm, Patient Position: Sitting, Cuff Size: Normal)   Pulse (!) 109   Temp 98.2 F (36.8 C) (Oral)   Resp 15   Ht 5\' 3"  (1.6 m)   Wt 154 lb (69.9 kg)   SpO2 99%   BMI 27.28 kg/m   Physical Exam   General Appearance:    Alert, cooperative, no distress, appears stated age  Head:    Normocephalic, without obvious abnormality, atraumatic  Eyes:    PERRL, conjunctiva/corneas clear, EOM's intact, fundi    benign, both eyes  Ears:    Normal TM's and external ear canals, both ears  Nose:   Nares normal, septum midline, mucosa normal, no drainage    or sinus tenderness  Throat:   Lips, mucosa, and tongue normal; teeth and gums normal  Neck:   Supple, symmetrical, trachea midline, no adenopathy;    thyroid:  no enlargement/tenderness/nodules; no carotid   bruit or JVD  Back:     Symmetric, no curvature, ROM normal, no CVA tenderness  Lungs:     Clear to auscultation bilaterally, respirations unlabored  Chest Wall:    No tenderness or deformity   Heart:     Regular rate and rhythm, S1 and S2 normal, no murmur, rub   or gallop  Breast Exam:    No tenderness, masses, or nipple abnormality  Abdomen:     Soft, non-tender, bowel sounds active all four quadrants,    no masses, no organomegaly  Genitalia:    Pelvic: cervix normal in appearance, external genitalia normal, no adnexal masses or tenderness, no cervical motion tenderness, rectovaginal septum normal, uterus normal size, shape, and consistency and vagina normal without discharge  Extremities:   Extremities normal, atraumatic, no cyanosis or edema  Pulses:   2+ and symmetric all extremities  Skin:   Skin color, texture, turgor normal, no rashes or lesions  Lymph nodes:   Cervical, supraclavicular, and axillary nodes normal  Neurologic:   CNII-XII intact, normal strength, sensation and reflexes    throughout      Assessment & Plan:   Problem List Items Addressed This Visit    Psoriasis    She had a recent flare that she states was misdiagnosed as a bug bite by [facility].  She is requesting referral to [facility] to see Willeen Niece       Relevant Orders   Ambulatory referral to Dermatology   Insomnia due to psychological stress    Managed with trazodone and prn use of alprazolam . Refills given       Headache, chronic daily    Her headaches were recurring  Less frequently until her son became ill . She has been managing them with regular massage therapy and occasional use of fioricet .  Refills given       Relevant Medications   butalbital-acetaminophen-caffeine (FIORICET, ESGIC) 50-325-40 MG tablet   Encounter for preventive health examination    Annual comprehensive preventive exam was done as well as an evaluation and management of chronic conditions .  During the course of the visit the patient was educated and counseled about appropriate screening and preventive services including :  diabetes screening, lipid analysis with projected  10 year  risk for CAD ,  nutrition counseling, breast, cervical and colorectal cancer screening, and recommended immunizations.  Printed recommendations for health maintenance screenings was given       Other Visit Diagnoses    Screening for cervical cancer    -  Primary   Relevant Orders   Cytology - PAP   Fatigue, unspecified type       Relevant Orders   Comprehensive metabolic panel   TSH   CBC with Differential/Platelet   Iron, TIBC and Ferritin Panel   Mixed hyperlipidemia       Relevant Orders   Lipid panel   Vitamin D deficiency  Relevant Orders   VITAMIN D 25 Hydroxy (Vit-D Deficiency, Fractures)      I have discontinued Lemon N. Balandran's oseltamivir, fluconazole, cefdinir, predniSONE, azithromycin, and benzonatate. I am also having her maintain her traZODone, albuterol, fluticasone, amitriptyline, metoprolol succinate, cyclobenzaprine, ALPRAZolam, and butalbital-acetaminophen-caffeine.  Meds ordered this encounter  Medications  . ALPRAZolam (XANAX) 0.25 MG tablet    Sig: TAKE ONE TABLET BY MOUTH EACH NIGHT AT BEDTIME AS NEEDED FOR SLEEP    Dispense:  30 tablet    Refill:  5  . butalbital-acetaminophen-caffeine (FIORICET, ESGIC) 50-325-40 MG tablet    Sig: TAKE 1 TO 2 TABLETS BY MOUTH EVERY 6 HOURS AS NEEDED FOR HEADACHE. MAX OF 4 TABLETS DAILY.    Dispense:  60 tablet    Refill:  5    Medications Discontinued During This Encounter  Medication Reason  . ALPRAZolam (XANAX) 0.25 MG tablet Reorder  . butalbital-acetaminophen-caffeine (FIORICET, ESGIC) 50-325-40 MG tablet Reorder  . azithromycin (ZITHROMAX) 250 MG tablet Patient has not taken in last 30 days  . benzonatate (TESSALON PERLES) 100 MG capsule Patient has not taken in last 30 days  . cefdinir (OMNICEF) 300 MG capsule Patient has not taken in last 30 days  . fluconazole (DIFLUCAN) 150 MG tablet Completed Course  . oseltamivir (TAMIFLU) 75 MG capsule Completed Course  . predniSONE (DELTASONE) 5 MG tablet Completed Course     Follow-up: Return in about 6 months (around 12/23/2017) for med refill .   Sherlene Shams, MD

## 2017-06-23 NOTE — Patient Instructions (Signed)
Your referral to Dr Nicole Kindred is in process  You can return for fasting labs at your leisure  Health Maintenance, Female Adopting a healthy lifestyle and getting preventive care can go a long way to promote health and wellness. Talk with your health care provider about what schedule of regular examinations is right for you. This is a good chance for you to check in with your provider about disease prevention and staying healthy. In between checkups, there are plenty of things you can do on your own. Experts have done a lot of research about which lifestyle changes and preventive measures are most likely to keep you healthy. Ask your health care provider for more information. Weight and diet Eat a healthy diet  Be sure to include plenty of vegetables, fruits, low-fat dairy products, and lean protein.  Do not eat a lot of foods high in solid fats, added sugars, or salt.  Get regular exercise. This is one of the most important things you can do for your health. ? Most adults should exercise for at least 150 minutes each week. The exercise should increase your heart rate and make you sweat (moderate-intensity exercise). ? Most adults should also do strengthening exercises at least twice a week. This is in addition to the moderate-intensity exercise.  Maintain a healthy weight  Body mass index (BMI) is a measurement that can be used to identify possible weight problems. It estimates body fat based on height and weight. Your health care provider can help determine your BMI and help you achieve or maintain a healthy weight.  For females 4 years of age and older: ? A BMI below 18.5 is considered underweight. ? A BMI of 18.5 to 24.9 is normal. ? A BMI of 25 to 29.9 is considered overweight. ? A BMI of 30 and above is considered obese.  Watch levels of cholesterol and blood lipids  You should start having your blood tested for lipids and cholesterol at 30 years of age, then have this test every 5  years.  You may need to have your cholesterol levels checked more often if: ? Your lipid or cholesterol levels are high. ? You are older than 30 years of age. ? You are at high risk for heart disease.  Cancer screening Lung Cancer  Lung cancer screening is recommended for adults 14-29 years old who are at high risk for lung cancer because of a history of smoking.  A yearly low-dose CT scan of the lungs is recommended for people who: ? Currently smoke. ? Have quit within the past 15 years. ? Have at least a 30-pack-year history of smoking. A pack year is smoking an average of one pack of cigarettes a day for 1 year.  Yearly screening should continue until it has been 15 years since you quit.  Yearly screening should stop if you develop a health problem that would prevent you from having lung cancer treatment.  Breast Cancer  Practice breast self-awareness. This means understanding how your breasts normally appear and feel.  It also means doing regular breast self-exams. Let your health care provider know about any changes, no matter how small.  If you are in your 20s or 30s, you should have a clinical breast exam (CBE) by a health care provider every 1-3 years as part of a regular health exam.  If you are 59 or older, have a CBE every year. Also consider having a breast X-ray (mammogram) every year.  If you have a family history  of breast cancer, talk to your health care provider about genetic screening.  If you are at high risk for breast cancer, talk to your health care provider about having an MRI and a mammogram every year.  Breast cancer gene (BRCA) assessment is recommended for women who have family members with BRCA-related cancers. BRCA-related cancers include: ? Breast. ? Ovarian. ? Tubal. ? Peritoneal cancers.  Results of the assessment will determine the need for genetic counseling and BRCA1 and BRCA2 testing.  Cervical Cancer Your health care provider may  recommend that you be screened regularly for cancer of the pelvic organs (ovaries, uterus, and vagina). This screening involves a pelvic examination, including checking for microscopic changes to the surface of your cervix (Pap test). You may be encouraged to have this screening done every 3 years, beginning at age 21.  For women ages 30-65, health care providers may recommend pelvic exams and Pap testing every 3 years, or they may recommend the Pap and pelvic exam, combined with testing for human papilloma virus (HPV), every 5 years. Some types of HPV increase your risk of cervical cancer. Testing for HPV may also be done on women of any age with unclear Pap test results.  Other health care providers may not recommend any screening for nonpregnant women who are considered low risk for pelvic cancer and who do not have symptoms. Ask your health care provider if a screening pelvic exam is right for you.  If you have had past treatment for cervical cancer or a condition that could lead to cancer, you need Pap tests and screening for cancer for at least 20 years after your treatment. If Pap tests have been discontinued, your risk factors (such as having a new sexual partner) need to be reassessed to determine if screening should resume. Some women have medical problems that increase the chance of getting cervical cancer. In these cases, your health care provider may recommend more frequent screening and Pap tests.  Colorectal Cancer  This type of cancer can be detected and often prevented.  Routine colorectal cancer screening usually begins at 30 years of age and continues through 30 years of age.  Your health care provider may recommend screening at an earlier age if you have risk factors for colon cancer.  Your health care provider may also recommend using home test kits to check for hidden blood in the stool.  A small camera at the end of a tube can be used to examine your colon directly  (sigmoidoscopy or colonoscopy). This is done to check for the earliest forms of colorectal cancer.  Routine screening usually begins at age 50.  Direct examination of the colon should be repeated every 5-10 years through 30 years of age. However, you may need to be screened more often if early forms of precancerous polyps or small growths are found.  Skin Cancer  Check your skin from head to toe regularly.  Tell your health care provider about any new moles or changes in moles, especially if there is a change in a mole's shape or color.  Also tell your health care provider if you have a mole that is larger than the size of a pencil eraser.  Always use sunscreen. Apply sunscreen liberally and repeatedly throughout the day.  Protect yourself by wearing long sleeves, pants, a wide-brimmed hat, and sunglasses whenever you are outside.  Heart disease, diabetes, and high blood pressure  High blood pressure causes heart disease and increases the risk of stroke. High   blood pressure is more likely to develop in: ? People who have blood pressure in the high end of the normal range (130-139/85-89 mm Hg). ? People who are overweight or obese. ? People who are African American.  If you are 18-39 years of age, have your blood pressure checked every 3-5 years. If you are 40 years of age or older, have your blood pressure checked every year. You should have your blood pressure measured twice-once when you are at a hospital or clinic, and once when you are not at a hospital or clinic. Record the average of the two measurements. To check your blood pressure when you are not at a hospital or clinic, you can use: ? An automated blood pressure machine at a pharmacy. ? A home blood pressure monitor.  If you are between 55 years and 79 years old, ask your health care provider if you should take aspirin to prevent strokes.  Have regular diabetes screenings. This involves taking a blood sample to check your  fasting blood sugar level. ? If you are at a normal weight and have a low risk for diabetes, have this test once every three years after 30 years of age. ? If you are overweight and have a high risk for diabetes, consider being tested at a younger age or more often. Preventing infection Hepatitis B  If you have a higher risk for hepatitis B, you should be screened for this virus. You are considered at high risk for hepatitis B if: ? You were born in a country where hepatitis B is common. Ask your health care provider which countries are considered high risk. ? Your parents were born in a high-risk country, and you have not been immunized against hepatitis B (hepatitis B vaccine). ? You have HIV or AIDS. ? You use needles to inject street drugs. ? You live with someone who has hepatitis B. ? You have had sex with someone who has hepatitis B. ? You get hemodialysis treatment. ? You take certain medicines for conditions, including cancer, organ transplantation, and autoimmune conditions.  Hepatitis C  Blood testing is recommended for: ? Everyone born from 1945 through 1965. ? Anyone with known risk factors for hepatitis C.  Sexually transmitted infections (STIs)  You should be screened for sexually transmitted infections (STIs) including gonorrhea and chlamydia if: ? You are sexually active and are younger than 30 years of age. ? You are older than 30 years of age and your health care provider tells you that you are at risk for this type of infection. ? Your sexual activity has changed since you were last screened and you are at an increased risk for chlamydia or gonorrhea. Ask your health care provider if you are at risk.  If you do not have HIV, but are at risk, it may be recommended that you take a prescription medicine daily to prevent HIV infection. This is called pre-exposure prophylaxis (PrEP). You are considered at risk if: ? You are sexually active and do not regularly use condoms  or know the HIV status of your partner(s). ? You take drugs by injection. ? You are sexually active with a partner who has HIV.  Talk with your health care provider about whether you are at high risk of being infected with HIV. If you choose to begin PrEP, you should first be tested for HIV. You should then be tested every 3 months for as long as you are taking PrEP. Pregnancy  If you are   premenopausal and you may become pregnant, ask your health care provider about preconception counseling.  If you may become pregnant, take 400 to 800 micrograms (mcg) of folic acid every day.  If you want to prevent pregnancy, talk to your health care provider about birth control (contraception). Osteoporosis and menopause  Osteoporosis is a disease in which the bones lose minerals and strength with aging. This can result in serious bone fractures. Your risk for osteoporosis can be identified using a bone density scan.  If you are 41 years of age or older, or if you are at risk for osteoporosis and fractures, ask your health care provider if you should be screened.  Ask your health care provider whether you should take a calcium or vitamin D supplement to lower your risk for osteoporosis.  Menopause may have certain physical symptoms and risks.  Hormone replacement therapy may reduce some of these symptoms and risks. Talk to your health care provider about whether hormone replacement therapy is right for you. Follow these instructions at home:  Schedule regular health, dental, and eye exams.  Stay current with your immunizations.  Do not use any tobacco products including cigarettes, chewing tobacco, or electronic cigarettes.  If you are pregnant, do not drink alcohol.  If you are breastfeeding, limit how much and how often you drink alcohol.  Limit alcohol intake to no more than 1 drink per day for nonpregnant women. One drink equals 12 ounces of beer, 5 ounces of wine, or 1 ounces of hard  liquor.  Do not use street drugs.  Do not share needles.  Ask your health care provider for help if you need support or information about quitting drugs.  Tell your health care provider if you often feel depressed.  Tell your health care provider if you have ever been abused or do not feel safe at home. This information is not intended to replace advice given to you by your health care provider. Make sure you discuss any questions you have with your health care provider. Document Released: 07/05/2010 Document Revised: 05/28/2015 Document Reviewed: 09/23/2014 Elsevier Interactive Patient Education  [employer].

## 2017-06-25 DIAGNOSIS — Z0000 Encounter for general adult medical examination without abnormal findings: Secondary | ICD-10-CM | POA: Insufficient documentation

## 2017-06-25 IMAGING — CT CT ABD-PELV W/O CM
3 of 4 series · 8 of 46 positions shown, 15 images · non-contrast
Comparison: None.

CLINICAL DATA: Left lower quadrant pain radiating to the back for 2
days. Hematuria.

EXAM:
CT ABDOMEN AND PELVIS WITHOUT CONTRAST
TECHNIQUE: Multidetector CT imaging of the abdomen and pelvis was performed
following the standard protocol without IV contrast.

[Series 4: lung · axial · 0.67mm/px · z∈[-135,-75]mm · 4 of 22 slices shown, 9 images]
[im 5/22  soft-tissue]
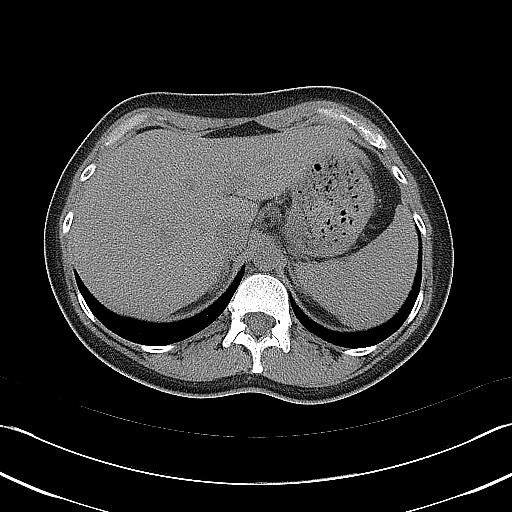
[im 5/22  lung]
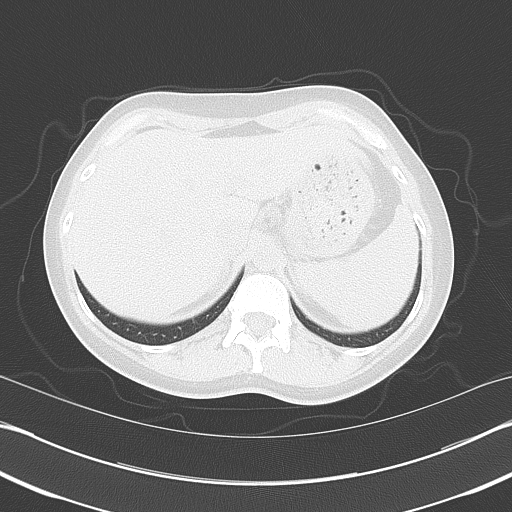
[im 5/22  bone]
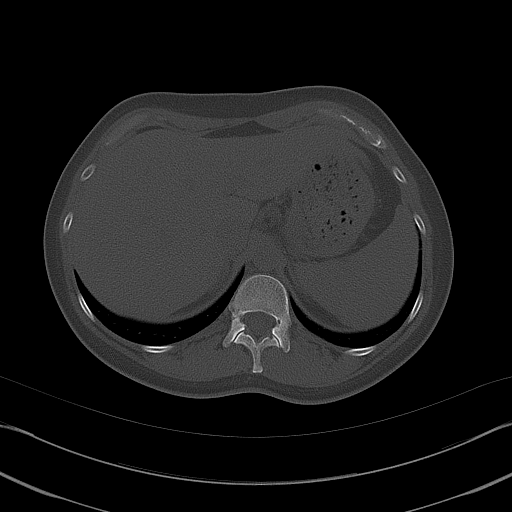
[im 9/22  soft-tissue]
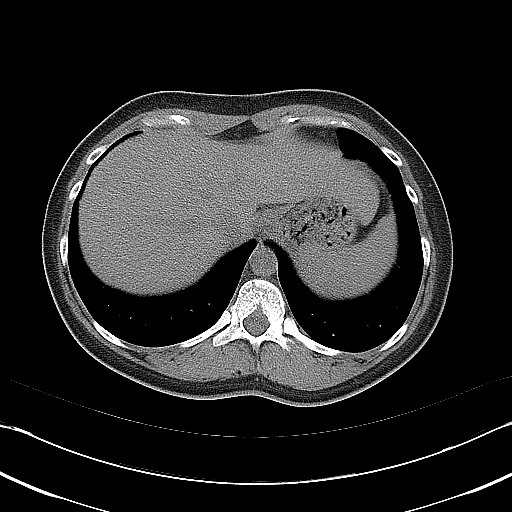
[im 9/22  lung]
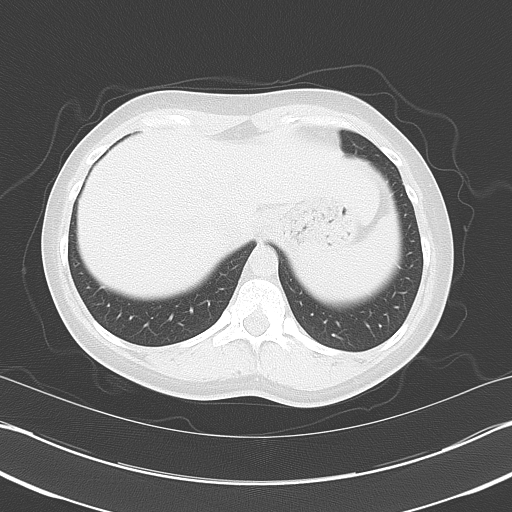
[im 13/22  soft-tissue]
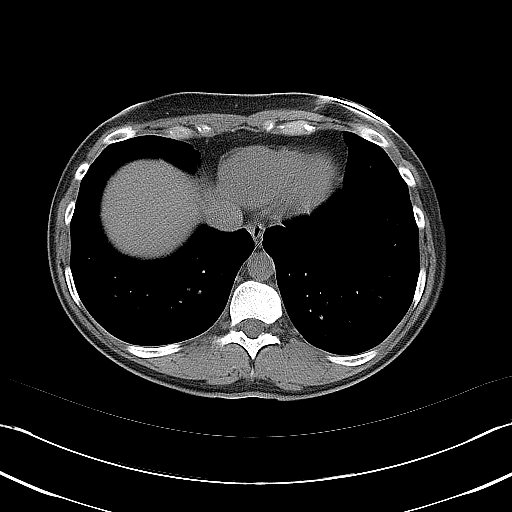
[im 13/22  lung]
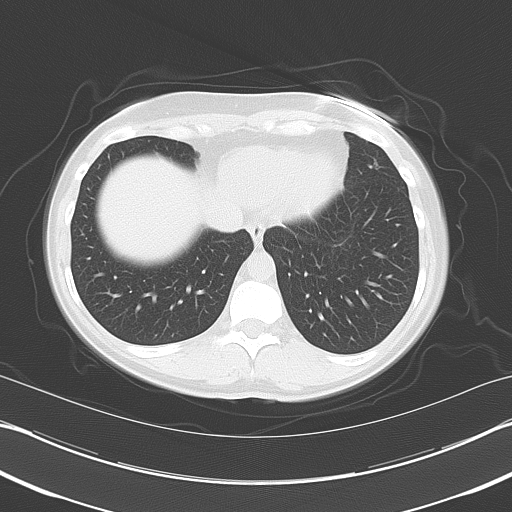
[im 17/22  soft-tissue]
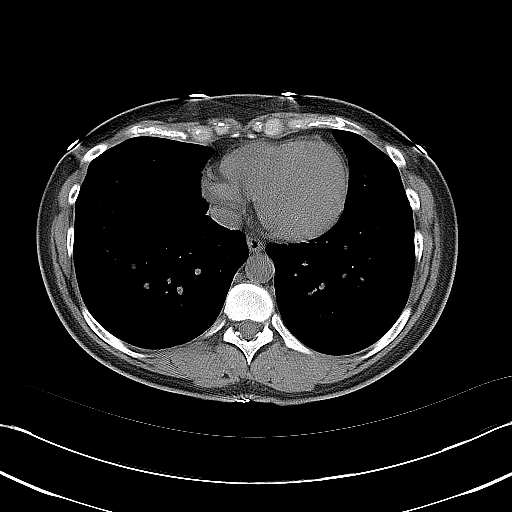
[im 17/22  lung]
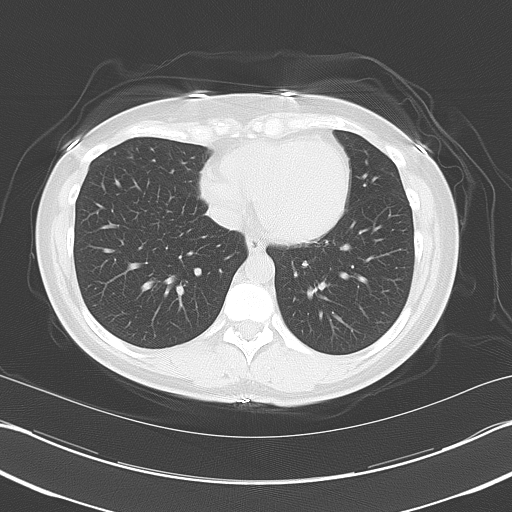

[Series 5: coronal · coronal · 0.63mm/px · 3 of 113 slices shown, 4 images]
[im 38/113  soft-tissue]
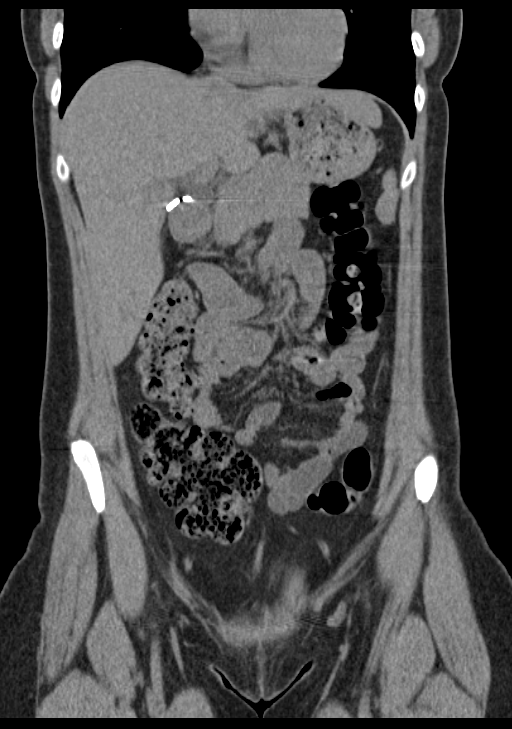
[im 50/113  soft-tissue]
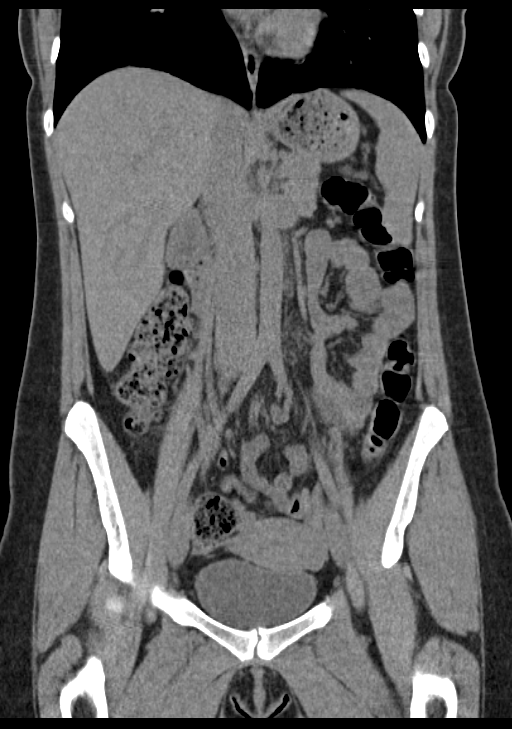
[im 50/113  bone]
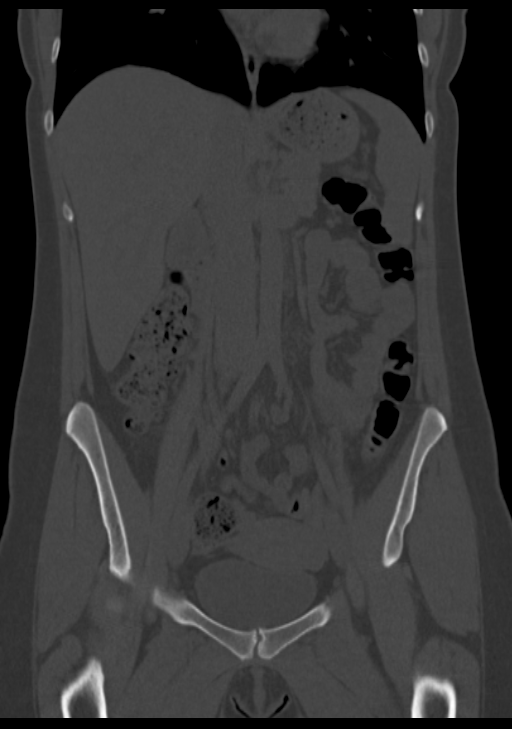
[im 63/113  soft-tissue]
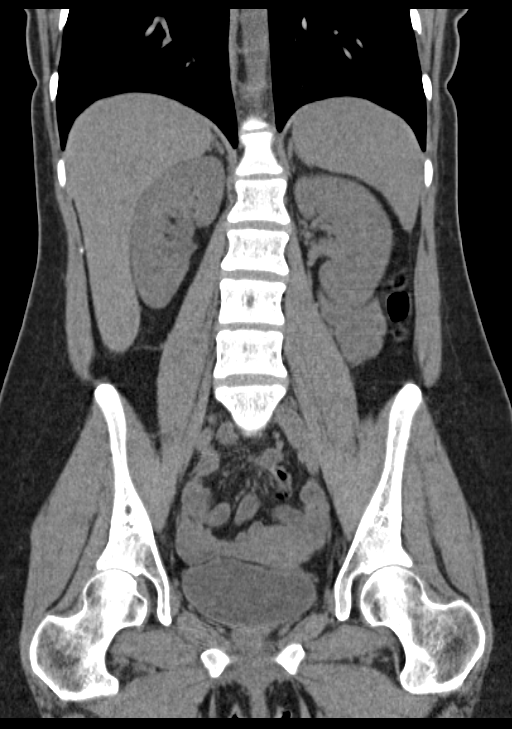

[Series 6: sagittal · sagittal · 0.50mm/px · 1 of 160 slices shown, 2 images]
[im 54/160  soft-tissue]
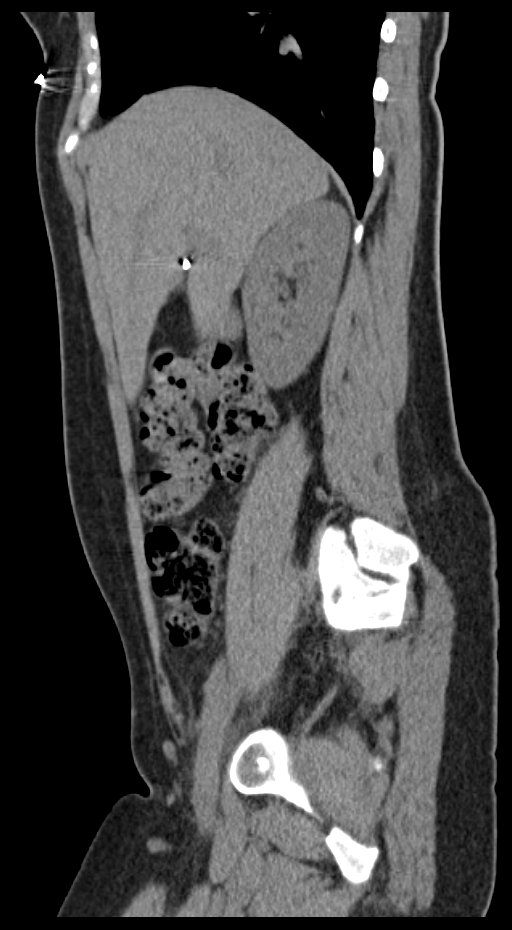
[im 54/160  bone]
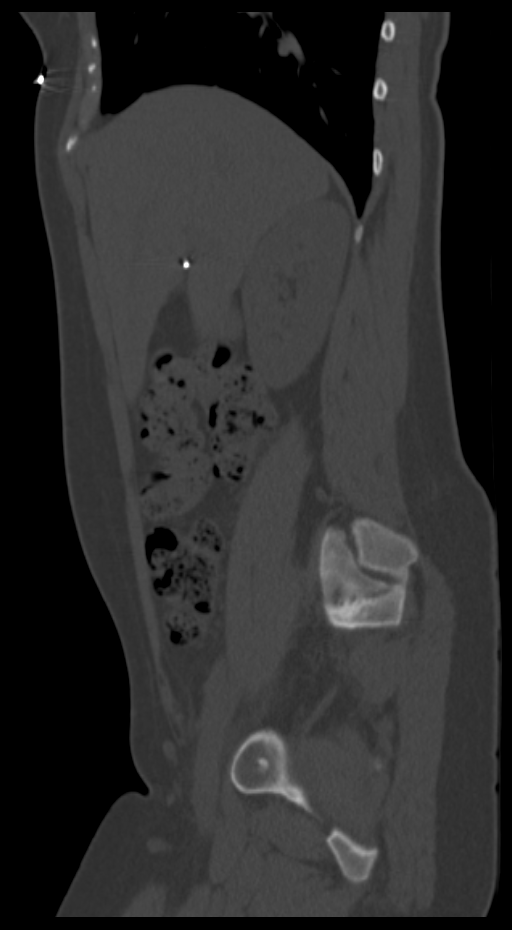

[8 of 46 positions shown; findings below may reference images not displayed]

FINDINGS: Lung bases:  Clear.  Heart normal size.

Hepatobiliary: Normal liver. Gallbladder surgically absent. No bile
duct dilation.

Spleen, pancreas, adrenal glands:  Normal.

Kidneys, ureters, bladder:  Normal.

Uterus and adnexa:  Normal.

Lymph nodes:  No adenopathy.

Ascites:  None.

Gastrointestinal:  Normal.  Normal appendix visualized.

Musculoskeletal:  Normal.
IMPRESSION: 1. No acute findings in the abdomen pelvis. No findings to explain
the patient's symptoms. No renal or ureteral stones or obstructive
uropathy.
2. Status post cholecystectomy.  No other abnormalities.

## 2017-06-25 NOTE — Assessment & Plan Note (Signed)
She had a recent flare that she states was misdiagnosed as a bug bite by [facility].  She is requesting referral to [facility] to see Willeen Nieceara Stewart

## 2017-06-25 NOTE — Assessment & Plan Note (Signed)
Annual comprehensive preventive exam was done as well as an evaluation and management of chronic conditions .  During the course of the visit the patient was educated and counseled about appropriate screening and preventive services including :  diabetes screening, lipid analysis with projected  10 year  risk for CAD , nutrition counseling, breast, cervical and colorectal cancer screening, and recommended immunizations.  Printed recommendations for health maintenance screenings was given 

## 2017-06-25 NOTE — Assessment & Plan Note (Signed)
Managed with trazodone and prn use of alprazolam . Refills given

## 2017-06-25 NOTE — Assessment & Plan Note (Signed)
Her headaches were recurring  Less frequently until her son became ill . She has been managing them with regular massage therapy and occasional use of fioricet .  Refills given

## 2017-06-27 LAB — CYTOLOGY - PAP
DIAGNOSIS: NEGATIVE
HPV: NOT DETECTED

## 2017-07-09 ENCOUNTER — Telehealth: Payer: No Typology Code available for payment source | Admitting: Family

## 2017-07-09 DIAGNOSIS — R399 Unspecified symptoms and signs involving the genitourinary system: Secondary | ICD-10-CM | POA: Diagnosis not present

## 2017-07-09 MED ORDER — CIPROFLOXACIN HCL 500 MG PO TABS: 500.0000 mg | ORAL_TABLET | Freq: Two times a day (BID) | ORAL | 0 refills | Status: DC

## 2017-07-09 NOTE — Progress Notes (Signed)

## 2017-07-10 ENCOUNTER — Other Ambulatory Visit (INDEPENDENT_AMBULATORY_CARE_PROVIDER_SITE_OTHER): Payer: No Typology Code available for payment source

## 2017-07-10 DIAGNOSIS — E559 Vitamin D deficiency, unspecified: Secondary | ICD-10-CM | POA: Diagnosis not present

## 2017-07-10 DIAGNOSIS — E782 Mixed hyperlipidemia: Secondary | ICD-10-CM

## 2017-07-10 DIAGNOSIS — R5383 Other fatigue: Secondary | ICD-10-CM

## 2017-07-10 LAB — LIPID PANEL
Cholesterol: 103 mg/dL (ref 0–200)
HDL: 49.6 mg/dL (ref >39.00)
LDL Cholesterol: 21 mg/dL (ref 0–99)
NonHDL: 53.61
TRIGLYCERIDES: 162 mg/dL — AB (ref 0.0–149.0)
Total CHOL/HDL Ratio: 2
VLDL: 32.4 mg/dL (ref 0.0–40.0)

## 2017-07-10 LAB — COMPREHENSIVE METABOLIC PANEL
ALBUMIN: 4.1 g/dL (ref 3.5–5.2)
ALT: 20 U/L (ref 0–35)
AST: 18 U/L (ref 0–37)
Alkaline Phosphatase: 57 U/L (ref 39–117)
BUN: 11 mg/dL (ref 6–23)
CALCIUM: 9 mg/dL (ref 8.4–10.5)
CHLORIDE: 101 meq/L (ref 96–112)
CO2: 30 meq/L (ref 19–32)
Creatinine, Ser: 0.73 mg/dL (ref 0.40–1.20)
GFR: 99.67 mL/min (ref >60.00)
Glucose, Bld: 95 mg/dL (ref 70–99)
POTASSIUM: 4.1 meq/L (ref 3.5–5.1)
Sodium: 137 mEq/L (ref 135–145)
Total Bilirubin: 0.6 mg/dL (ref 0.2–1.2)
Total Protein: 6.6 g/dL (ref 6.0–8.3)

## 2017-07-10 LAB — CBC WITH DIFFERENTIAL/PLATELET
BASOS ABS: 0 10*3/uL (ref 0.0–0.1)
Basophils Relative: 0.7 % (ref 0.0–3.0)
Eosinophils Absolute: 0.2 10*3/uL (ref 0.0–0.7)
Eosinophils Relative: 3.7 % (ref 0.0–5.0)
HEMATOCRIT: 40.6 % (ref 36.0–46.0)
Hemoglobin: 13.6 g/dL (ref 12.0–15.0)
LYMPHS ABS: 2.1 10*3/uL (ref 0.7–4.0)
LYMPHS PCT: 34.1 % (ref 12.0–46.0)
MCHC: 33.6 g/dL (ref 30.0–36.0)
MCV: 88.6 fl (ref 78.0–100.0)
MONOS PCT: 5.1 % (ref 3.0–12.0)
Monocytes Absolute: 0.3 10*3/uL (ref 0.1–1.0)
NEUTROS ABS: 3.4 10*3/uL (ref 1.4–7.7)
Neutrophils Relative %: 56.4 % (ref 43.0–77.0)
PLATELETS: 271 10*3/uL (ref 150.0–400.0)
RBC: 4.58 Mil/uL (ref 3.87–5.11)
RDW: 13.9 % (ref 11.5–15.5)
WBC: 6 10*3/uL (ref 4.0–10.5)

## 2017-07-10 LAB — IRON,TIBC AND FERRITIN PANEL
%SAT: 16 % (ref 16–45)
FERRITIN: 20 ng/mL (ref 16–154)
IRON: 61 ug/dL (ref 40–190)
TIBC: 370 ug/dL (ref 250–450)

## 2017-07-10 LAB — VITAMIN D 25 HYDROXY (VIT D DEFICIENCY, FRACTURES): VITD: 19.75 ng/mL — ABNORMAL LOW (ref 30.00–100.00)

## 2017-07-10 LAB — TSH: TSH: 1.73 u[IU]/mL (ref 0.35–4.50)

## 2017-07-11 ENCOUNTER — Encounter: Payer: Self-pay | Admitting: Internal Medicine

## 2017-07-12 ENCOUNTER — Other Ambulatory Visit: Payer: Self-pay | Admitting: Internal Medicine

## 2017-07-12 ENCOUNTER — Encounter: Payer: Self-pay | Admitting: Internal Medicine

## 2017-07-12 DIAGNOSIS — L709 Acne, unspecified: Secondary | ICD-10-CM | POA: Insufficient documentation

## 2017-07-12 DIAGNOSIS — E559 Vitamin D deficiency, unspecified: Secondary | ICD-10-CM | POA: Insufficient documentation

## 2017-07-12 MED ORDER — ERGOCALCIFEROL 1.25 MG (50000 UT) PO CAPS: 50000.0000 [IU] | ORAL_CAPSULE | ORAL | 0 refills | Status: DC

## 2017-07-13 NOTE — Telephone Encounter (Signed)
It looks like she has been scheduled with Dr. Roseanne RenoStewart for 8/6. I will add acne to the referral. Thanks!

## 2017-07-18 ENCOUNTER — Telehealth: Payer: No Typology Code available for payment source | Admitting: Nurse Practitioner

## 2017-07-18 DIAGNOSIS — B373 Candidiasis of vulva and vagina: Secondary | ICD-10-CM | POA: Diagnosis not present

## 2017-07-18 DIAGNOSIS — B3731 Acute candidiasis of vulva and vagina: Secondary | ICD-10-CM

## 2017-07-18 MED ORDER — FLUCONAZOLE 150 MG PO TABS: 150.0000 mg | ORAL_TABLET | Freq: Once | ORAL | 0 refills | Status: AC

## 2017-07-18 NOTE — Progress Notes (Signed)

## 2017-07-25 ENCOUNTER — Other Ambulatory Visit: Payer: Self-pay | Admitting: Internal Medicine

## 2017-08-22 ENCOUNTER — Telehealth: Payer: Self-pay | Admitting: Pharmacist

## 2017-08-22 NOTE — Telephone Encounter (Signed)
Called patient to schedule an appointment for the [facility]. I was unable to reach the patient so I left a HIPAA-compliant message requesting that the patient return my call.   

## 2017-08-28 ENCOUNTER — Ambulatory Visit (INDEPENDENT_AMBULATORY_CARE_PROVIDER_SITE_OTHER): Payer: No Typology Code available for payment source | Admitting: Pharmacist

## 2017-08-28 DIAGNOSIS — Z79899 Other long term (current) drug therapy: Secondary | ICD-10-CM

## 2017-08-28 MED ORDER — APREMILAST 30 MG PO TABS: ORAL_TABLET | ORAL | 5 refills | Status: DC

## 2017-08-28 NOTE — Progress Notes (Signed)
   S: [facility] specialty medication therapy.  Patient is currently taking Otezla for psoriasis. Patient is managed by Lori Douglas for this.   Adherence: denies any missed doses  Efficacy: feels like it is working well and that her scalp is the most clear it has been recently.   Dosing:  Active psoriatic arthritis or plaque psoriasis (moderate to severe): Oral: Initial: 10 mg in the morning. Titrate upward by additional 10 mg per day on days 2 to 5 as follows: Day 2: 10 mg twice daily; Day 3: 10 mg in the morning and 20 mg in the evening; Day 4: 20 mg twice daily; Day 5: 20 mg in the morning and 30 mg in the evening. Maintenance dose: 30 mg twice daily starting on day 6  CrCl <30 mL/minute: Initial: 10 mg in the morning on days 1 to 3; titrate using morning doses only (skip evening doses) to 20 mg on days 4 and 5. Maintenance dose: 30 mg once daily in the morning starting on day 6.    Current adverse effects: Headache: had a day where she had a headache but she wasn't sure if it was due to the med or because she has a hx of migraines GI upset: has had nausea and one episode of vomiting but it has improved as time as gone on. Weight loss: denies Neuropsychiatric effects: hx of post-partum depression, no recent changes in mood. Follows closely with PCP.  O:     Lab Results  Component Value Date   WBC 6.0 07/10/2017   HGB 13.6 07/10/2017   HCT 40.6 07/10/2017   MCV 88.6 07/10/2017   PLT 271.0 07/10/2017      Chemistry      Component Value Date/Time   NA 137 07/10/2017 0951   NA 140 06/24/2013 1350   K 4.1 07/10/2017 0951   K 4.1 06/24/2013 1350   CL 101 07/10/2017 0951   CL 107 06/24/2013 1350   CO2 30 07/10/2017 0951   CO2 25 06/24/2013 1350   BUN 11 07/10/2017 0951   BUN 11 06/24/2013 1350   CREATININE 0.73 07/10/2017 0951   CREATININE 0.94 06/24/2013 1350      Component Value Date/Time   CALCIUM 9.0 07/10/2017 0951    CALCIUM 9.0 06/24/2013 1350   ALKPHOS 57 07/10/2017 0951   ALKPHOS 211 (H) 06/24/2013 1350   AST 18 07/10/2017 0951   AST 22 06/24/2013 1350   ALT 20 07/10/2017 0951   ALT 85 (H) 06/24/2013 1350   BILITOT 0.6 07/10/2017 0951   BILITOT 0.9 06/24/2013 1350       A/P: 1. Medication review: patient is currently on Otezla for psoriasis and is having GI upset that it getting better. Psoriasis is under better control. Reviewed the medication including the following: apremilast inhibits phosphodiesterase 4 (PDE4) specific for cyclic adenosine monophosphate (cAMP) which results in increased intracellular cAMP levels and regulation of numerous inflammatory mediators (eg, decreased expression of nitric oxide synthase, TNF-alpha, and interleukin [IL]-23, as well as increased IL-10. Patient educated on purpose, proper use and potential adverse effects of Otezla. Possible adverse effects include weight loss, GI upset, headache, and mood changes. Renal function should be routinely monitored. No recommendations for any changes at this time.   Alvino BloodStacey Karl Deloris Moger, PharmD, BCPS, BCACP, CPP Clinical Pharmacist Practitioner  [phone]

## 2017-09-18 MED FILL — OTEZLA 30 MG TABS: 30 | 30 days supply | Qty: 60 | Fill #0

## 2017-11-24 ENCOUNTER — Telehealth: Payer: No Typology Code available for payment source | Admitting: Family

## 2017-11-24 DIAGNOSIS — J00 Acute nasopharyngitis [common cold]: Secondary | ICD-10-CM | POA: Diagnosis not present

## 2017-11-24 MED ORDER — BENZONATATE 100 MG PO CAPS: 100.0000 mg | ORAL_CAPSULE | Freq: Three times a day (TID) | ORAL | 0 refills | Status: DC | PRN

## 2017-11-24 MED ORDER — FLUTICASONE PROPIONATE 50 MCG/ACT NA SUSP: 2.0000 | Freq: Every day | NASAL | 0 refills | Status: DC

## 2017-11-24 MED FILL — OTEZLA 30 MG TABS: 30 | 30 days supply | Qty: 60 | Fill #1

## 2017-11-24 NOTE — Progress Notes (Signed)
Thank you for the details you included in the comment boxes. Those details are very helpful in determining the best course of treatment for you and help [place] to provide the best care.  We are sorry you are not feeling well.  Here is how we plan to help!  Based on what you have shared with me, it looks like you may have a viral upper respiratory infection or a "common cold".  Colds are caused by a large number of viruses; however, rhinovirus is the most common cause.   Symptoms of the common cold vary from person to person, with common symptoms including sore throat, cough, and malaise.  A low-grade fever of 100.4 may present, but is often uncommon.  Symptoms vary however, and are closely related to a person's age or underlying illnesses.  The most common symptoms associated with the common cold are nasal discharge or congestion, cough, sneezing, headache and pressure in the ears and face.  Cold symptoms usually persist for about 3 to 10 days, but can last up to 2 weeks.  It is important to know that colds do not cause serious illness or complications in most cases.    The common cold is transmitted from person to person, with the most common method of transmission being a person's hands.  The virus is able to live on the skin and can infect other persons for up to 2 hours after direct contact.  Also, colds are transmitted when someone coughs or sneezes; thus, it is important to cover the mouth to reduce this risk.  To keep the spread of the common cold at bay, good hand hygiene is very important.  This is an infection that is most likely caused by a virus. There are no specific treatments for the common cold other than to help you with the symptoms until the infection runs its course.    For nasal congestion, you may use an oral decongestants such as Mucinex D or if you have glaucoma or high blood pressure use plain Mucinex.  Saline nasal spray or nasal drops can help and can safely be used as often as  needed for congestion.  For your congestion, I have prescribed Fluticasone nasal spray one spray in each nostril twice a day  If you do not have a history of heart disease, hypertension, diabetes or thyroid disease, prostate/bladder issues or glaucoma, you may also use Sudafed to treat nasal congestion.  It is highly recommended that you consult with a pharmacist or your primary care physician to ensure this medication is safe for you to take.     If you have a cough, you may use cough suppressants such as Delsym and Robitussin.  If you have glaucoma or high blood pressure, you can also use Coricidin HBP.   For cough I have prescribed for you A prescription cough medication called Tessalon Perles 100 mg. You may take 1-2 capsules every 8 hours as needed for cough  (if you develop a cough)  If you have a sore or scratchy throat, use a saltwater gargle-  to  teaspoon of salt dissolved in a 4-ounce to 8-ounce glass of warm water.  Gargle the solution for approximately 15-30 seconds and then spit.  It is important not to swallow the solution.  You can also use throat lozenges/cough drops and Chloraseptic spray to help with throat pain or discomfort.  Warm or cold liquids can also be helpful in relieving throat pain.  For headache, pain or general discomfort,  you can use Ibuprofen or Tylenol as directed.   Some authorities believe that zinc sprays or the use of Echinacea may shorten the course of your symptoms.   HOME CARE . Only take medications as instructed by your medical team. . Be sure to drink plenty of fluids. Water is fine as well as fruit juices, sodas and electrolyte beverages. You may want to stay away from caffeine or alcohol. If you are nauseated, try taking small sips of liquids. How do you know if you are getting enough fluid? Your urine should be a pale yellow or almost colorless. . Get rest. . Taking a steamy shower or using a humidifier may help nasal congestion and ease sore throat  pain. You can place a towel over your head and breathe in the steam from hot water coming from a faucet. . Using a saline nasal spray works much the same way. . Cough drops, hard candies and sore throat lozenges may ease your cough. . Avoid close contacts especially the very young and the elderly . Cover your mouth if you cough or sneeze . Always remember to wash your hands.   GET HELP RIGHT AWAY IF: . You develop worsening fever. . If your symptoms do not improve within 10 days . You become short of breath. . You develop yellow or green discharge from your nose over 3 days. . You have coughing fits . You develop a severe head ache or visual changes. . You develop shortness of breath or difficulty breathing. . Your symptoms persist after you have completed your treatment plan  MAKE SURE YOU   Understand these instructions.  Will watch your condition.  Will get help right away if you are not doing well or get worse.  Your e-visit answers were reviewed by a board certified advanced clinical practitioner to complete your personal care plan. Depending upon the condition, your plan could have included both over the counter or prescription medications. Please review your pharmacy choice. If there is a problem, you may call our nursing hot line at and have the prescription routed to another pharmacy. Your safety is important to us. If you have drug allergies check your prescription carefully.   You can use MyChart to ask questions about today's visit, request a non-urgent call back, or ask for a work or school excuse for 24 hours related to this e-Visit. If it has been greater than 24 hours you will need to follow up with your provider, or enter a new e-Visit to address those concerns. You will get an e-mail in the next two days asking about your experience.  I hope that your e-visit has been valuable and will speed your recovery. Thank you for using e-visits.

## 2017-12-05 ENCOUNTER — Telehealth: Payer: No Typology Code available for payment source | Admitting: Physician Assistant

## 2017-12-05 DIAGNOSIS — J40 Bronchitis, not specified as acute or chronic: Secondary | ICD-10-CM

## 2017-12-05 MED ORDER — PREDNISONE 20 MG PO TABS: 20.0000 mg | ORAL_TABLET | Freq: Every day | ORAL | 0 refills | Status: AC

## 2017-12-05 MED ORDER — ALBUTEROL SULFATE HFA 108 (90 BASE) MCG/ACT IN AERS: 2.0000 | INHALATION_SPRAY | Freq: Four times a day (QID) | RESPIRATORY_TRACT | 1 refills | Status: DC | PRN

## 2017-12-05 NOTE — Progress Notes (Signed)
We are sorry that you are not feeling well.  Here is how we plan to help!  Based on your presentation I believe you most likely have A cough due to a virus.  This is called viral bronchitis and is best treated by rest, plenty of fluids and control of the cough.  You may use Ibuprofen or Tylenol as directed to help your symptoms.     Given your symptoms are being caused by inflammation due to a virus, I have prescribed prednisone 20 mg daily for five days along with an albuterol inhaler.   From your responses in the eVisit questionnaire you describe inflammation in the upper respiratory tract which is causing a significant cough.  This is commonly called Bronchitis and has four common causes:   Allergies Viral Infections Acid Reflux Bacterial Infection Allergies, viruses and acid reflux are treated by controlling symptoms or eliminating the cause. An example might be a cough caused by taking certain blood pressure medications. You stop the cough by changing the medication. Another example might be a cough caused by acid reflux. Controlling the reflux helps control the cough.  USE OF BRONCHODILATOR ("RESCUE") INHALERS: There is a risk from using your bronchodilator too frequently.  The risk is that over-reliance on a medication which only relaxes the muscles surrounding the breathing tubes can reduce the effectiveness of medications prescribed to reduce swelling and congestion of the tubes themselves.  Although you feel brief relief from the bronchodilator inhaler, your asthma may actually be worsening with the tubes becoming more swollen and filled with mucus.  This can delay other crucial treatments, such as oral steroid medications. If you need to use a bronchodilator inhaler daily, several times per day, you should discuss this with your provider.  There are probably better treatments that could be used to keep your asthma under control.     HOME CARE Only take medications as instructed by your  medical team. Complete the entire course of an antibiotic. Drink plenty of fluids and get plenty of rest. Avoid close contacts especially the very young and the elderly Cover your mouth if you cough or cough into your sleeve. Always remember to wash your hands A steam or ultrasonic humidifier can help congestion.   GET HELP RIGHT AWAY IF: You develop worsening fever. You become short of breath You cough up blood. Your symptoms persist after you have completed your treatment plan MAKE SURE YOU  Understand these instructions. Will watch your condition. Will get help right away if you are not doing well or get worse.  Your e-visit answers were reviewed by a board certified advanced clinical practitioner to complete your personal care plan.  Depending on the condition, your plan could have included both over the counter or prescription medications. If there is a problem please reply once you have received a response from your provider. Your safety is important to us.  If you have drug allergies check your prescription carefully.    You can use MyChart to ask questions about today's visit, request a non-urgent call back, or ask for a work or school excuse for 24 hours related to this e-Visit. If it has been greater than 24 hours you will need to follow up with your provider, or enter a new e-Visit to address those concerns. You will get an e-mail in the next two days asking about your experience.  I hope that your e-visit has been valuable and will speed your recovery. Thank you for using e-visits.   ===  View-only below this line===   ----- Message -----    From: Lori Douglas    Sent: 12/05/2017  8:11 AM EST      To: E-Visit Mailing List Subject: E-Visit Submission: Cough  E-Visit Submission: Cough --------------------------------  Question: How long have you been coughing? Answer:   7 or more days  Question: How would you describe the cough? Answer:   A cough from congested  lungs  Question: How often are you coughing? Answer:   Infrequently but steadily  Question: Does the cough prevent you from sleeping at night? Answer:   No  Question: What other symptoms have you experienced with the cough? Answer:   Runny nose  Question: Do you have a fever? Answer:   No, I do not have a fever  Question: Are you coughing up any mucus? Answer:   I am coughing up a little bit of mucus  Question: Do you use a maintenance inhaler? Answer:   No  Question: Do you use a rescue inhaler (such as Ventolin?) Answer:   No  Question: Have you previously required a prescription for prednisone for cough? Answer:   No  Question: Are you diabetic? Answer:   No  Question: Are you pregnant? Answer:   I am confident that I am not pregnant  Question: Are you breastfeeding? Answer:   No  Question: What is the appearance of the mucus? Answer:   The mucus is thick  Question: Do you have any of the following? Answer:   None of the above  Question: Do you smoke? Answer:   No  Question: Have you ever smoked? Answer:   I have never smoked  Question: Are there people you know with similar symptoms? Answer:   No  Question: Are you experiencing any of the following? Answer:   None of  the above  Question: Are you having difficulty breathing? Answer:   No  Question: Is your coughing worse when you are exposed to pollen, dust, or other things in the environment? Answer:   No  Question: Have you been treated for a similar cough in the past? Answer:   Yes  Question: What treatments have worked in the past? What has not worked? Answer:   Treated via evisit few week ago with tessalon pearls. Cough has persisted since. In past given inhaler but currently have no refills and unable to locate the one previously filled. Mucous is clear and thick. finished decongestant recommended last evisit.  Question: Have you ever been diagnosed with asthma, bronchitis, or lung  disease? Answer:   Yes  Question: Please enter a few details about your earlier diagnosis and treatment Answer:   In past decongestant, nasal sprays, inhalers, cough meds. Have done all of those things except the inhaler this illness.  Question: Have you recently started on any medications for your heart or for blood pressure? Answer:   No  Question: Have you recently been hospitalized? Answer:   No  Question: Please list your medication allergies that you may have ? (If 'none' , please list as 'none') Answer:   Pcn            Sulfa            Biaxcin  Question: Please list any additional comments  Answer:

## 2017-12-26 ENCOUNTER — Other Ambulatory Visit: Payer: Self-pay | Admitting: Internal Medicine

## 2017-12-28 NOTE — Telephone Encounter (Signed)
Alprazolam   Refilled: 06/23/2017 Amitriptyline    Refilled: 04/07/2017 Last OV: 06/23/2017 Next OV: not scheduled

## 2018-01-04 ENCOUNTER — Encounter

## 2018-01-04 ENCOUNTER — Encounter: Payer: Self-pay | Admitting: Family Medicine

## 2018-01-04 ENCOUNTER — Ambulatory Visit (INDEPENDENT_AMBULATORY_CARE_PROVIDER_SITE_OTHER): Payer: No Typology Code available for payment source | Admitting: Family Medicine

## 2018-01-04 VITALS — BP 90/62 | HR 87 | Temp 98.8°F | Ht 63.0 in | Wt 155.0 lb

## 2018-01-04 DIAGNOSIS — T148XXA Other injury of unspecified body region, initial encounter: Secondary | ICD-10-CM | POA: Diagnosis not present

## 2018-01-04 DIAGNOSIS — Z807 Family history of other malignant neoplasms of lymphoid, hematopoietic and related tissues: Secondary | ICD-10-CM | POA: Diagnosis not present

## 2018-01-04 LAB — COMPREHENSIVE METABOLIC PANEL
ALT: 14 U/L (ref 0–35)
AST: 13 U/L (ref 0–37)
Albumin: 4.5 g/dL (ref 3.5–5.2)
Alkaline Phosphatase: 64 U/L (ref 39–117)
BILIRUBIN TOTAL: 0.5 mg/dL (ref 0.2–1.2)
BUN: 10 mg/dL (ref 6–23)
CO2: 25 mEq/L (ref 19–32)
Calcium: 9.3 mg/dL (ref 8.4–10.5)
Chloride: 104 mEq/L (ref 96–112)
Creatinine, Ser: 0.88 mg/dL (ref 0.40–1.20)
GFR: 80.07 mL/min (ref >60.00)
Glucose, Bld: 85 mg/dL (ref 70–99)
Potassium: 3.8 mEq/L (ref 3.5–5.1)
Sodium: 136 mEq/L (ref 135–145)
Total Protein: 7 g/dL (ref 6.0–8.3)

## 2018-01-04 LAB — CBC WITH DIFFERENTIAL/PLATELET
BASOS ABS: 0.1 10*3/uL (ref 0.0–0.1)
Basophils Relative: 1 % (ref 0.0–3.0)
Eosinophils Absolute: 0.1 10*3/uL (ref 0.0–0.7)
Eosinophils Relative: 1.6 % (ref 0.0–5.0)
HCT: 39.8 % (ref 36.0–46.0)
Hemoglobin: 13.1 g/dL (ref 12.0–15.0)
Lymphocytes Relative: 28.7 % (ref 12.0–46.0)
Lymphs Abs: 1.9 10*3/uL (ref 0.7–4.0)
MCHC: 32.9 g/dL (ref 30.0–36.0)
MCV: 87.9 fl (ref 78.0–100.0)
MONOS PCT: 5.3 % (ref 3.0–12.0)
Monocytes Absolute: 0.3 10*3/uL (ref 0.1–1.0)
Neutro Abs: 4.1 10*3/uL (ref 1.4–7.7)
Neutrophils Relative %: 63.4 % (ref 43.0–77.0)
Platelets: 307 10*3/uL (ref 150.0–400.0)
RBC: 4.53 Mil/uL (ref 3.87–5.11)
RDW: 14 % (ref 11.5–15.5)
WBC: 6.5 10*3/uL (ref 4.0–10.5)

## 2018-01-04 LAB — B12 AND FOLATE PANEL
Folate: 8.1 ng/mL (ref >5.9)
VITAMIN B 12: 156 pg/mL — AB (ref 211–911)

## 2018-01-04 LAB — C-REACTIVE PROTEIN: CRP: 0.3 mg/dL — ABNORMAL LOW (ref 0.5–20.0)

## 2018-01-04 LAB — VITAMIN D 25 HYDROXY (VIT D DEFICIENCY, FRACTURES): VITD: 24.47 ng/mL — ABNORMAL LOW (ref 30.00–100.00)

## 2018-01-04 LAB — SEDIMENTATION RATE: Sed Rate: 1 mm/hr (ref 0–20)

## 2018-01-04 NOTE — Progress Notes (Signed)
Subjective:    Patient ID: Lori Douglas, female    DOB: [birth year], 33 y.o.   MRN: [id number]  HPI  Presents to clinic c/o unexplained bruising popping up on legs over the past 2 weeks.  Patient denies any known injury to her legs above because areas of bruising.  Patient is concerned due to family history of Hodgkin's lymphoma in her father and also reports her father did have a folic acid deficiency.  Patient denies any known family history of a blood clotting or bleeding disorder.  Denies any fever or chills.  Denies nausea, vomiting or diarrhea.  Urination and bowel movements normal.  Denies chest pain or shortness of breath.  Patient Active Problem List   Diagnosis Date Noted  . Vitamin D deficiency 07/12/2017  . Acne 07/12/2017  . Encounter for preventive health examination 06/25/2017  . Exposure to influenza 01/28/2016  . Headache, chronic daily 09/18/2015  . Psoriasis 09/18/2015  . 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 Smoking status: Never Smoker  . Smokeless tobacco: Never Used  Substance Use Topics  . Alcohol use: No   Family History  Problem Relation Age of Onset  . Arthritis Mother   . Anxiety disorder Mother   . Arthritis Father   . Hyperlipidemia Father   . Hypertension Father   . Depression Father   . Anxiety disorder Father   . Hodgkin's lymphoma Father   . Heart disease Father   . Endometriosis Sister   . Psoriasis Brother   . Diabetes Paternal Aunt   . Arthritis Maternal Grandmother   . Cancer Maternal Grandmother        ovarian and uterine cancer, breast cancer  . Heart disease Maternal Grandmother   . Stroke Maternal Grandmother   . Hypertension Maternal Grandmother   . Cancer Paternal Grandmother        colon cancer  . Alcohol abuse Paternal Grandfather     Review of Systems  Constitutional: Negative for chills, fatigue and fever.  HENT: Negative for congestion, ear pain, sinus pain and  sore throat.   Eyes: Negative.   Respiratory: Negative for cough, shortness of breath and wheezing.   Cardiovascular: Negative for chest pain, palpitations and leg swelling.  Gastrointestinal: Negative for abdominal pain, diarrhea, nausea and vomiting.  Genitourinary: Negative for dysuria, frequency and urgency.  Musculoskeletal: Negative for arthralgias and myalgias.  Skin: Negative for color change, pallor and rash. Vascular/bleeding: +bruising on legs.   Neurological: Negative for syncope, light-headedness and headaches.  Psychiatric/Behavioral: The patient is not nervous/anxious.      Objective:   Physical Exam Vitals signs and nursing note reviewed.  Constitutional:      Appearance: She is well-developed.  HENT:     Head: Normocephalic and atraumatic.     Right Ear: External ear normal.     Left Ear: External ear normal.     Nose: Nose normal.  Eyes:     General: No scleral icterus.       Right eye: No discharge.        Left eye: No discharge.     Conjunctiva/sclera: Conjunctivae normal.     Pupils: Pupils are equal, round, and reactive to light.  Neck:     Musculoskeletal: Normal range of motion and neck supple.     Trachea: No tracheal deviation.  Cardiovascular:     Rate and Rhythm: Normal rate and regular rhythm.     Heart  sounds: Normal heart sounds. No murmur. No friction rub. No gallop.   Pulmonary:     Effort: Pulmonary effort is normal. No respiratory distress.     Breath sounds: Normal breath sounds. No wheezing or rales.  Abdominal:     General: Bowel sounds are normal. There is no distension.     Palpations: Abdomen is soft.     Tenderness: There is no abdominal tenderness. There is no guarding.  Musculoskeletal: Normal range of motion.        General: No deformity.  Lymphadenopathy:     Cervical: No cervical adenopathy.  Skin:    General: Skin is warm and dry.     Capillary Refill: Capillary refill takes less than 2 seconds.     Coloration: Skin is  not pale.     Findings: Bruising present. No erythema.          Comments: Scattered areas of bruising on bilateral legs indicated by red circles on diagram.  Bruises are in various stages of healing from dark purple to yellowing in color.  Neurological:     Mental Status: She is alert and oriented to person, place, and time.     Gait: Gait normal.  Psychiatric:        Mood and Affect: Mood normal.        Behavior: Behavior normal.        Thought Content: Thought content normal.       Vitals:   01/04/18 1047  BP: 90/62  Pulse: 87  Temp: 98.8 F (37.1 C)  SpO2: 99%   Assessment & Plan:   Unexplained bruising, family history of Hodgkin's lymphoma - due to unexplained bruising we will get lab work today including CBC, CMP, vitamin D, B12/folate, ANA, CRP, sed rate.  Patient would also like a referral to hematology for evaluation due to this unexplained bruising and her family history, states seeing a hematologist will make her feel much better and ease a lot of her fears.  Patient aware she will be contacted in regards to lab work results as well as referral appointment.  Advised she can return to clinic anytime if issues arise.

## 2018-01-05 LAB — THYROID PANEL WITH TSH
Free Thyroxine Index: 2.3 (ref 1.4–3.8)
T3 Uptake: 30 % (ref 22–35)
T4, Total: 7.5 ug/dL (ref 5.1–11.9)
TSH: 3.18 mIU/L

## 2018-01-05 LAB — ANA: Anti Nuclear Antibody(ANA): NEGATIVE

## 2018-01-12 DIAGNOSIS — R238 Other skin changes: Secondary | ICD-10-CM | POA: Insufficient documentation

## 2018-01-12 DIAGNOSIS — R233 Spontaneous ecchymoses: Secondary | ICD-10-CM

## 2018-01-12 HISTORY — DX: Spontaneous ecchymoses: R23.3

## 2018-01-12 HISTORY — DX: Other skin changes: R23.8

## 2018-01-12 NOTE — Progress Notes (Signed)
[facility]  [phone] [phone]  ID: Lori MaineBrandi N Bula OB: 10-27-1985  MR#: [id number]  CSN#:[id number]  Patient Care Team: Sherlene Shamsullo, Teresa L, MD as PCP - General (Internal Medicine)  CHIEF COMPLAINT: Easy bruising  INTERVAL HISTORY: Patient is a 33 year old female who noticed increased bruising over the past 4 to 6 weeks.  She denies any bleeding.  She otherwise feels well.  She has no neurologic complaints.  She denies any recent fevers, night sweats, or unintentional weight loss.  She has no chest pain or shortness of breath.  She denies any nausea, vomiting, constipation, or diarrhea.  She has no urinary complaints.  Patient otherwise feels well and offers no further specific complaints.  REVIEW OF SYSTEMS:   Review of Systems  Constitutional: Negative.  Negative for fever, malaise/fatigue and weight loss.  Respiratory: Negative.  Negative for cough, hemoptysis and shortness of breath.   Cardiovascular: Negative.  Negative for chest pain and leg swelling.  Gastrointestinal: Negative.  Negative for abdominal pain, blood in stool and melena.  Genitourinary: Negative.  Negative for hematuria.  Musculoskeletal: Negative.  Negative for back pain.  Skin: Negative.  Negative for rash.  Neurological: Negative for dizziness, focal weakness, weakness and headaches.  Endo/Heme/Allergies: Bruises/bleeds easily.  Psychiatric/Behavioral: Negative.  The patient is not nervous/anxious.     As per HPI. Otherwise, a complete review of systems is negative.  PAST MEDICAL HISTORY: Past Medical History:  Diagnosis Date  . Frequent headaches   . Heart murmur   . Migraine   . Preeclampsia   . Psoriasis     PAST SURGICAL HISTORY: Past Surgical History:  Procedure Laterality Date  . CESAREAN SECTION  2015  . CHOLECYSTECTOMY  2015    FAMILY HISTORY: Family History  Problem Relation Age of Onset  . Arthritis Mother   . Anxiety disorder Mother   .  Arthritis Father   . Hyperlipidemia Father   . Hypertension Father   . Depression Father   . Anxiety disorder Father   . Hodgkin's lymphoma Father   . Heart disease Father   . Endometriosis Sister   . Psoriasis Brother   . Diabetes Paternal Aunt   . Arthritis Maternal Grandmother   . Cancer Maternal Grandmother        ovarian and uterine cancer, breast cancer  . Heart disease Maternal Grandmother   . Stroke Maternal Grandmother   . Hypertension Maternal Grandmother   . Cancer Paternal Grandmother        colon cancer  . Alcohol abuse Paternal Grandfather     ADVANCED DIRECTIVES (Y/N):  N  HEALTH MAINTENANCE: Social History   Tobacco Use  . Smoking status: Never Smoker  . Smokeless tobacco: Never Used  Substance Use Topics  . Alcohol use: No  . Drug use: No     Colonoscopy:  PAP:  Bone density:  Lipid panel:  Allergies  Allergen Reactions  . Biaxin [Clarithromycin] Other (See Comments)    Other reaction(s): Hallucination hallucinations  . Sulfa Antibiotics Other (See Comments)  . Penicillins Rash    Current Outpatient Medications  Medication Sig Dispense Refill  . ALPRAZolam (XANAX) 0.25 MG tablet TAKE 1 TABLET BY MOUTH EACH NIGHT AT BEDTIME AS NEEDED FOR SLEEP 30 tablet 5  . amitriptyline (ELAVIL) 50 MG tablet TAKE 1 TABLET BY MOUTH AT BEDTIME. 90 tablet 1  . Apremilast (OTEZLA) 30 MG TABS Take 1 tablet by mouth twice daily as directed 60 tablet 5  . butalbital-acetaminophen-caffeine (FIORICET, ESGIC)  50-325-40 MG tablet TAKE 1 TO 2 TABLETS BY MOUTH EVERY 6 HOURS AS NEEDED FOR HEADACHE. MAX OF 4 TABLETS DAILY. 60 tablet 5  . cholecalciferol (VITAMIN D3) 25 MCG (1000 UT) tablet Take 4,000 Units by mouth daily.    . fluticasone (FLONASE) 50 MCG/ACT nasal spray Place 2 sprays into both nostrils daily. 16 g 0  . metoprolol tartrate (LOPRESSOR) 25 MG tablet Take 25 mg by mouth 1 day or 1 dose.    . albuterol (PROVENTIL HFA) 108 (90 Base) MCG/ACT inhaler Inhale 2  puffs into the lungs every 6 (six) hours as needed for wheezing or shortness of breath. (Patient not taking: Reported on 01/15/2018) 1 Inhaler 1   No current facility-administered medications for this visit.     OBJECTIVE: Vitals:   01/15/18 1454  BP: 116/81  Pulse: (!) 101  Temp: 97.7 F (36.5 C)     Body mass index is 27.81 kg/m.    ECOG FS:0 - Asymptomatic  General: Well-developed, well-nourished, no acute distress. Eyes: Pink conjunctiva, anicteric sclera. HEENT: Normocephalic, moist mucous membranes, clear oropharnyx. Lungs: Clear to auscultation bilaterally. Heart: Regular rate and rhythm. No rubs, murmurs, or gallops. Abdomen: Soft, nontender, nondistended. No organomegaly noted, normoactive bowel sounds. Musculoskeletal: No edema, cyanosis, or clubbing. Neuro: Alert, answering all questions appropriately. Cranial nerves grossly intact. Skin: No rashes or petechiae noted.  Mild ecchymosis noted on lower extremities. Psych: Normal affect. Lymphatics: No cervical, calvicular, axillary or inguinal LAD.   LAB RESULTS:  Lab Results  Component Value Date   NA 136 01/04/2018   K 3.8 01/04/2018   CL 104 01/04/2018   CO2 25 01/04/2018   GLUCOSE 85 01/04/2018   BUN 10 01/04/2018   CREATININE 0.88 01/04/2018   CALCIUM 9.3 01/04/2018   PROT 7.0 01/04/2018   ALBUMIN 4.5 01/04/2018   AST 13 01/04/2018   ALT 14 01/04/2018   ALKPHOS 64 01/04/2018   BILITOT 0.5 01/04/2018   GFRNONAA >60 06/24/2013   GFRAA >60 06/24/2013    Lab Results  Component Value Date   WBC 5.3 01/15/2018   NEUTROABS 3.2 01/15/2018   HGB 11.8 (L) 01/15/2018   HCT 38.2 01/15/2018   MCV 90.1 01/15/2018   PLT 308 01/15/2018     STUDIES: No results found.  ASSESSMENT: Easy bruising  PLAN:   1. Easy bruising: Unclear etiology.  PT, PTT, platelet count, and platelet function assay are all within normal limits.  Von Willebrand panel and peripheral blood flow cytometry are pending at time of  dictation.  No intervention is needed at this time.,  Return to clinic in 3 months for further evaluation.  If patient's symptoms resolve, this appointment could possibly be canceled. 2.  Iron deficiency: Patient noted to have significantly reduced iron stores, but her hemoglobin is only mildly decreased at 11.8.  I recommended oral iron supplementation.  I spent a total of 45 minutes face-to-face with the patient of which greater than 50% of the visit was spent in counseling and coordination of care as detailed above.   Patient expressed understanding and was in agreement with this plan. She also understands that She can call clinic at any time with any questions, concerns, or complaints.    Jeralyn Ruths, MD   01/16/2018 6:49 AM

## 2018-01-15 ENCOUNTER — Inpatient Hospital Stay: Payer: No Typology Code available for payment source

## 2018-01-15 ENCOUNTER — Other Ambulatory Visit: Payer: Self-pay

## 2018-01-15 ENCOUNTER — Inpatient Hospital Stay: Payer: No Typology Code available for payment source | Attending: Oncology | Admitting: Oncology

## 2018-01-15 DIAGNOSIS — F329 Major depressive disorder, single episode, unspecified: Secondary | ICD-10-CM | POA: Diagnosis not present

## 2018-01-15 DIAGNOSIS — Z79899 Other long term (current) drug therapy: Secondary | ICD-10-CM | POA: Insufficient documentation

## 2018-01-15 DIAGNOSIS — I10 Essential (primary) hypertension: Secondary | ICD-10-CM | POA: Insufficient documentation

## 2018-01-15 DIAGNOSIS — Z800 Family history of malignant neoplasm of digestive organs: Secondary | ICD-10-CM | POA: Insufficient documentation

## 2018-01-15 DIAGNOSIS — Z8542 Personal history of malignant neoplasm of other parts of uterus: Secondary | ICD-10-CM | POA: Diagnosis not present

## 2018-01-15 DIAGNOSIS — R233 Spontaneous ecchymoses: Secondary | ICD-10-CM

## 2018-01-15 DIAGNOSIS — Z853 Personal history of malignant neoplasm of breast: Secondary | ICD-10-CM | POA: Insufficient documentation

## 2018-01-15 DIAGNOSIS — R011 Cardiac murmur, unspecified: Secondary | ICD-10-CM | POA: Insufficient documentation

## 2018-01-15 DIAGNOSIS — R51 Headache: Secondary | ICD-10-CM | POA: Insufficient documentation

## 2018-01-15 DIAGNOSIS — Z803 Family history of malignant neoplasm of breast: Secondary | ICD-10-CM | POA: Insufficient documentation

## 2018-01-15 DIAGNOSIS — R238 Other skin changes: Secondary | ICD-10-CM | POA: Diagnosis present

## 2018-01-15 DIAGNOSIS — K8020 Calculus of gallbladder without cholecystitis without obstruction: Secondary | ICD-10-CM | POA: Insufficient documentation

## 2018-01-15 DIAGNOSIS — E785 Hyperlipidemia, unspecified: Secondary | ICD-10-CM | POA: Diagnosis not present

## 2018-01-15 LAB — CBC WITH DIFFERENTIAL/PLATELET
Abs Immature Granulocytes: 0.02 10*3/uL (ref 0.00–0.07)
BASOS ABS: 0.1 10*3/uL (ref 0.0–0.1)
Basophils Relative: 1 %
Eosinophils Absolute: 0.1 10*3/uL (ref 0.0–0.5)
Eosinophils Relative: 1 %
HCT: 38.2 % (ref 36.0–46.0)
Hemoglobin: 11.8 g/dL — ABNORMAL LOW (ref 12.0–15.0)
Immature Granulocytes: 0 %
Lymphocytes Relative: 32 %
Lymphs Abs: 1.7 10*3/uL (ref 0.7–4.0)
MCH: 27.8 pg (ref 26.0–34.0)
MCHC: 30.9 g/dL (ref 30.0–36.0)
MCV: 90.1 fL (ref 80.0–100.0)
Monocytes Absolute: 0.2 10*3/uL (ref 0.1–1.0)
Monocytes Relative: 4 %
NEUTROS ABS: 3.2 10*3/uL (ref 1.7–7.7)
Neutrophils Relative %: 62 %
Platelets: 308 10*3/uL (ref 150–400)
RBC: 4.24 MIL/uL (ref 3.87–5.11)
RDW: 12.4 % (ref 11.5–15.5)
WBC: 5.3 10*3/uL (ref 4.0–10.5)
nRBC: 0 % (ref 0.0–0.2)

## 2018-01-15 LAB — PLATELET FUNCTION ASSAY: Collagen / Epinephrine: 100 seconds (ref 0–193)

## 2018-01-15 LAB — IRON AND TIBC
Iron: 25 ug/dL — ABNORMAL LOW (ref 28–170)
Saturation Ratios: 5 % — ABNORMAL LOW (ref 10.4–31.8)
TIBC: 477 ug/dL — ABNORMAL HIGH (ref 250–450)
UIBC: 452 ug/dL

## 2018-01-15 LAB — PROTIME-INR
INR: 0.85
Prothrombin Time: 11.6 seconds (ref 11.4–15.2)

## 2018-01-15 LAB — FERRITIN: Ferritin: 8 ng/mL — ABNORMAL LOW (ref 11–307)

## 2018-01-15 LAB — APTT: aPTT: 26 seconds (ref 24–36)

## 2018-01-15 NOTE — Progress Notes (Signed)
Patient is here today to establish care. Patient stated that she started to have unexplainable bruising for the past month. Patient's primary care wanted her to come in.

## 2018-01-16 ENCOUNTER — Telehealth: Payer: Self-pay | Admitting: *Deleted

## 2018-01-16 NOTE — Telephone Encounter (Signed)
I spoke with patient and advised her of need for over the counter oral ironuntil she is seen again. ADVISE TO TAKE AFTER EATING TO AVOID STOMACH UPSET AND THAT IT CAN TURN  HER STOOLS BLACK

## 2018-01-16 NOTE — Telephone Encounter (Signed)
Left vm regarding lab results, recommendations from Dr. Orlie Dakin. Lab results from yesterday show low ferritin and iron levels, recommended that patient take OTC iron daily until next scheduled follow up appointment. Labs to be rechecked at that time.

## 2018-01-17 LAB — VON WILLEBRAND PANEL
Coagulation Factor VIII: 189 % — ABNORMAL HIGH (ref 56–140)
Ristocetin Co-factor, Plasma: 143 % (ref 50–200)
Von Willebrand Antigen, Plasma: 143 % (ref 50–200)

## 2018-01-17 LAB — COMP PANEL: LEUKEMIA/LYMPHOMA

## 2018-01-17 LAB — COAG STUDIES INTERP REPORT

## 2018-01-31 ENCOUNTER — Telehealth: Payer: No Typology Code available for payment source | Admitting: Family

## 2018-01-31 DIAGNOSIS — R399 Unspecified symptoms and signs involving the genitourinary system: Secondary | ICD-10-CM

## 2018-01-31 DIAGNOSIS — R109 Unspecified abdominal pain: Secondary | ICD-10-CM

## 2018-01-31 NOTE — Progress Notes (Signed)
Based on what you shared with me it looks like you have a serious condition that should be evaluated in a face to face office visit.  NOTE: If you entered your credit card information for this eVisit, you will not be charged. You may see a "hold" on your card for the $30 but that hold will drop off and you will not have a charge processed.    *Since you are having back pain with these symptoms, I believe it would be best if you were seen face to face to be evaluated. You could have a more serious infection and would be best if we could do a urine culture.    If you are having a true medical emergency please call 911.  If you need an urgent face to face visit, [facility] has four urgent care centers for your convenience.  If you need care fast and have a high deductible or no insurance consider:   [email or web address] to reserve your spot online an avoid wait times  [facility] [address], [address] [place], [place] [address] 8 am to 8 pm Monday-Friday 10 am to 4 pm Saturday-Sunday *Across the street from [employer]  [address] [place], [address] 8 am to 5 pm Monday-Friday * In the [facility] on the [facility]   The following sites will take your  insurance:  . [facility]  [phone] Get Driving Directions Find a Provider at this Location  [address] [place], [place] [address] . 10 am to 8 pm Monday-Friday . 12 pm to 8 pm Saturday-Sunday   . [facility] at [facility]  [phone] Get Driving Directions Find a Provider at this Location  [address] [place], [place] [address] . 8 am to 8 pm Monday-Friday . 9 am to 6 pm Saturday . 11 am to 6 pm Sunday   . [facility] at [facility]  [phone] Get Driving Directions  [address] [place], [place] [address] . 8 am to 8 pm Monday-Friday . 8 am to 4 pm  Saturday-Sunday   Your e-visit answers were reviewed by a board certified advanced clinical practitioner to complete your personal care plan.  Thank you for using e-Visits.

## 2018-02-07 ENCOUNTER — Ambulatory Visit (INDEPENDENT_AMBULATORY_CARE_PROVIDER_SITE_OTHER): Payer: Self-pay | Admitting: Physician Assistant

## 2018-02-07 ENCOUNTER — Encounter: Payer: Self-pay | Admitting: Physician Assistant

## 2018-02-07 VITALS — BP 122/88 | HR 85 | Temp 98.2°F | Resp 16 | Ht 63.0 in | Wt 156.0 lb

## 2018-02-07 DIAGNOSIS — J069 Acute upper respiratory infection, unspecified: Secondary | ICD-10-CM

## 2018-02-07 DIAGNOSIS — R509 Fever, unspecified: Secondary | ICD-10-CM

## 2018-02-07 LAB — POCT INFLUENZA A/B
Influenza A, POC: NEGATIVE
Influenza B, POC: NEGATIVE

## 2018-02-07 MED ORDER — OXYMETAZOLINE HCL 0.05 % NA SOLN: 1.0000 | Freq: Two times a day (BID) | NASAL | 0 refills | Status: AC

## 2018-02-07 MED ORDER — OSELTAMIVIR PHOSPHATE 75 MG PO CAPS: 75.0000 mg | ORAL_CAPSULE | Freq: Two times a day (BID) | ORAL | 0 refills | Status: AC

## 2018-02-07 MED ORDER — PSEUDOEPH-BROMPHEN-DM 30-2-10 MG/5ML PO SYRP: 5.0000 mL | ORAL_SOLUTION | Freq: Four times a day (QID) | ORAL | 0 refills | Status: DC | PRN

## 2018-02-07 NOTE — Patient Instructions (Signed)
Thank you for choosing [facility] for your health care needs.  You have been diagnosed with an upper respiratory infection (a cold).  Your rapid flu test was NEGATIVE.  The rapid flu test is not a perfect test, you can have the flu and get a false negative test.  We will prescribe you the antiviral; should decrease the duration of illness and decrease viral shedding (making you slightly less contagious).  Recommend increase fluids; water, Gatorade, hot tea with lemon/honey, juice. Rest. Take Tylenol or ibuprofen for fever. Continue to use Flonase nasal spray.  You have been prescribed Afrin nasal spray (may only use for 3 days). You have been prescribed a cough syrup.  Recommend you have your son evaluated by his pediatrician or his immunologist.  Follow-up with your family physician or the urgent care in a few days if symptoms not improving. Sooner with any worsening symptoms.  Upper Respiratory Infection, Adult An upper respiratory infection (URI) affects the nose, throat, and upper air passages. URIs are caused by germs (viruses). The most common type of URI is often called "the common cold." Medicines cannot cure URIs, but you can do things at home to relieve your symptoms. URIs usually get better within 7-10 days. Follow these instructions at home: Activity  Rest as needed.  If you have a fever, stay home from work or school until your fever is gone, or until your doctor says you may return to work or school. ? You should stay home until you cannot spread the infection anymore (you are not contagious). ? Your doctor may have you wear a face mask so you have less risk of spreading the infection. Relieving symptoms  Gargle with a salt-water mixture 3-4 times a day or as needed. To make a salt-water mixture, completely dissolve -1 tsp of salt in 1 cup of warm water.  Use a cool-mist humidifier to add moisture to the air. This can help you breathe more easily. Eating and  drinking   Drink enough fluid to keep your pee (urine) pale yellow.  Eat soups and other clear broths. General instructions   Take over-the-counter and prescription medicines only as told by your doctor. These include cold medicines, fever reducers, and cough suppressants.  Do not use any products that contain nicotine or tobacco. These include cigarettes and e-cigarettes. If you need help quitting, ask your doctor.  Avoid being where people are smoking (avoid secondhand smoke).  Make sure you get regular shots and get the flu shot every year.  Keep all follow-up visits as told by your doctor. This is important. How to avoid spreading infection to others   Wash your hands often with soap and water. If you do not have soap and water, use hand sanitizer.  Avoid touching your mouth, face, eyes, or nose.  Cough or sneeze into a tissue or your sleeve or elbow. Do not cough or sneeze into your hand or into the air. Contact a doctor if:  You are getting worse, not better.  You have any of these: ? A fever. ? Chills. ? Brown or red mucus in your nose. ? Yellow or brown fluid (discharge)coming from your nose. ? Pain in your face, especially when you bend forward. ? Swollen neck glands. ? Pain with swallowing. ? White areas in the back of your throat. Get help right away if:  You have shortness of breath that gets worse.  You have very bad or constant: ? Headache. ? Ear pain. ? Pain in your  forehead, behind your eyes, and over your cheekbones (sinus pain). ? Chest pain.  You have long-lasting (chronic) lung disease along with any of these: ? Wheezing. ? Long-lasting cough. ? Coughing up blood. ? A change in your usual mucus.  You have a stiff neck.  You have changes in your: ? Vision. ? Hearing. ? Thinking. ? Mood. Summary  An upper respiratory infection (URI) is caused by a germ called a virus. The most common type of URI is often called "the common  cold."  URIs usually get better within 7-10 days.  Take over-the-counter and prescription medicines only as told by your doctor. This information is not intended to replace advice given to you by your health care provider. Make sure you discuss any questions you have with your health care provider. Document Released: 06/08/2007 Document Revised: 08/12/2016 Document Reviewed: 08/12/2016 Elsevier Interactive Patient Education  2019 [employer].

## 2018-02-07 NOTE — Progress Notes (Signed)
Patient ID: Lori Douglas DOB: 10-05-1985 AGE: 32 y.o. MRN: [id number]   PCP: Sherlene Shamsullo, Teresa L, MD   Chief Complaint:  Chief Complaint  Patient presents with  . Fever    x2d  . Chills    x2d  . PND    x2d     Subjective:    HPI:  Lori Douglas is a 33 y.o. female presents for evaluation  Chief Complaint  Patient presents with  . Fever    x2d  . Chills    x2d  . PND    x442d    33 year old female [facility] with three day history of flu-like symptoms. Patient states four days ago, Saturday 02/03/2018, developed migraine. Patient with history of migraines. Felt like typical migraine. Took prescribed Fioricet, provided mild relief. Headache gradually improved over course of the following day, Sunday. On Monday 02/05/2018, patient with nasal congestion and bilateral maxillary sinus pressure. Yesterday, Tuesday 02/06/2018, developed rhinorrhea, sneezing, postnasal drip, and cough. Cough dry. Yesterday evening developed fever. Persisted throughout the night, even with ibuprofen. Tmax 101F. Associated chills and body aches. Has used Flonase nasal spray and NettiPot (clear discharge) with minimal improvement. Denies dizziness/lightheadedness, ear pain, neck pain, sore throat, chest pain, SOB (worse than per her normal), wheezing, nausea/vomiting, diarrhea, abdominal pain, rash. Patient did receive this season's influenza vaccination. Patient is a cigarette smoker, also vapes. Patient denies history of asthma. Frequently prescribed albuterol inhaler when she has bronchitis.  Patient current being treated for several months of shortness of breath. Attributed to anemia. Patient on iron supplement, B12 supplement, and VitD supplement.  Patient performed E-Visit on 11/24/2017 with 3 day history of URI symptoms (nasal congestion, sinus pressure/congestion, headache, ear pain, and cough). Diagnosed with viral URI. Performed another E-Visit on 12/05/2017 with complaint  of 7 or more days of cough. Diagnosed with viral bronchitis. Prescribed prednisone 20mg  qd x 5 days and an albuterol inhaler.  Patient's son immunocompromised, has an IgA deficiency. Managed by Dr. Vergia Albertsobert Van Winkle, allergist/immunologist in [place], [place]. Son has recurrent sinusitis and otitis media.   A limited review of symptoms was performed, pertinent positives and negatives as mentioned in HPI.  The following portions of the patient's history were reviewed and updated as appropriate: allergies, current medications and past medical history.  Patient Active Problem List   Diagnosis Date Noted  . Gallstones 01/15/2018  . Easy bruising 01/12/2018  . Vitamin D deficiency 07/12/2017  . Acne 07/12/2017  . Encounter for preventive health examination 06/25/2017  . Exposure to influenza 01/28/2016  . Headache, chronic daily 09/18/2015  . Psoriasis 09/18/2015  . 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 Hormone deficiency 03/12/2013    Allergies  Allergen Reactions  . Biaxin [Clarithromycin] Other (See Comments)    Other reaction(s): Hallucination hallucinations  . Sulfa Antibiotics Other (See Comments)  . Penicillins Rash    Current Outpatient Medications on File Prior to Visit  Medication Sig Dispense Refill  . Adapalene 0.3 % gel   2  . albuterol (PROVENTIL HFA) 108 (90 Base) MCG/ACT inhaler Inhale 2 puffs into the lungs every 6 (six) hours as needed for wheezing or shortness of breath. 1 Inhaler 1  . ALPRAZolam (XANAX) 0.25 MG tablet TAKE 1 TABLET BY MOUTH EACH NIGHT AT BEDTIME AS NEEDED FOR SLEEP 30 tablet 5  . amitriptyline (ELAVIL) 50 MG tablet  TAKE 1 TABLET BY MOUTH AT BEDTIME. 90 tablet 1  . Apremilast (OTEZLA) 30 MG TABS Take 1 tablet by mouth twice daily as directed 60 tablet 5  . butalbital-acetaminophen-caffeine (FIORICET, ESGIC) 50-325-40  MG tablet TAKE 1 TO 2 TABLETS BY MOUTH EVERY 6 HOURS AS NEEDED FOR HEADACHE. MAX OF 4 TABLETS DAILY. 60 tablet 5  . cholecalciferol (VITAMIN D3) 25 MCG (1000 UT) tablet Take 4,000 Units by mouth daily.    . clobetasol (TEMOVATE) 0.05 % external solution   3  . doxycycline (DORYX) 100 MG EC tablet   3  . fluticasone (FLONASE) 50 MCG/ACT nasal spray Place 2 sprays into both nostrils daily. 16 g 0  . metoprolol tartrate (LOPRESSOR) 25 MG tablet Take 25 mg by mouth 1 day or 1 dose.    . pimecrolimus (ELIDEL) 1 % cream   3   No current facility-administered medications on file prior to visit.        Objective:   Vitals:   02/07/18 0827  BP: 122/88  Pulse: 85  Resp: 16  Temp: 98.2 F (36.8 C)  SpO2: 100%     Wt Readings from Last 3 Encounters:  02/07/18 156 lb (70.8 kg)  01/15/18 157 lb (71.2 kg)  01/04/18 155 lb (70.3 kg)    Physical Exam:   General Appearance:  Patient sitting comfortably on examination table. Conversational. Peri Jefferson self-historian. In no acute distress. Afebrile.   Head:  Normocephalic, without obvious abnormality, atraumatic  Eyes:  PERRL, conjunctiva/corneas clear, EOM's intact  Ears:  Bilateral ear canals WNL. No erythema or edema. No discharge/drainage. Bilateral TMs WNL. No erythema, injection, or serous effusion. No scar tissue.  Nose: Nares normal, septum midline. Nasal mucosa with bilateral edema. Scant clear rhinorrhea. Nasally sounding voice. No sinus tenderness with percussion/palpation.  Throat: Lips, mucosa, and tongue normal; teeth and gums normal. Throat reveals no erythema. Tonsils with no enlargement or exudate.  Neck: Supple, symmetrical, trachea midline, no adenopathy  Lungs:   Clear to auscultation bilaterally, respirations unlabored. Good aeration. No wheezing, rales, rhonchi, or crackles.  Heart:  Regular rate and rhythm, S1 and S2 normal, no murmur, rub, or gallop  Extremities: Extremities normal, atraumatic, no cyanosis or edema  Pulses:  2+ and symmetric  Skin: Skin color, texture, turgor normal, no rashes or lesions  Lymph nodes: Cervical, supraclavicular, and axillary nodes normal  Neurologic: Normal    Assessment & Plan:    Exam findings, diagnosis etiology and medication use and indications reviewed with patient. Follow-Up and discharge instructions provided. No emergent/urgent issues found on exam.  Patient education was provided.   Patient verbalized understanding of information provided and agrees with plan of care (POC), all questions answered. The patient is advised to call or return to clinic if condition does not see an improvement in symptoms, or to seek the care of the closest emergency department if condition worsens with the below plan.    1. Upper respiratory tract infection, unspecified type - brompheniramine-pseudoephedrine-DM 30-2-10 MG/5ML syrup; Take 5 mLs by mouth 4 (four) times daily as needed.  Dispense: 120 mL; Refill: 0 - oxymetazoline (AFRIN NASAL SPRAY) 0.05 % nasal spray; Place 1 spray into both nostrils 2 (two) times daily for 3 days.  Dispense: 14.7 mL; Refill: 0  2. Fever, unspecified fever cause - POCT Influenza A/B - oseltamivir (TAMIFLU) 75 MG capsule; Take 1 capsule (75 mg total) by mouth 2 (two) times daily for 5 days.  Dispense: 10 capsule; Refill: 0  Patient with  three day history of URI symptoms. Precipitated by migraine two days prior. Yesterday evening developed fever, chills, body aches. Concern for influenza. Rapid flu test negative. Discussed with patient possibility of false negative, agreed to treat patient empirically, prescribed Tamiflu. Patient's VSS, afebrile, in no acute distress, clear lung sounds - minimal concern for flu complications at this time.   Patient with immunocompromised son at home; advised patient have son evaluated by pediatrician or immunologist since she states he developed a fever today. Prescribed Afrin nasal spray and Bromphed cough syrup for symptom  relief. Advised increase fluids and rest. Gave patient work excuse note. Advised patient follow-up with PCP or urgent care in a few days if not improving, sooner with any worsening symptoms.   Janalyn HarderSamantha Johnell Landowski, MHS, PA-C Rulon SeraSamantha F. Hoa Deriso, MHS, PA-C Advanced Practice Provider [facility]  [facility]  [address], [facility], 1st Floor [place], [place] [id number] (p):  [phone] [email or web address]

## 2018-02-09 ENCOUNTER — Telehealth: Payer: Self-pay | Admitting: Emergency Medicine

## 2018-02-09 NOTE — Telephone Encounter (Signed)
Left message following up on visit with [facility] 

## 2018-02-20 ENCOUNTER — Ambulatory Visit (INDEPENDENT_AMBULATORY_CARE_PROVIDER_SITE_OTHER): Payer: Self-pay | Admitting: Physician Assistant

## 2018-02-20 ENCOUNTER — Encounter: Payer: Self-pay | Admitting: Physician Assistant

## 2018-02-20 VITALS — BP 120/86 | HR 82 | Temp 98.2°F | Resp 16 | Ht 62.0 in | Wt 154.0 lb

## 2018-02-20 DIAGNOSIS — H6981 Other specified disorders of Eustachian tube, right ear: Secondary | ICD-10-CM

## 2018-02-20 MED ORDER — LORATADINE-PSEUDOEPHEDRINE ER 10-240 MG PO TB24: 1.0000 | ORAL_TABLET | Freq: Every day | ORAL | 0 refills | Status: AC

## 2018-02-20 MED ORDER — PREDNISONE 50 MG PO TABS: 50.0000 mg | ORAL_TABLET | Freq: Every day | ORAL | 0 refills | Status: AC

## 2018-02-20 NOTE — Patient Instructions (Addendum)
Thank you for choosing [facility] for your health care needs.  You have been diagnosed with right eustachian tube dysfunction.  Recommend you continue to use Flonase nasal spray. You have been prescribed Claritin-D 24hr. Take 1 tab once a day.  If in a few days your symptoms have not resolved, start prednisone course. 1 tab by mouth once a day.  If in a few days your symptoms are still not improving, recommend you follow-up with ENT (ear nose and throat) specialist for further evaluation and treatment.  You may at any time return to [facility] for re-evaluation of ear. If you develop fever, chills, headache, dizziness/lightheadedness, ear discharge/drainage, significant worsening of ear pain, or other new/concerning symptom - recommend you follow-up with family physician or urgent care.  Eustachian Tube Dysfunction  Eustachian tube dysfunction refers to a condition in which a blockage develops in the narrow passage that connects the middle ear to the back of the nose (eustachian tube). The eustachian tube regulates air pressure in the middle ear by letting air move between the ear and nose. It also helps to drain fluid from the middle ear space. Eustachian tube dysfunction can affect one or both ears. When the eustachian tube does not function properly, air pressure, fluid, or both can build up in the middle ear. What are the causes? This condition occurs when the eustachian tube becomes blocked or cannot open normally. Common causes of this condition include:  Ear infections.  Colds and other infections that affect the nose, mouth, and throat (upper respiratory tract).  Allergies.  Irritation from cigarette smoke.  Irritation from stomach acid coming up into the esophagus (gastroesophageal reflux). The esophagus is the tube that carries food from the mouth to the stomach.  Sudden changes in air pressure, such as from descending in an airplane or scuba diving.  Abnormal growths in the  nose or throat, such as: ? Growths that line the nose (nasal polyps). ? Abnormal growth of cells (tumors). ? Enlarged tissue at the back of the throat (adenoids). What increases the risk? You are more likely to develop this condition if:  You smoke.  You are overweight.  You are a child who has: ? Certain birth defects of the mouth, such as cleft palate. ? Large tonsils or adenoids. What are the signs or symptoms? Common symptoms of this condition include:  A feeling of fullness in the ear.  Ear pain.  Clicking or popping noises in the ear.  Ringing in the ear.  Hearing loss.  Loss of balance.  Dizziness. Symptoms may get worse when the air pressure around you changes, such as when you travel to an area of high elevation, fly on an airplane, or go scuba diving. How is this diagnosed? This condition may be diagnosed based on:  Your symptoms.  A physical exam of your ears, nose, and throat.  Tests, such as those that measure: ? The movement of your eardrum (tympanogram). ? Your hearing (audiometry). How is this treated? Treatment depends on the cause and severity of your condition.  In mild cases, you may relieve your symptoms by moving air into your ears. This is called "popping the ears."  In more severe cases, or if you have symptoms of fluid in your ears, treatment may include: ? Medicines to relieve congestion (decongestants). ? Medicines that treat allergies (antihistamines). ? Nasal sprays or ear drops that contain medicines that reduce swelling (steroids). ? A procedure to drain the fluid in your eardrum (myringotomy). In this procedure,  a small tube is placed in the eardrum to:  Drain the fluid.  Restore the air in the middle ear space. ? A procedure to insert a balloon device through the nose to inflate the opening of the eustachian tube (balloon dilation). Follow these instructions at home: Lifestyle  Do not do any of the following until your health  care provider approves: ? Travel to high altitudes. ? Fly in airplanes. ? Work in a Estate agent or room. ? Scuba dive.  Do not use any products that contain nicotine or tobacco, such as cigarettes and e-cigarettes. If you need help quitting, ask your health care provider.  Keep your ears dry. Wear fitted earplugs during showering and bathing. Dry your ears completely after. General instructions  Take over-the-counter and prescription medicines only as told by your health care provider.  Use techniques to help pop your ears as recommended by your health care provider. These may include: ? Chewing gum. ? Yawning. ? Frequent, forceful swallowing. ? Closing your mouth, holding your nose closed, and gently blowing as if you are trying to blow air out of your nose.  Keep all follow-up visits as told by your health care provider. This is important. Contact a health care provider if:  Your symptoms do not go away after treatment.  Your symptoms come back after treatment.  You are unable to pop your ears.  You have: ? A fever. ? Pain in your ear. ? Pain in your head or neck. ? Fluid draining from your ear.  Your hearing suddenly changes.  You become very dizzy.  You lose your balance. Summary  Eustachian tube dysfunction refers to a condition in which a blockage develops in the eustachian tube.  It can be caused by ear infections, allergies, inhaled irritants, or abnormal growths in the nose or throat.  Symptoms include ear pain, hearing loss, or ringing in the ears.  Mild cases are treated with maneuvers to unblock the ears, such as yawning or ear popping.  Severe cases are treated with medicines. Surgery may also be done (rare). This information is not intended to replace advice given to you by your health care provider. Make sure you discuss any questions you have with your health care provider. Document Released: 01/16/2015 Document Revised: 04/11/2017 Document  Reviewed: 04/11/2017 Elsevier Interactive Patient Education  2019 [employer].

## 2018-02-20 NOTE — Progress Notes (Signed)
Patient ID: Lori Douglas DOB: 11/24/1985 AGE: 33 y.o. MRN: [id number]   PCP: Sherlene Shams, MD   Chief Complaint:  Chief Complaint  Patient presents with  . Otitis Media    2-3d     Subjective:    HPI:  Lori Douglas is a 33 y.o. female presents for evaluation  Chief Complaint  Patient presents with  . Otitis Media    82-2d   33 year old female presents to [facility] with 2-3 day history of right ear pain. Constant dull ache/pressure. Associated episodes of worsening pain, stabbing. No known precipitating cause for worsening pain; swallowing, eating, time of day, activity. Has not taken any OTC medication for symptom relief. Patient has been using Flonase nasal spray. Patient with associated lingering rhinorrhea; rarely has to blow nose.   Patient seen at [facility] on 02/07/2018, two weeks ago, with three day history of flu-ike symptoms; nasal congestion, sinus pressure/congestion, and cough. Negative rapid flu test. Offered patient antiviral, treating influenza empirically. Patient agreed.  Patient states since flu episode, has had mild right ear fullness. Denies fever, chills, headache, ear discharge/drainage, change in hearing, tinnitus, sinus pain/pressure, postnasal drip, cough. Denies imbalance, dizziness/lightheadedness, room spinning sensation. Patient denies history of otitis media as an adult. Has occasionally had "fluid in the ear" with episodes of sinusitis. Has never been seen by an ENT.  A limited review of symptoms was performed, pertinent positives and negatives as mentioned in HPI.  The following portions of the patient's history were reviewed and updated as appropriate: allergies, current medications and past medical history.  Patient Active Problem List   Diagnosis Date Noted  . Gallstones 01/15/2018  . Easy bruising 01/12/2018  . Vitamin D deficiency 07/12/2017  . Acne 07/12/2017  . Encounter for preventive health examination  06/25/2017  . Exposure to influenza 01/28/2016  . Headache, chronic daily 09/18/2015  . Psoriasis 09/18/2015  . 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 Hormone deficiency 03/12/2013    Allergies  Allergen Reactions  . Biaxin [Clarithromycin] Other (See Comments)    Other reaction(s): Hallucination hallucinations  . Sulfa Antibiotics Other (See Comments)  . Penicillins Rash    Current Outpatient Medications on File Prior to Visit  Medication Sig Dispense Refill  . Adapalene 0.3 % gel   2  . albuterol (PROVENTIL HFA) 108 (90 Base) MCG/ACT inhaler Inhale 2 puffs into the lungs every 6 (six) hours as needed for wheezing or shortness of breath. 1 Inhaler 1  . ALPRAZolam (XANAX) 0.25 MG tablet TAKE 1 TABLET BY MOUTH EACH NIGHT AT BEDTIME AS NEEDED FOR SLEEP 30 tablet 5  . amitriptyline (ELAVIL) 50 MG tablet TAKE 1 TABLET BY MOUTH AT BEDTIME. 90 tablet 1  . Apremilast (OTEZLA) 30 MG TABS Take 1 tablet by mouth twice daily as directed 60 tablet 5  . butalbital-acetaminophen-caffeine (FIORICET, ESGIC) 50-325-40 MG tablet TAKE 1 TO 2 TABLETS BY MOUTH EVERY 6 HOURS AS NEEDED FOR HEADACHE. MAX OF 4 TABLETS DAILY. 60 tablet 5  . cholecalciferol (VITAMIN D3) 25 MCG (1000 UT) tablet Take 4,000 Units by mouth daily.    . clobetasol (TEMOVATE) 0.05 % external solution   3  . doxycycline (DORYX) 100 MG EC tablet   3  . fluticasone (FLONASE) 50 MCG/ACT nasal spray Place 2 sprays into both nostrils daily. 16 g 0  . metoprolol tartrate (LOPRESSOR) 25  MG tablet Take 25 mg by mouth 1 day or 1 dose.    . pimecrolimus (ELIDEL) 1 % cream   3   No current facility-administered medications on file prior to visit.        Objective:   Vitals:   02/20/18 0818  BP: 120/86  Pulse: 82  Resp: 16  Temp: 98.2 F (36.8 C)  SpO2: 99%     Wt Readings from Last 3 Encounters:   02/20/18 154 lb (69.9 kg)  02/07/18 156 lb (70.8 kg)  01/15/18 157 lb (71.2 kg)    Physical Exam:   General Appearance:  Patient sitting comfortably on examination table. Conversational. Peri Jefferson self-historian. In no acute distress. Afebrile.   Head:  Normocephalic, without obvious abnormality, atraumatic  Eyes:  PERRL, conjunctiva/corneas clear, EOM's intact  Ears:  Bilateral ear canals WNL. No erythema or edema. No discharge/drainage. Bilateral TMs WNL. No erythema or injection. Possible mild serous effusion, equal bilaterally. No bulging or retraction. Good light reflex. Easy to see landmarks. No scar tissue.  Nose: Nares normal, septum midline. No discharge. Normal mucosa. No sinus tenderness with percussion/palpation.  Throat: Lips, mucosa, and tongue normal; teeth and gums normal. Throat reveals no erythema. Tonsils with no enlargement or exudate.  Neck: Supple, symmetrical, trachea midline, no adenopathy  Lungs:   Clear to auscultation bilaterally, respirations unlabored  Heart:  Regular rate and rhythm, S1 and S2 normal, no murmur, rub, or gallop  Extremities: Extremities normal, atraumatic, no cyanosis or edema  Pulses: 2+ and symmetric  Skin: Skin color, texture, turgor normal, no rashes or lesions  Lymph nodes: Cervical, supraclavicular, and axillary nodes normal  Neurologic: Normal    Assessment & Plan:    Exam findings, diagnosis etiology and medication use and indications reviewed with patient. Follow-Up and discharge instructions provided. No emergent/urgent issues found on exam.  Patient education was provided.   Patient verbalized understanding of information provided and agrees with plan of care (POC), all questions answered. The patient is advised to call or return to clinic if condition does not see an improvement in symptoms, or to seek the care of the closest emergency department if condition worsens with the below plan.    1. Acute dysfunction of right  eustachian tube - loratadine-pseudoephedrine (CLARITIN-D 24 HOUR) 10-240 MG 24 hr tablet; Take 1 tablet by mouth daily for 14 days.  Dispense: 14 tablet; Refill: 0 - predniSONE (DELTASONE) 50 MG tablet; Take 1 tablet (50 mg total) by mouth daily with breakfast for 5 days.  Dispense: 5 tablet; Refill: 0  Patient with resolving flu/URI episode. Lingering rhinorrhea and right ear pressure, which has now developed in to right ear pain. VSS, afebrile, in no acute distress, benign ear physical exam. Suspect patient has ETD/serous effusion of right ear. Prescribed Claritin-D, to be taken in conjunction with Flonase. Gave wait & hold prednisone script; to start in a few days if symptoms not improving. Advised patient follow-up with ENT if right ear pain does not resolve with prednisone course. Advised patient, she may return to [facility] at any time for [place] to look at her ear, ensure no erythema suggestive of AOM. Advised patient f/u with PCP or urgent care if symptoms worsen. Patient agrees with plan.    Janalyn Harder, MHS, PA-C Rulon Sera, MHS, PA-C Advanced Practice Provider [facility]  [address], [facility], 1st Floor [place], [place] [address] (p):  [phone] [email or web address]

## 2018-02-22 ENCOUNTER — Telehealth: Payer: Self-pay | Admitting: Emergency Medicine

## 2018-02-22 NOTE — Telephone Encounter (Signed)
Left message following up on visit with [facility] 

## 2018-03-01 ENCOUNTER — Telehealth: Payer: No Typology Code available for payment source | Admitting: Family

## 2018-03-01 DIAGNOSIS — B373 Candidiasis of vulva and vagina: Secondary | ICD-10-CM | POA: Diagnosis not present

## 2018-03-01 DIAGNOSIS — B3731 Acute candidiasis of vulva and vagina: Secondary | ICD-10-CM

## 2018-03-01 MED ORDER — FLUCONAZOLE 150 MG PO TABS: 150.0000 mg | ORAL_TABLET | Freq: Once | ORAL | 0 refills | Status: AC

## 2018-03-01 NOTE — Progress Notes (Signed)
Greater than 5 minutes, yet less than 10 minutes of time have been spent researching, coordinating, and implementing care for this patient today.  Thank you for the details you included in the comment boxes. Those details are very helpful in determining the best course of treatment for you and help us to provide the best care.  We are sorry that you are not feeling well. Here is how we plan to help! Based on what you shared with me it looks like you: May have a yeast vaginosis  Vaginosis is an inflammation of the vagina that can result in discharge, itching and pain. The cause is usually a change in the normal balance of vaginal bacteria or an infection. Vaginosis can also result from reduced estrogen levels after menopause.  The most common causes of vaginosis are:   Bacterial vaginosis which results from an overgrowth of one on several organisms that are normally present in your vagina.   Yeast infections which are caused by a naturally occurring fungus called candida.   Vaginal atrophy (atrophic vaginosis) which results from the thinning of the vagina from reduced estrogen levels after menopause.   Trichomoniasis which is caused by a parasite and is commonly transmitted by sexual intercourse.  Factors that increase your risk of developing vaginosis include: . Medications, such as antibiotics and steroids . Uncontrolled diabetes . Use of hygiene products such as bubble bath, vaginal spray or vaginal deodorant . Douching . Wearing damp or tight-fitting clothing . Using an intrauterine device (IUD) for birth control . Hormonal changes, such as those associated with pregnancy, birth control pills or menopause . Sexual activity . Having a sexually transmitted infection  Your treatment plan is A single Diflucan (fluconazole) 150mg tablet once.  I have electronically sent this prescription into the pharmacy that you have chosen.  Be sure to take all of the medication as directed. Stop  taking any medication if you develop a rash, tongue swelling or shortness of breath. Mothers who are breast feeding should consider pumping and discarding their breast milk while on these antibiotics. However, there is no consensus that infant exposure at these doses would be harmful.  Remember that medication creams can weaken latex condoms. .   HOME CARE:  Good hygiene may prevent some types of vaginosis from recurring and may relieve some symptoms:  . Avoid baths, hot tubs and whirlpool spas. Rinse soap from your outer genital area after a shower, and dry the area well to prevent irritation. Don't use scented or harsh soaps, such as those with deodorant or antibacterial action. . Avoid irritants. These include scented tampons and pads. . Wipe from front to back after using the toilet. Doing so avoids spreading fecal bacteria to your vagina.  Other things that may help prevent vaginosis include:  . Don't douche. Your vagina doesn't require cleansing other than normal bathing. Repetitive douching disrupts the normal organisms that reside in the vagina and can actually increase your risk of vaginal infection. Douching won't clear up a vaginal infection. . Use a latex condom. Both female and female latex condoms may help you avoid infections spread by sexual contact. . Wear cotton underwear. Also wear pantyhose with a cotton crotch. If you feel comfortable without it, skip wearing underwear to bed. Yeast thrives in moist environments Your symptoms should improve in the next day or two.  GET HELP RIGHT AWAY IF:  . You have pain in your lower abdomen ( pelvic area or over your ovaries) . You develop nausea   or vomiting . You develop a fever . Your discharge changes or worsens . You have persistent pain with intercourse . You develop shortness of breath, a rapid pulse, or you faint.  These symptoms could be signs of problems or infections that need to be evaluated by a medical provider  now.  MAKE SURE YOU    Understand these instructions.  Will watch your condition.  Will get help right away if you are not doing well or get worse.  Your e-visit answers were reviewed by a board certified advanced clinical practitioner to complete your personal care plan. Depending upon the condition, your plan could have included both over the counter or prescription medications. Please review your pharmacy choice to make sure that you have choses a pharmacy that is open for you to pick up any needed prescription, Your safety is important to us. If you have drug allergies check your prescription carefully.   You can use MyChart to ask questions about today's visit, request a non-urgent call back, or ask for a work or school excuse for 24 hours related to this e-Visit. If it has been greater than 24 hours you will need to follow up with your provider, or enter a new e-Visit to address those concerns. You will get a MyChart message within the next two days asking about your experience. I hope that your e-visit has been valuable and will speed your recovery.  

## 2018-03-14 ENCOUNTER — Other Ambulatory Visit: Payer: Self-pay | Admitting: Internal Medicine

## 2018-03-19 MED FILL — OTEZLA 30 MG TABS: 30 | 30 days supply | Qty: 60 | Fill #2

## 2018-04-11 MED FILL — AMITRIPTYLINE HCL 50 MG TAB: 50 | 90 days supply | Qty: 90 | Fill #0

## 2018-04-17 MED FILL — OTEZLA 30 MG TABS: 30 | 30 days supply | Qty: 60 | Fill #3

## 2018-04-23 ENCOUNTER — Other Ambulatory Visit: Payer: Self-pay

## 2018-04-23 ENCOUNTER — Ambulatory Visit: Payer: Self-pay | Admitting: Oncology

## 2018-05-02 ENCOUNTER — Telehealth: Payer: No Typology Code available for payment source | Admitting: Family

## 2018-05-02 DIAGNOSIS — R399 Unspecified symptoms and signs involving the genitourinary system: Secondary | ICD-10-CM

## 2018-05-02 DIAGNOSIS — N898 Other specified noninflammatory disorders of vagina: Secondary | ICD-10-CM

## 2018-05-02 NOTE — Progress Notes (Signed)
Based on what you shared with me, I feel your condition warrants further evaluation and I recommend that you be seen for a face to face office visit.  After reviewing your chart, It looks like you have been treated for several UTI's and vaginal discharge over the last  Months. You need to be seen face to face to rule out other causes.    NOTE: If you entered your credit card information for this eVisit, you will not be charged. You may see a "hold" on your card for the $35 but that hold will drop off and you will not have a charge processed.  If you are having a true medical emergency please call 911.  If you need an urgent face to face visit, [facility] has four urgent care centers for your convenience.    PLEASE NOTE: THE [facility] LOCATIONS AND URGENT CARE CLINICS DO NOT HAVE THE TESTING FOR CORONAVIRUS COVID19 AVAILABLE.  IF YOU FEEL YOU NEED THIS TEST YOU MUST GO TO A TRIAGE LOCATION AT ONE OF THE HOSPITAL EMERGENCY DEPARTMENTS   [email or web address] to reserve your spot online an avoid wait times  [facility] [address], [address] [place], [place] [address] Modified hours of operation: Monday-Friday, 10 AM to 6 PM  Saturday & Sunday 10 AM to 4 PM *Across the street from [employer]  [employer] (New Address!) [address] [place], [place] [address] *Just off [employer], across the road from [place]* Modified hours of operation: Monday-Friday, 10 AM to 5 PM  Closed Saturday & Sunday   The following sites will take your insurance:  . [facility]  [phone] Get Driving Directions Find a Provider at this Location  [address] [place], [place] [address] . 10 am to 8 pm Monday-Friday . 12 pm to 8 pm Saturday-Sunday   . [facility] at [facility]  [phone] Get Driving Directions Find a Provider at this Location  [address], [address] [place], [place]  [address] . 8 am to 8 pm Monday-Friday . 9 am to 6 pm Saturday . 11 am to 6 pm Sunday   . [facility] at [facility]  [phone] Get Driving Directions  [address] [place], [place] [address] . 8 am to 8 pm Monday-Friday . 8 am to 4 pm Saturday-Sunday   Your e-visit answers were reviewed by a board certified advanced clinical practitioner to complete your personal care plan.  Thank you for using e-Visits.

## 2018-07-13 MED FILL — OTEZLA 30 MG TABS: 30 | 30 days supply | Qty: 60 | Fill #4

## 2018-07-20 ENCOUNTER — Ambulatory Visit: Payer: Self-pay | Admitting: Oncology

## 2018-07-20 ENCOUNTER — Other Ambulatory Visit: Payer: Self-pay

## 2018-08-05 NOTE — Progress Notes (Signed)
[place]  [phone] [phone]  ID: Lori Douglas OB: [birth year]  MR#: [id number]  DXI#:[id number]  Patient Care Team: Crecencio Mc, MD as PCP - General (Internal Medicine)  CHIEF COMPLAINT: Easy bruising, iron deficiency anemia.  INTERVAL HISTORY: Patient returns to clinic today for repeat laboratory work and further evaluation.  She states she is no longer having bruising or any episodes of easy bleeding.  She currently feels well and is asymptomatic. She has no neurologic complaints.  She denies any recent fevers, night sweats, or unintentional weight loss.  She denies any chest pain, shortness of breath, cough, or hemoptysis.  She denies any nausea, vomiting, constipation, or diarrhea.  She has no urinary complaints.  Patient feels at her baseline offers no specific complaints today.  REVIEW OF SYSTEMS:   Review of Systems  Constitutional: Negative.  Negative for fever, malaise/fatigue and weight loss.  Respiratory: Negative.  Negative for cough, hemoptysis and shortness of breath.   Cardiovascular: Negative.  Negative for chest pain and leg swelling.  Gastrointestinal: Negative.  Negative for abdominal pain, blood in stool and melena.  Genitourinary: Negative.  Negative for hematuria.  Musculoskeletal: Negative.  Negative for back pain.  Skin: Negative.  Negative for rash.  Neurological: Negative.  Negative for dizziness, focal weakness, weakness and headaches.  Endo/Heme/Allergies: Negative.  Does not bruise/bleed easily.  Psychiatric/Behavioral: Negative.  The patient is not nervous/anxious.     As per HPI. Otherwise, a complete review of systems is negative.  PAST MEDICAL HISTORY: Past Medical History:  Diagnosis Date  . Frequent headaches   . Heart murmur   . Migraine   . Preeclampsia   . Psoriasis     PAST SURGICAL HISTORY: Past Surgical History:  Procedure Laterality Date  . CESAREAN SECTION  2015  .  CHOLECYSTECTOMY  2015    FAMILY HISTORY: Family History  Problem Relation Age of Onset  . Arthritis Mother   . Anxiety disorder Mother   . Arthritis Father   . Hyperlipidemia Father   . Hypertension Father   . Depression Father   . Anxiety disorder Father   . Hodgkin's lymphoma Father   . Heart disease Father   . Endometriosis Sister   . Psoriasis Brother   . Diabetes Paternal Aunt   . Arthritis Maternal Grandmother   . Cancer Maternal Grandmother        ovarian and uterine cancer, breast cancer  . Heart disease Maternal Grandmother   . Stroke Maternal Grandmother   . Hypertension Maternal Grandmother   . Cancer Paternal Grandmother        colon cancer  . Alcohol abuse Paternal Grandfather     ADVANCED DIRECTIVES (Y/N):  N  HEALTH MAINTENANCE: Social History   Tobacco Use  . Smoking status: Never Smoker  . Smokeless tobacco: Never Used  Substance Use Topics  . Alcohol use: No  . Drug use: No     Colonoscopy:  PAP:  Bone density:  Lipid panel:  Allergies  Allergen Reactions  . Biaxin [Clarithromycin] Other (See Comments)    Other reaction(s): Hallucination hallucinations  . Sulfa Antibiotics Other (See Comments)  . Penicillins Rash    Current Outpatient Medications  Medication Sig Dispense Refill  . Adapalene 0.3 % gel   2  . albuterol (PROVENTIL HFA) 108 (90 Base) MCG/ACT inhaler Inhale 2 puffs into the lungs every 6 (six) hours as needed for wheezing or shortness of breath. 1 Inhaler 1  . ALPRAZolam (XANAX) 0.25  MG tablet TAKE 1 TABLET BY MOUTH EACH NIGHT AT BEDTIME AS NEEDED FOR SLEEP 30 tablet 5  . amitriptyline (ELAVIL) 50 MG tablet TAKE 1 TABLET BY MOUTH AT BEDTIME. 90 tablet 1  . Apremilast (OTEZLA) 30 MG TABS Take 1 tablet by mouth twice daily as directed 60 tablet 5  . butalbital-acetaminophen-caffeine (FIORICET, ESGIC) 50-325-40 MG tablet TAKE 1 TO 2 TABLETS BY MOUTH EVERY 6 HOURS AS NEEDED FOR HEADACHE. MAX OF 4 TABLETS DAILY. 60 tablet 5  .  cholecalciferol (VITAMIN D3) 25 MCG (1000 UT) tablet Take 4,000 Units by mouth daily.    . clobetasol (TEMOVATE) 0.05 % external solution   3  . metoprolol tartrate (LOPRESSOR) 25 MG tablet Take 25 mg by mouth 1 day or 1 dose.    . pimecrolimus (ELIDEL) 1 % cream   3   No current facility-administered medications for this visit.     OBJECTIVE: Vitals:   08/10/18 1431  BP: 114/80  Pulse: 82  Temp: 98.3 F (36.8 C)     Body mass index is 28.5 kg/m.    ECOG FS:0 - Asymptomatic  General: Well-developed, well-nourished, no acute distress. Eyes: Pink conjunctiva, anicteric sclera. HEENT: Normocephalic, moist mucous membranes. Lungs: Clear to auscultation bilaterally. Heart: Regular rate and rhythm. No rubs, murmurs, or gallops. Abdomen: Soft, nontender, nondistended. No organomegaly noted, normoactive bowel sounds. Musculoskeletal: No edema, cyanosis, or clubbing. Neuro: Alert, answering all questions appropriately. Cranial nerves grossly intact. Skin: No rashes or petechiae noted.  No ecchymosis noted. Psych: Normal affect.  LAB RESULTS:  Lab Results  Component Value Date   NA 136 01/04/2018   K 3.8 01/04/2018   CL 104 01/04/2018   CO2 25 01/04/2018   GLUCOSE 85 01/04/2018   BUN 10 01/04/2018   CREATININE 0.88 01/04/2018   CALCIUM 9.3 01/04/2018   PROT 7.0 01/04/2018   ALBUMIN 4.5 01/04/2018   AST 13 01/04/2018   ALT 14 01/04/2018   ALKPHOS 64 01/04/2018   BILITOT 0.5 01/04/2018   GFRNONAA >60 06/24/2013   GFRAA >60 06/24/2013    Lab Results  Component Value Date   WBC 5.6 08/10/2018   NEUTROABS 2.8 08/10/2018   HGB 12.0 08/10/2018   HCT 37.5 08/10/2018   MCV 89.5 08/10/2018   PLT 255 08/10/2018   Lab Results  Component Value Date   IRON 64 08/10/2018   TIBC 364 08/10/2018   IRONPCTSAT 18 08/10/2018   Lab Results  Component Value Date   FERRITIN 28 08/10/2018     STUDIES: No results found.  ASSESSMENT: Easy bruising  PLAN:   1. Easy bruising:  Resolved.  PT, PTT, platelet count, and platelet function assay are all within normal limits.  Von Willebrand panel and peripheral blood flow cytometry are also within normal limits.  No intervention is needed at this time.  No further follow-up is been scheduled.  Please refer patient back if there is any questions or concerns.   2.  Iron deficiency: Patient's hemoglobin and iron stores are now within normal limits.  No intervention is needed at this time.  I recommended patient continue with oral iron supplementation.    Patient expressed understanding and was in agreement with this plan. She also understands that She can call clinic at any time with any questions, concerns, or complaints.    Jeralyn Ruthsimothy J Caulder Wehner, MD   08/11/2018 9:02 AM

## 2018-08-09 ENCOUNTER — Other Ambulatory Visit: Payer: Self-pay | Admitting: *Deleted

## 2018-08-09 DIAGNOSIS — R238 Other skin changes: Secondary | ICD-10-CM

## 2018-08-09 DIAGNOSIS — R233 Spontaneous ecchymoses: Secondary | ICD-10-CM

## 2018-08-10 ENCOUNTER — Inpatient Hospital Stay: Payer: No Typology Code available for payment source | Attending: Oncology

## 2018-08-10 ENCOUNTER — Other Ambulatory Visit: Payer: Self-pay

## 2018-08-10 ENCOUNTER — Inpatient Hospital Stay (HOSPITAL_BASED_OUTPATIENT_CLINIC_OR_DEPARTMENT_OTHER): Payer: No Typology Code available for payment source | Admitting: Oncology

## 2018-08-10 ENCOUNTER — Encounter: Payer: Self-pay | Admitting: Oncology

## 2018-08-10 VITALS — BP 114/80 | HR 82 | Temp 98.3°F | Wt 155.8 lb

## 2018-08-10 DIAGNOSIS — D509 Iron deficiency anemia, unspecified: Secondary | ICD-10-CM | POA: Diagnosis present

## 2018-08-10 DIAGNOSIS — Z808 Family history of malignant neoplasm of other organs or systems: Secondary | ICD-10-CM | POA: Diagnosis not present

## 2018-08-10 DIAGNOSIS — R238 Other skin changes: Secondary | ICD-10-CM

## 2018-08-10 DIAGNOSIS — Z79899 Other long term (current) drug therapy: Secondary | ICD-10-CM | POA: Insufficient documentation

## 2018-08-10 DIAGNOSIS — R233 Spontaneous ecchymoses: Secondary | ICD-10-CM

## 2018-08-10 DIAGNOSIS — Z800 Family history of malignant neoplasm of digestive organs: Secondary | ICD-10-CM | POA: Diagnosis not present

## 2018-08-10 DIAGNOSIS — R011 Cardiac murmur, unspecified: Secondary | ICD-10-CM | POA: Insufficient documentation

## 2018-08-10 DIAGNOSIS — R51 Headache: Secondary | ICD-10-CM | POA: Diagnosis not present

## 2018-08-10 LAB — CBC WITH DIFFERENTIAL/PLATELET
Abs Immature Granulocytes: 0.01 10*3/uL (ref 0.00–0.07)
Basophils Absolute: 0.1 10*3/uL (ref 0.0–0.1)
Basophils Relative: 1 %
Eosinophils Absolute: 0.2 10*3/uL (ref 0.0–0.5)
Eosinophils Relative: 3 %
HCT: 37.5 % (ref 36.0–46.0)
Hemoglobin: 12 g/dL (ref 12.0–15.0)
Immature Granulocytes: 0 %
Lymphocytes Relative: 40 %
Lymphs Abs: 2.2 10*3/uL (ref 0.7–4.0)
MCH: 28.6 pg (ref 26.0–34.0)
MCHC: 32 g/dL (ref 30.0–36.0)
MCV: 89.5 fL (ref 80.0–100.0)
Monocytes Absolute: 0.4 10*3/uL (ref 0.1–1.0)
Monocytes Relative: 7 %
Neutro Abs: 2.8 10*3/uL (ref 1.7–7.7)
Neutrophils Relative %: 49 %
Platelets: 255 10*3/uL (ref 150–400)
RBC: 4.19 MIL/uL (ref 3.87–5.11)
RDW: 12.8 % (ref 11.5–15.5)
WBC: 5.6 10*3/uL (ref 4.0–10.5)
nRBC: 0 % (ref 0.0–0.2)

## 2018-08-10 LAB — IRON AND TIBC
Iron: 64 ug/dL (ref 28–170)
Saturation Ratios: 18 % (ref 10.4–31.8)
TIBC: 364 ug/dL (ref 250–450)
UIBC: 300 ug/dL

## 2018-08-10 LAB — FERRITIN: Ferritin: 28 ng/mL (ref 11–307)

## 2018-08-14 ENCOUNTER — Other Ambulatory Visit: Payer: Self-pay | Admitting: Internal Medicine

## 2018-08-15 ENCOUNTER — Ambulatory Visit: Payer: No Typology Code available for payment source | Admitting: Internal Medicine

## 2018-08-17 ENCOUNTER — Other Ambulatory Visit: Payer: Self-pay

## 2018-08-20 ENCOUNTER — Other Ambulatory Visit: Payer: Self-pay | Admitting: Internal Medicine

## 2018-08-20 MED ORDER — AMITRIPTYLINE HCL 50 MG PO TABS: 50.0000 mg | ORAL_TABLET | Freq: Every day | ORAL | 0 refills | Status: DC

## 2018-08-20 NOTE — Telephone Encounter (Signed)
NO OV with PCP since 06/23/17

## 2018-08-22 ENCOUNTER — Other Ambulatory Visit: Payer: Self-pay

## 2018-08-23 ENCOUNTER — Ambulatory Visit: Payer: No Typology Code available for payment source | Admitting: Internal Medicine

## 2018-08-23 ENCOUNTER — Telehealth: Payer: Self-pay

## 2018-08-23 NOTE — Telephone Encounter (Signed)
Copied from [place] [phone]. Topic: General - Other >> Aug 23, 2018  7:09 AM Keene Breath wrote: Reason for CRM: Patient called to ask the nurse to call her regarding her appt. This morning.  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  CB# [phone]

## 2018-08-23 NOTE — Telephone Encounter (Signed)
Pt rescheduled her appt for 09/21/2018.

## 2018-08-23 NOTE — Telephone Encounter (Signed)
Returned patient call.  No answer.  Left message that patient will need to reschedule physical appt and offered virtual appt. Didn't cancel patient's appt yet just in case she would like a virtual visit for COVID counseling.  Please advise.

## 2018-09-04 ENCOUNTER — Encounter: Payer: Self-pay | Admitting: Internal Medicine

## 2018-09-19 ENCOUNTER — Other Ambulatory Visit: Payer: Self-pay | Admitting: Pharmacist

## 2018-09-19 MED ORDER — OTEZLA 30 MG PO TABS: ORAL_TABLET | ORAL | 4 refills | Status: DC

## 2018-09-19 MED FILL — OTEZLA 30 MG TABS: 30 | 30 days supply | Qty: 60 | Fill #0

## 2018-09-21 ENCOUNTER — Encounter: Payer: No Typology Code available for payment source | Admitting: Internal Medicine

## 2018-09-27 ENCOUNTER — Other Ambulatory Visit: Payer: Self-pay

## 2018-09-28 ENCOUNTER — Ambulatory Visit (INDEPENDENT_AMBULATORY_CARE_PROVIDER_SITE_OTHER): Payer: No Typology Code available for payment source | Admitting: Internal Medicine

## 2018-09-28 ENCOUNTER — Encounter: Payer: Self-pay | Admitting: Internal Medicine

## 2018-09-28 ENCOUNTER — Other Ambulatory Visit: Payer: Self-pay

## 2018-09-28 VITALS — BP 110/78 | HR 89 | Temp 97.3°F | Resp 14 | Ht 62.0 in | Wt 156.8 lb

## 2018-09-28 DIAGNOSIS — E559 Vitamin D deficiency, unspecified: Secondary | ICD-10-CM

## 2018-09-28 DIAGNOSIS — R519 Headache, unspecified: Secondary | ICD-10-CM

## 2018-09-28 DIAGNOSIS — Z79899 Other long term (current) drug therapy: Secondary | ICD-10-CM

## 2018-09-28 DIAGNOSIS — L409 Psoriasis, unspecified: Secondary | ICD-10-CM

## 2018-09-28 DIAGNOSIS — R51 Headache: Secondary | ICD-10-CM

## 2018-09-28 DIAGNOSIS — Z0000 Encounter for general adult medical examination without abnormal findings: Secondary | ICD-10-CM

## 2018-09-28 MED ORDER — BUTALBITAL-APAP-CAFFEINE 50-325-40 MG PO TABS: ORAL_TABLET | ORAL | 2 refills | Status: DC

## 2018-09-28 MED ORDER — BUTALBITAL-APAP-CAFFEINE 50-325-40 MG PO TABS: ORAL_TABLET | ORAL | 5 refills | Status: DC

## 2018-09-28 MED ORDER — ONDANSETRON 4 MG PO TBDP: 4.0000 mg | ORAL_TABLET | Freq: Three times a day (TID) | ORAL | 0 refills | Status: DC | PRN

## 2018-09-28 MED ORDER — ALPRAZOLAM 0.25 MG PO TABS: ORAL_TABLET | ORAL | 5 refills | Status: DC

## 2018-09-28 MED ORDER — METOPROLOL SUCCINATE ER 25 MG PO TB24: 25.0000 mg | ORAL_TABLET | Freq: Every day | ORAL | 3 refills | Status: DC

## 2018-09-28 MED ORDER — FLUCONAZOLE 150 MG PO TABS: 150.0000 mg | ORAL_TABLET | Freq: Every day | ORAL | 0 refills | Status: DC

## 2018-09-28 NOTE — Patient Instructions (Signed)
I have refilled the fioricet and alprazolam and corrected the metoprolol as well.  You will also be picking up zofran    Health Maintenance, Female Adopting a healthy lifestyle and getting preventive care are important in promoting health and wellness. Ask your health care provider about:  The right schedule for you to have regular tests and exams.  Things you can do on your own to prevent diseases and keep yourself healthy. What should I know about diet, weight, and exercise? Eat a healthy diet   Eat a diet that includes plenty of vegetables, fruits, low-fat dairy products, and lean protein.  Do not eat a lot of foods that are high in solid fats, added sugars, or sodium. Maintain a healthy weight Body mass index (BMI) is used to identify weight problems. It estimates body fat based on height and weight. Your health care provider can help determine your BMI and help you achieve or maintain a healthy weight. Get regular exercise Get regular exercise. This is one of the most important things you can do for your health. Most adults should:  Exercise for at least 150 minutes each week. The exercise should increase your heart rate and make you sweat (moderate-intensity exercise).  Do strengthening exercises at least twice a week. This is in addition to the moderate-intensity exercise.  Spend less time sitting. Even light physical activity can be beneficial. Watch cholesterol and blood lipids Have your blood tested for lipids and cholesterol at 31 years of age, then have this test every 5 years. Have your cholesterol levels checked more often if:  Your lipid or cholesterol levels are high.  You are older than 31 years of age.  You are at high risk for heart disease. What should I know about cancer screening? Depending on your health history and family history, you may need to have cancer screening at various ages. This may include screening for:  Breast cancer.  Cervical cancer.   Colorectal cancer.  Skin cancer.  Lung cancer. What should I know about heart disease, diabetes, and high blood pressure? Blood pressure and heart disease  High blood pressure causes heart disease and increases the risk of stroke. This is more likely to develop in people who have high blood pressure readings, are of African descent, or are overweight.  Have your blood pressure checked: ? Every 3-5 years if you are 11-76 years of age. ? Every year if you are 16 years old or older. Diabetes Have regular diabetes screenings. This checks your fasting blood sugar level. Have the screening done:  Once every three years after age 42 if you are at a normal weight and have a low risk for diabetes.  More often and at a younger age if you are overweight or have a high risk for diabetes. What should I know about preventing infection? Hepatitis B If you have a higher risk for hepatitis B, you should be screened for this virus. Talk with your health care provider to find out if you are at risk for hepatitis B infection. Hepatitis C Testing is recommended for:  Everyone born from [birth year] through 1963.  Anyone with known risk factors for hepatitis C. Sexually transmitted infections (STIs)  Get screened for STIs, including gonorrhea and chlamydia, if: ? You are sexually active and are younger than 31 years of age. ? You are older than 31 years of age and your health care provider tells you that you are at risk for this type of infection. ? Your sexual  activity has changed since you were last screened, and you are at increased risk for chlamydia or gonorrhea. Ask your health care provider if you are at risk.  Ask your health care provider about whether you are at high risk for HIV. Your health care provider may recommend a prescription medicine to help prevent HIV infection. If you choose to take medicine to prevent HIV, you should first get tested for HIV. You should then be tested every 3 months for  as long as you are taking the medicine. Pregnancy  If you are about to stop having your period (premenopausal) and you may become pregnant, seek counseling before you get pregnant.  Take 400 to 800 micrograms (mcg) of folic acid every day if you become pregnant.  Ask for birth control (contraception) if you want to prevent pregnancy. Osteoporosis and menopause Osteoporosis is a disease in which the bones lose minerals and strength with aging. This can result in bone fractures. If you are 5 years old or older, or if you are at risk for osteoporosis and fractures, ask your health care provider if you should:  Be screened for bone loss.  Take a calcium or vitamin D supplement to lower your risk of fractures.  Be given hormone replacement therapy (HRT) to treat symptoms of menopause. Follow these instructions at home: Lifestyle  Do not use any products that contain nicotine or tobacco, such as cigarettes, e-cigarettes, and chewing tobacco. If you need help quitting, ask your health care provider.  Do not use street drugs.  Do not share needles.  Ask your health care provider for help if you need support or information about quitting drugs. Alcohol use  Do not drink alcohol if: ? Your health care provider tells you not to drink. ? You are pregnant, may be pregnant, or are planning to become pregnant.  If you drink alcohol: ? Limit how much you use to 0-1 drink a day. ? Limit intake if you are breastfeeding.  Be aware of how much alcohol is in your drink. In the [place] one drink equals one 12 oz bottle of beer (355 mL), one 5 oz glass of wine (148 mL), or one 1 oz glass of hard liquor (44 mL). General instructions  Schedule regular health, dental, and eye exams.  Stay current with your vaccines.  Tell your health care provider if: ? You often feel depressed. ? You have ever been abused or do not feel safe at home. Summary  Adopting a healthy lifestyle and getting preventive  care are important in promoting health and wellness.  Follow your health care provider's instructions about healthy diet, exercising, and getting tested or screened for diseases.  Follow your health care provider's instructions on monitoring your cholesterol and blood pressure. This information is not intended to replace advice given to you by your health care provider. Make sure you discuss any questions you have with your health care provider. Document Released: 07/05/2010 Document Revised: 12/13/2017 Document Reviewed: 12/13/2017 Elsevier Patient Education  2020 [employer].

## 2018-09-28 NOTE — Progress Notes (Signed)
Patient ID: Lori Douglas, female    DOB: [birth year]  Age: 33 y.o. MRN: [id number]  The patient is here for annual preventive examination and management of other chronic and acute problems.   The risk factors are reflected in the social history.  The roster of all physicians providing medical care to patient - is listed in the Snapshot section of the chart.  Activities of daily living:  The patient is 100% independent in all ADLs: dressing, toileting, feeding as well as independent mobility  Home safety : The patient has smoke detectors in the home. They wear seatbelts.  There are no firearms at home. There is no violence in the home.   There is no risks for hepatitis, STDs or HIV. There is no   history of blood transfusion. They have no travel history to infectious disease endemic areas of the world.  The patient has seen their dentist in the last six month. They have seen their eye doctor in the last year. She does not   have excessive sun exposure. Discussed the need for sun protection: hats, long sleeves and use of sunscreen if there is significant sun exposure.   Diet: the importance of a healthy diet is discussed. They do have a healthy diet.  The benefits of regular aerobic exercise were discussed. She walks 4 times per week ,  20 minutes.   Depression screen: there are no signs or vegative symptoms of depression- irritability, change in appetite, anhedonia, sadness/tearfullness.  The following portions of the patient's history were reviewed and updated as appropriate: allergies, current medications, past family history, past medical history,  past surgical history, past social history  and problem list.  Visual acuity was not assessed per patient preference since she has regular follow up with her ophthalmologist. Hearing and body mass index were assessed and reviewed.   During the course of the visit the patient was educated and counseled about appropriate screening and  preventive services including : fall prevention , diabetes screening, nutrition counseling, colorectal cancer screening, and recommended immunizations.    CC: The primary encounter diagnosis was Vitamin D deficiency. Diagnoses of Long-term use of high-risk medication, Encounter for preventive health examination, Psoriasis, and Headache, chronic daily were also pertinent to this visit.  Stressed out about 375 yr old son diagnosed with with IGA deficiency, he is  now having Migraines with hospitalization recently.  Medication  Reactions. Son is now  Psychologist, counsellingeeing Neurology [facility] [place].   Patient denies insomnia ,  History of post partum depression,  Has been crying more,  But having better days lately.  Psoriasis managed with Dyke Bracketttesla for the past year by [facility]   Has several headaches per week,  Not true migrianes  menstrual related  managed with fioiricet and dark room    History Merry ProudBrandi has a past medical history of Frequent headaches, Heart murmur, Migraine, Preeclampsia, and Psoriasis.   She has a past surgical history that includes Cholecystectomy (2015) and Cesarean section (2015).   Her family history includes Alcohol abuse in her paternal grandfather; Anxiety disorder in her father and mother; Arthritis in her father, maternal grandmother, and mother; Cancer in her maternal grandmother and paternal grandmother; Depression in her father; Diabetes in her paternal aunt; Endometriosis in her sister; Heart disease in her father and maternal grandmother; Hodgkin's lymphoma in her father; Hyperlipidemia in her father; Hypertension in her father and maternal grandmother; Psoriasis in her brother; Stroke in her maternal grandmother.She reports that she  has never smoked. She has never used smokeless tobacco. She reports that she does not drink alcohol or use drugs.  Outpatient Medications Prior to Visit  Medication Sig Dispense Refill  . Adapalene 0.3 % gel   2  . albuterol  (PROVENTIL HFA) 108 (90 Base) MCG/ACT inhaler Inhale 2 puffs into the lungs every 6 (six) hours as needed for wheezing or shortness of breath. 1 Inhaler 1  . amitriptyline (ELAVIL) 50 MG tablet Take 1 tablet (50 mg total) by mouth at bedtime. 90 tablet 0  . Apremilast (OTEZLA) 30 MG TABS Take 1 tablet by mouth twice daily as directed 60 tablet 4  . cholecalciferol (VITAMIN D3) 25 MCG (1000 UT) tablet Take 4,000 Units by mouth daily.    . clobetasol (TEMOVATE) 0.05 % external solution   3  . pimecrolimus (ELIDEL) 1 % cream   3  . ALPRAZolam (XANAX) 0.25 MG tablet TAKE 1 TABLET BY MOUTH EACH NIGHT AT BEDTIME AS NEEDED FOR SLEEP 30 tablet 5  . butalbital-acetaminophen-caffeine (FIORICET, ESGIC) 50-325-40 MG tablet TAKE 1 TO 2 TABLETS BY MOUTH EVERY 6 HOURS AS NEEDED FOR HEADACHE. MAX OF 4 TABLETS DAILY. 60 tablet 5  . metoprolol tartrate (LOPRESSOR) 25 MG tablet Take 25 mg by mouth 1 day or 1 dose.     No facility-administered medications prior to visit.     Review of Systems   Patient denies headache, fevers, malaise, unintentional weight loss, skin rash, eye pain, sinus congestion and sinus pain, sore throat, dysphagia,  hemoptysis , cough, dyspnea, wheezing, chest pain, palpitations, orthopnea, edema, abdominal pain, nausea, melena, diarrhea, constipation, flank pain, dysuria, hematuria, urinary  Frequency, nocturia, numbness, tingling, seizures,  Focal weakness, Loss of consciousness,  Tremor, insomnia, depression, anxiety, and suicidal ideation.      Objective:  BP 110/78 (BP Location: Left Arm, Patient Position: Sitting, Cuff Size: Normal)   Pulse 89   Temp (!) 97.3 F (36.3 C) (Temporal)   Resp 14   Ht 5\' 2"  (1.575 m)   Wt 156 lb 12.8 oz (71.1 kg)   SpO2 99%   BMI 28.68 kg/m   Physical Exam  General appearance: alert, cooperative and appears stated age Head: Normocephalic, without obvious abnormality, atraumatic Eyes: conjunctivae/corneas clear. PERRL, EOM's intact. Fundi  benign. Ears: normal TM's and external ear canals both ears Nose: Nares normal. Septum midline. Mucosa normal. No drainage or sinus tenderness. Throat: lips, mucosa, and tongue normal; teeth and gums normal Neck: no adenopathy, no carotid bruit, no JVD, supple, symmetrical, trachea midline and thyroid not enlarged, symmetric, no tenderness/mass/nodules Lungs: clear to auscultation bilaterally Breasts: normal appearance, no masses or tenderness Heart: regular rate and rhythm, S1, S2 normal, no murmur, click, rub or gallop Abdomen: soft, non-tender; bowel sounds normal; no masses,  no organomegaly Extremities: extremities normal, atraumatic, no cyanosis or edema Pulses: 2+ and symmetric Skin: Skin color, texture, turgor normal. No rashes or lesions Neurologic: Alert and oriented X 3, normal strength and tone. Normal symmetric reflexes. Normal coordination and gait.     Assessment & Plan:   Problem List Items Addressed This Visit      Unprioritized   Vitamin D deficiency - Primary   Relevant Orders   VITAMIN D 25 Hydroxy (Vit-D Deficiency, Fractures) (Completed)   Headache, chronic daily    Managing migraines with foiricet and tylenol .      Relevant Medications   metoprolol succinate (TOPROL-XL) 25 MG 24 hr tablet   butalbital-acetaminophen-caffeine (FIORICET) 50-325-40 MG tablet  Psoriasis    Managed by dermatology with Jettie Booze for preventive health examination    age appropriate education and counseling updated, referrals for preventative services and immunizations addressed, dietary and smoking counseling addressed, most recent labs reviewed.  I have personally reviewed and have noted:  1) the patient's medical and social history 2) The pt's use of alcohol, tobacco, and illicit drugs 3) The patient's current medications and supplements 4) Functional ability including ADL's, fall risk, home safety risk, hearing and visual impairment 5) Diet and physical  activities 6) Evidence for depression or mood disorder 7) The patient's height, weight, and BMI have been recorded in the chart  I have made referrals, and provided counseling and education based on review of the above       Other Visit Diagnoses    Long-term use of high-risk medication       Relevant Orders   Comprehensive metabolic panel (Completed)      I have discontinued Caitrin N. Zirkle's metoprolol tartrate. I am also having her start on ondansetron, fluconazole, and metoprolol succinate. Additionally, I am having her maintain her albuterol, cholecalciferol, Adapalene, clobetasol, pimecrolimus, amitriptyline, [place]la, ALPRAZolam, and butalbital-acetaminophen-caffeine.  Meds ordered this encounter  Medications  . ondansetron (ZOFRAN ODT) 4 MG disintegrating tablet    Sig: Take 1 tablet (4 mg total) by mouth every 8 (eight) hours as needed for nausea or vomiting.    Dispense:  20 tablet    Refill:  0  . fluconazole (DIFLUCAN) 150 MG tablet    Sig: Take 1 tablet (150 mg total) by mouth daily.    Dispense:  2 tablet    Refill:  0  . metoprolol succinate (TOPROL-XL) 25 MG 24 hr tablet    Sig: Take 1 tablet (25 mg total) by mouth daily.    Dispense:  90 tablet    Refill:  3  . DISCONTD: butalbital-acetaminophen-caffeine (FIORICET) 50-325-40 MG tablet    Sig: TAKE 1 TO 2 TABLETS BY MOUTH EVERY 6 HOURS AS NEEDED FOR HEADACHE. MAX OF 4 TABLETS DAILY.    Dispense:  60 tablet    Refill:  2  . ALPRAZolam (XANAX) 0.25 MG tablet    Sig: TAKE 1 TABLET BY MOUTH EACH NIGHT AT BEDTIME AS NEEDED FOR SLEEP    Dispense:  30 tablet    Refill:  5  . butalbital-acetaminophen-caffeine (FIORICET) 50-325-40 MG tablet    Sig: TAKE 1 TO 2 TABLETS BY MOUTH EVERY 6 HOURS AS NEEDED FOR HEADACHE. MAX OF 4 TABLETS DAILY.    Dispense:  60 tablet    Refill:  5    Medications Discontinued During This Encounter  Medication Reason  . metoprolol tartrate (LOPRESSOR) 25 MG tablet   .  butalbital-acetaminophen-caffeine (FIORICET, ESGIC) 50-325-40 MG tablet Reorder  . ALPRAZolam (XANAX) 0.25 MG tablet Reorder  . butalbital-acetaminophen-caffeine (FIORICET) 50-325-40 MG tablet Reorder    Follow-up: Return in about 6 months (around 03/28/2019).   Crecencio Mc, MD

## 2018-09-29 LAB — COMPREHENSIVE METABOLIC PANEL
AG Ratio: 2 (calc) (ref 1.0–2.5)
ALT: 12 U/L (ref 6–29)
AST: 14 U/L (ref 10–30)
Albumin: 4.2 g/dL (ref 3.6–5.1)
Alkaline phosphatase (APISO): 70 U/L (ref 31–125)
BUN: 9 mg/dL (ref 7–25)
CO2: 21 mmol/L (ref 20–32)
Calcium: 9.1 mg/dL (ref 8.6–10.2)
Chloride: 105 mmol/L (ref 98–110)
Creat: 0.78 mg/dL (ref 0.50–1.10)
Globulin: 2.1 g/dL (calc) (ref 1.9–3.7)
Glucose, Bld: 90 mg/dL (ref 65–99)
Potassium: 4 mmol/L (ref 3.5–5.3)
Sodium: 140 mmol/L (ref 135–146)
Total Bilirubin: 0.4 mg/dL (ref 0.2–1.2)
Total Protein: 6.3 g/dL (ref 6.1–8.1)

## 2018-09-29 LAB — VITAMIN D 25 HYDROXY (VIT D DEFICIENCY, FRACTURES): Vit D, 25-Hydroxy: 28 ng/mL — ABNORMAL LOW (ref 30–100)

## 2018-09-29 NOTE — Assessment & Plan Note (Addendum)
Managed by dermatology with [facility] Nail

## 2018-09-29 NOTE — Assessment & Plan Note (Signed)
Managing migraines with foiricet and tylenol .

## 2018-09-29 NOTE — Assessment & Plan Note (Signed)

## 2018-10-05 ENCOUNTER — Telehealth: Payer: No Typology Code available for payment source | Admitting: Physician Assistant

## 2018-10-05 ENCOUNTER — Telehealth: Payer: Self-pay | Admitting: Internal Medicine

## 2018-10-05 DIAGNOSIS — R519 Headache, unspecified: Secondary | ICD-10-CM

## 2018-10-05 DIAGNOSIS — J3489 Other specified disorders of nose and nasal sinuses: Secondary | ICD-10-CM

## 2018-10-05 DIAGNOSIS — R42 Dizziness and giddiness: Secondary | ICD-10-CM

## 2018-10-05 MED ORDER — PREDNISONE 10 MG PO TABS: ORAL_TABLET | ORAL | 0 refills | Status: DC

## 2018-10-05 MED ORDER — MECLIZINE HCL 25 MG PO TABS: 25.0000 mg | ORAL_TABLET | Freq: Three times a day (TID) | ORAL | 0 refills | Status: DC | PRN

## 2018-10-05 NOTE — Telephone Encounter (Signed)
Patient called and would like to talk to Dr. Derrel Nip or her CMA about a evisit she did today. Please call patient back, thanks.

## 2018-10-05 NOTE — Telephone Encounter (Signed)
Pt had an Evisit this morning for sinus issues with vertigo, pressure in right ear, runny nose, facial pressure that started about 3 days ago. Pt stated that the doctor on there told her that she would not treat her online that she would need to go to an UC. Pt stated that she is using the sinus rinse but would like to know if there is anything that she can take for the dizziness. Pt stated that she has a son that has an autoimmune disease and they try to stay away from [facility] and ED at all cost if possible.

## 2018-10-05 NOTE — Progress Notes (Signed)
Based on what you shared with me, I feel your condition warrants further evaluation and I recommend that you be seen for a face to face office visit.  NOTE: If you entered your credit card information for this eVisit, you will not be charged. You may see a "hold" on your card for the $35 but that hold will drop off and you will not have a charge processed.  If you are having a true medical emergency please call 911.     For an urgent face to face visit, [facility] has four urgent care centers for your convenience:   . [facility]    [phone]                  Get Driving Directions  [address] [place], [place] [address] . 10 am to 8 pm Monday-Friday . 12 pm to 8 pm Saturday-Sunday   . [facility]  [phone]                  Get Driving Directions  [address] [place], [place] [address] . 8 am to 8 pm Monday-Friday . 9 am to 6 pm Saturday . 11 am to 6 pm Sunday   . [facility] [place]  [phone]                  Get Driving Directions   [address] [place], [place] [address] . 8 am to 8 pm Monday-Friday . 8 am to 4 pm Saturday-Sunday    . [facility]                    Get Driving Directions  [phone]  [address] , [place] [address]  . Monday-Friday, 12 PM to 6 PM    Your e-visit answers were reviewed by a board certified advanced clinical practitioner to complete your personal care plan.  Thank you for using e-Visits.  Greater than 5 minutes, yet less than 10 minutes of time have been spent researching, coordinating, and implementing care for this patient today 

## 2018-11-01 NOTE — Telephone Encounter (Signed)
Pt is scheduled for a virtual visit tomorrow at 8am.

## 2018-11-02 ENCOUNTER — Ambulatory Visit (INDEPENDENT_AMBULATORY_CARE_PROVIDER_SITE_OTHER): Payer: No Typology Code available for payment source | Admitting: Internal Medicine

## 2018-11-02 ENCOUNTER — Encounter: Payer: Self-pay | Admitting: Internal Medicine

## 2018-11-02 ENCOUNTER — Other Ambulatory Visit: Payer: Self-pay

## 2018-11-02 DIAGNOSIS — U071 COVID-19: Secondary | ICD-10-CM | POA: Diagnosis not present

## 2018-11-02 DIAGNOSIS — J40 Bronchitis, not specified as acute or chronic: Secondary | ICD-10-CM | POA: Diagnosis not present

## 2018-11-02 HISTORY — DX: Bronchitis, not specified as acute or chronic: J40

## 2018-11-02 MED ORDER — AZITHROMYCIN 500 MG PO TABS: 500.0000 mg | ORAL_TABLET | Freq: Every day | ORAL | 0 refills | Status: DC

## 2018-11-02 MED ORDER — PREDNISONE 10 MG PO TABS: ORAL_TABLET | ORAL | 0 refills | Status: DC

## 2018-11-02 MED ORDER — BENZONATATE 200 MG PO CAPS: 200.0000 mg | ORAL_CAPSULE | Freq: Three times a day (TID) | ORAL | 1 refills | Status: DC | PRN

## 2018-11-02 MED ORDER — HYDROCOD POLST-CPM POLST ER 10-8 MG/5ML PO SUER: 5.0000 mL | Freq: Every evening | ORAL | 0 refills | Status: DC | PRN

## 2018-11-02 NOTE — Patient Instructions (Signed)
Person Under Monitoring Name: Lori Douglas  Location: [address] [place] [address]   Infection Prevention Recommendations for Individuals Confirmed to have, or Being Evaluated for, 2019 Novel Coronavirus (COVID-19) Infection Who Receive Care at Home  Individuals who are confirmed to have, or are being evaluated for, COVID-19 should follow the prevention steps below until a healthcare provider or local or state health department says they can return to normal activities.  Stay home except to get medical care You should restrict activities outside your home, except for getting medical care. Do not go to work, school, or public areas, and do not use public transportation or taxis.  Call ahead before visiting your doctor Before your medical appointment, call the healthcare provider and tell them that you have, or are being evaluated for, COVID-19 infection. This will help the healthcare provider's office take steps to keep other people from getting infected. Ask your healthcare provider to call the local or state health department.  Monitor your symptoms Seek prompt medical attention if your illness is worsening (e.g., difficulty breathing). Before going to your medical appointment, call the healthcare provider and tell them that you have, or are being evaluated for, COVID-19 infection. Ask your healthcare provider to call the local or state health department.  Wear a facemask You should wear a facemask that covers your nose and mouth when you are in the same room with other people and when you visit a healthcare provider. People who live with or visit you should also wear a facemask while they are in the same room with you.  Separate yourself from other people in your home As much as possible, you should stay in a different room from other people in your home. Also, you should use a separate bathroom, if available.  Avoid sharing household items You should not share  dishes, drinking glasses, cups, eating utensils, towels, bedding, or other items with other people in your home. After using these items, you should wash them thoroughly with soap and water.  Cover your coughs and sneezes Cover your mouth and nose with a tissue when you cough or sneeze, or you can cough or sneeze into your sleeve. Throw used tissues in a lined trash can, and immediately wash your hands with soap and water for at least 20 seconds or use an alcohol-based hand rub.  Wash your [employer] your hands often and thoroughly with soap and water for at least 20 seconds. You can use an alcohol-based hand sanitizer if soap and water are not available and if your hands are not visibly dirty. Avoid touching your eyes, nose, and mouth with unwashed hands.   Prevention Steps for Caregivers and Household Members of Individuals Confirmed to have, or Being Evaluated for, COVID-19 Infection Being Cared for in the Home  If you live with, or provide care at home for, a person confirmed to have, or being evaluated for, COVID-19 infection please follow these guidelines to prevent infection:  Follow healthcare provider's instructions Make sure that you understand and can help the patient follow any healthcare provider instructions for all care.  Provide for the patient's basic needs You should help the patient with basic needs in the home and provide support for getting groceries, prescriptions, and other personal needs.  Monitor the patient's symptoms If they are getting sicker, call his or her medical provider and tell them that the patient has, or is being evaluated for, COVID-19 infection. This will help the  healthcare provider's office take steps to keep other people from getting infected. Ask the healthcare provider to call the local or state health department.  Limit the number of people who have contact with the patient  If possible, have only one caregiver for the patient.  Other  household members should stay in another home or place of residence. If this is not possible, they should stay  in another room, or be separated from the patient as much as possible. Use a separate bathroom, if available.  Restrict visitors who do not have an essential need to be in the home.  Keep older adults, very young children, and other sick people away from the patient Keep older adults, very young children, and those who have compromised immune systems or chronic health conditions away from the patient. This includes people with chronic heart, lung, or kidney conditions, diabetes, and cancer.  Ensure good ventilation Make sure that shared spaces in the home have good air flow, such as from an air conditioner or an opened window, weather permitting.  Wash your hands often  Wash your hands often and thoroughly with soap and water for at least 20 seconds. You can use an alcohol based hand sanitizer if soap and water are not available and if your hands are not visibly dirty.  Avoid touching your eyes, nose, and mouth with unwashed hands.  Use disposable paper towels to dry your hands. If not available, use dedicated cloth towels and replace them when they become wet.  Wear a facemask and gloves  Wear a disposable facemask at all times in the room and gloves when you touch or have contact with the patient's blood, body fluids, and/or secretions or excretions, such as sweat, saliva, sputum, nasal mucus, vomit, urine, or feces.  Ensure the mask fits over your nose and mouth tightly, and do not touch it during use.  Throw out disposable facemasks and gloves after using them. Do not reuse.  Wash your hands immediately after removing your facemask and gloves.  If your personal clothing becomes contaminated, carefully remove clothing and launder. Wash your hands after handling contaminated clothing.  Place all used disposable facemasks, gloves, and other waste in a lined container before  disposing them with other household waste.  Remove gloves and wash your hands immediately after handling these items.  Do not share dishes, glasses, or other household items with the patient  Avoid sharing household items. You should not share dishes, drinking glasses, cups, eating utensils, towels, bedding, or other items with a patient who is confirmed to have, or being evaluated for, COVID-19 infection.  After the person uses these items, you should wash them thoroughly with soap and water.  Wash laundry thoroughly  Immediately remove and wash clothes or bedding that have blood, body fluids, and/or secretions or excretions, such as sweat, saliva, sputum, nasal mucus, vomit, urine, or feces, on them.  Wear gloves when handling laundry from the patient.  Read and follow directions on labels of laundry or clothing items and detergent. In general, wash and dry with the warmest temperatures recommended on the label.  Clean all areas the individual has used often  Clean all touchable surfaces, such as counters, tabletops, doorknobs, bathroom fixtures, toilets, phones, keyboards, tablets, and bedside tables, every day. Also, clean any surfaces that may have blood, body fluids, and/or secretions or excretions on them.  Wear gloves when cleaning surfaces the patient has come in contact with.  Use a diluted bleach solution (e.g., dilute bleach  with 1 part bleach and 10 parts water) or a household disinfectant with a label that says EPA-registered for coronaviruses. To make a bleach solution at home, add 1 tablespoon of bleach to 1 quart (4 cups) of water. For a larger supply, add  cup of bleach to 1 gallon (16 cups) of water.  Read labels of cleaning products and follow recommendations provided on product labels. Labels contain instructions for safe and effective use of the cleaning product including precautions you should take when applying the product, such as wearing gloves or eye protection  and making sure you have good ventilation during use of the product.  Remove gloves and wash hands immediately after cleaning.  Monitor yourself for signs and symptoms of illness Caregivers and household members are considered close contacts, should monitor their health, and will be asked to limit movement outside of the home to the extent possible. Follow the monitoring steps for close contacts listed on the symptom monitoring form.   ? If you have additional questions, contact your local health department or call the epidemiologist on call at [phone] (available 24/7). ? This guidance is subject to change. For the most up-to-date guidance from [facility], please refer to their website: [email or web address]

## 2018-11-02 NOTE — Assessment & Plan Note (Addendum)
Suspected, with test still pending  Will presume to be positive based on history . 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 Prednisone,  Azithromycin,  Cough suppressants.  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  Advised to go to ER if it falls to 90% or below .  Reviewed isolation measures ,  Length of isolation a minimum of 7 days and a max of 14 depending on progression of recovery . Earliest return to work is Nov 4th.

## 2018-11-02 NOTE — Progress Notes (Signed)
Virtual Visit via [email or web address]  This visit type was conducted due to national recommendations for restrictions regarding the COVID-19 pandemic (e.g. social distancing).  This format is felt to be most appropriate for this patient at this time.  All issues noted in this document were discussed and addressed.  No physical exam was performed (except for noted visual exam findings with Video Visits).   I connected with@ on 11/02/18 at  8:00 AM EDT by a video enabled telemedicine application and verified that I am speaking with the correct person using two identifiers. Location patient: home Location provider: work or home office Persons participating in the virtual visit: patient, provider  I discussed the limitations, risks, security and privacy concerns of performing an evaluation and management service by telephone and the availability of in person appointments. I also discussed with the patient that there may be a patient responsible charge related to this service. The patient expressed understanding and agreed to proceed.  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  Patient was retested during admission and was postive for COVID  ON oCT 23.  Patient also had contact with a fellow charge RN , Laneta Simmers who was infected with COVID 19 . Patient developed symptoms of sore throat,  Cough and low grade temp on the evenig of Oct 27 after receiving flu vaccine that day.  Her COVID test from Oct 27 is still pending.  She has been placed on leave.  She has shortness of breath with minimal exertion,  A dry cough that is intermittent during the day but disrupted her sleep last night frequently, and bilateral pleurisy.   ROS: See pertinent positives and negatives per HPI.  Past Medical History:  Diagnosis Date  .  Frequent headaches   . Heart murmur   . Migraine   . Preeclampsia   . Psoriasis     Past Surgical History:  Procedure Laterality Date  . CESAREAN SECTION  2015  . CHOLECYSTECTOMY  2015    Family History  Problem Relation Age of Onset  . Arthritis Mother   . Anxiety disorder Mother   . Arthritis Father   . Hyperlipidemia Father   . Hypertension Father   . Depression Father   . Anxiety disorder Father   . Hodgkin's lymphoma Father   . Heart disease Father   . Endometriosis Sister   . Psoriasis Brother   . Diabetes Paternal Aunt   . Arthritis Maternal Grandmother   . Cancer Maternal Grandmother        ovarian and uterine cancer, breast cancer  . Heart disease Maternal Grandmother   . Stroke Maternal Grandmother   . Hypertension Maternal Grandmother   . Cancer Paternal Grandmother        colon cancer  . Alcohol abuse Paternal Grandfather     SOCIAL HX:  reports that she has never smoked. She has never used smokeless tobacco. She reports that she does not drink alcohol or use drugs.   Current Outpatient Medications:  .  Adapalene 0.3 % gel, , Disp: , Rfl: 2 .  albuterol (PROVENTIL HFA) 108 (90 Base) MCG/ACT inhaler, Inhale 2 puffs into the lungs every 6 (six) hours as needed for wheezing or shortness of breath., Disp: 1 Inhaler, Rfl: 1 .  ALPRAZolam (  XANAX) 0.25 MG tablet, TAKE 1 TABLET BY MOUTH EACH NIGHT AT BEDTIME AS NEEDED FOR SLEEP, Disp: 30 tablet, Rfl: 5 .  amitriptyline (ELAVIL) 50 MG tablet, Take 1 tablet (50 mg total) by mouth at bedtime., Disp: 90 tablet, Rfl: 0 .  Apremilast (OTEZLA) 30 MG TABS, Take 1 tablet by mouth twice daily as directed, Disp: 60 tablet, Rfl: 4 .  butalbital-acetaminophen-caffeine (FIORICET) 50-325-40 MG tablet, TAKE 1 TO 2 TABLETS BY MOUTH EVERY 6 HOURS AS NEEDED FOR HEADACHE. MAX OF 4 TABLETS DAILY., Disp: 60 tablet, Rfl: 5 .  cholecalciferol (VITAMIN D3) 25 MCG (1000 UT) tablet, Take 4,000 Units by mouth daily., Disp: , Rfl:  .   clobetasol (TEMOVATE) 0.05 % external solution, , Disp: , Rfl: 3 .  fluconazole (DIFLUCAN) 150 MG tablet, Take 1 tablet (150 mg total) by mouth daily., Disp: 2 tablet, Rfl: 0 .  metoprolol succinate (TOPROL-XL) 25 MG 24 hr tablet, Take 1 tablet (25 mg total) by mouth daily., Disp: 90 tablet, Rfl: 3 .  ondansetron (ZOFRAN ODT) 4 MG disintegrating tablet, Take 1 tablet (4 mg total) by mouth every 8 (eight) hours as needed for nausea or vomiting., Disp: 20 tablet, Rfl: 0 .  pimecrolimus (ELIDEL) 1 % cream, , Disp: , Rfl: 3 .  azithromycin (ZITHROMAX) 500 MG tablet, Take 1 tablet (500 mg total) by mouth daily., Disp: 7 tablet, Rfl: 0 .  benzonatate (TESSALON) 200 MG capsule, Take 1 capsule (200 mg total) by mouth 3 (three) times daily as needed for cough., Disp: 60 capsule, Rfl: 1 .  chlorpheniramine-HYDROcodone (TUSSIONEX PENNKINETIC ER) 10-8 MG/5ML SUER, Take 5 mLs by mouth at bedtime as needed., Disp: 140 mL, Rfl: 0 .  meclizine (ANTIVERT) 25 MG tablet, Take 1 tablet (25 mg total) by mouth 3 (three) times daily as needed for dizziness. (Patient not taking: Reported on 11/02/2018), Disp: 30 tablet, Rfl: 0 .  predniSONE (DELTASONE) 10 MG tablet, 6 tablets daily for 3 days,  then reduce by 1 tablet daily until gone, Disp: 33 tablet, Rfl: 0  EXAM:  VITALS per patient if applicable:  GENERAL: alert, oriented, appears well and in no acute distress  HEENT: atraumatic, conjunttiva clear, no obvious abnormalities on inspection of external nose and ears  NECK: normal movements of the head and neck  LUNGS: on inspection her breathing rate is increased and she is using accessory muscles,  But she is not gasping or wheezing and she is speaking in full sentences .  She just finished showering  CV: no obvious cyanosis  MS: moves all visible extremities without noticeable abnormality  PSYCH/NEURO: pleasant and cooperative, no obvious depression or anxiety, speech and thought processing grossly  intact  ASSESSMENT AND PLAN:  Discussed the following assessment and plan:  Bronchitis due to COVID-19 virus  Bronchitis due to COVID-19 virus Suspected, with test still pending  Will presume to be positive based on history . 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 Prednisone,  Azithromycin,  Cough suppressants.  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  Advised to go to ER if it falls to 90% or below .  Reviewed isolation measures ,  Length of isolation a minimum of 7 days and a max of 14 depending on progression of recovery . Earliest return to work is  Nov 4th.     I discussed the assessment and treatment plan with the patient. The patient was provided an opportunity to ask questions and all were answered. The patient agreed with the plan and demonstrated an understanding of the instructions.   The patient was advised to call back or seek an in-person evaluation if the symptoms worsen or if the condition fails to improve as anticipated.   I provided  25 minutes of non-face-to-face time during this encounter reviewing patient's current problems ,   Providing counseling on the above mentioned problems , and coordination  of care . Crecencio Mc, MD

## 2018-11-09 ENCOUNTER — Other Ambulatory Visit: Payer: Self-pay | Admitting: Physician Assistant

## 2018-11-09 DIAGNOSIS — J40 Bronchitis, not specified as acute or chronic: Secondary | ICD-10-CM

## 2018-11-09 NOTE — Telephone Encounter (Signed)
Requested medication (s) are due for refill today: yes  Requested medication (s) are on the active medication list: yes  Last refill:  03/21/2018  Future visit scheduled: yes  Notes to clinic: review for refill Last filled by different provider   Requested Prescriptions  Pending Prescriptions Disp Refills   VENTOLIN HFA 108 (90 Base) MCG/ACT inhaler [Pharmacy Med Name: VENTOLIN HFA 90 MCG INHALER 108 (90 BAS Aerosol] 18 g 1    Sig: INHALE 2 PUFFS INTO THE LUNGS EVERY 6 (SIX) HOURS AS NEEDED FOR WHEEZING OR SHORTNESS OF BREATH.     Pulmonology:  Beta Agonists Failed - 11/09/2018  7:33 AM      Failed - One inhaler should last at least one month. If the patient is requesting refills earlier, contact the patient to check for uncontrolled symptoms.      Passed - Valid encounter within last 12 months    Recent Outpatient Visits          1 week ago Bronchitis due to COVID-19 virus   [facility] Crecencio Mc, MD   1 month ago Vitamin D deficiency   [facility] Crecencio Mc, MD   10 months ago [place], [place]   1 year ago Screening for cervical cancer   [facility] Crecencio Mc, MD   2 years ago Headache, chronic daily   [facility], [place], MD      Future Appointments            In 4 months Derrel Nip, Aris Everts, MD [facility], [facility]

## 2018-11-13 DIAGNOSIS — J40 Bronchitis, not specified as acute or chronic: Secondary | ICD-10-CM

## 2018-11-13 MED ORDER — ALBUTEROL SULFATE HFA 108 (90 BASE) MCG/ACT IN AERS: 2.0000 | INHALATION_SPRAY | Freq: Four times a day (QID) | RESPIRATORY_TRACT | 1 refills | Status: DC | PRN

## 2018-11-13 NOTE — Telephone Encounter (Signed)
Albuterol filled by historical provider so refill request was not sen to this office Ok to fill ?

## 2018-11-14 ENCOUNTER — Telehealth: Payer: Self-pay | Admitting: Internal Medicine

## 2018-11-14 NOTE — Telephone Encounter (Signed)
Pharmacy called and is requesting to have generic brand of albuterol sent in for pt. Please advise.    [place], [address] [place] [address]  Phone: [phone] Fax: [phone]  Not a 24 hour pharmacy; exact hours not known.

## 2018-11-14 NOTE — Telephone Encounter (Signed)
Called pharmacy and gave verbal to change Rx to generic brand.

## 2018-12-10 ENCOUNTER — Other Ambulatory Visit: Payer: Self-pay | Admitting: Internal Medicine

## 2018-12-10 MED ORDER — FLUCONAZOLE 150 MG PO TABS: 150.0000 mg | ORAL_TABLET | Freq: Every day | ORAL | 1 refills | Status: DC

## 2019-01-10 ENCOUNTER — Other Ambulatory Visit: Payer: Self-pay

## 2019-01-11 MED ORDER — ONDANSETRON 4 MG PO TBDP: 4.0000 mg | ORAL_TABLET | Freq: Three times a day (TID) | ORAL | 0 refills | Status: DC | PRN

## 2019-01-30 MED FILL — OTEZLA 30 MG TABS: 30 | 30 days supply | Qty: 60 | Fill #1

## 2019-03-29 ENCOUNTER — Ambulatory Visit: Payer: No Typology Code available for payment source | Admitting: Internal Medicine

## 2019-04-01 ENCOUNTER — Ambulatory Visit: Payer: No Typology Code available for payment source | Admitting: Internal Medicine

## 2019-04-08 ENCOUNTER — Other Ambulatory Visit: Payer: Self-pay | Admitting: Internal Medicine

## 2019-04-08 NOTE — Telephone Encounter (Signed)
Refill request for xanax and amtriptyline, last seen 11-02-18, last filled 09-28-18.  Please advise.

## 2019-04-15 ENCOUNTER — Other Ambulatory Visit: Payer: Self-pay

## 2019-04-17 ENCOUNTER — Encounter: Payer: Self-pay | Admitting: Internal Medicine

## 2019-04-17 ENCOUNTER — Ambulatory Visit (INDEPENDENT_AMBULATORY_CARE_PROVIDER_SITE_OTHER): Payer: No Typology Code available for payment source

## 2019-04-17 ENCOUNTER — Other Ambulatory Visit: Payer: Self-pay

## 2019-04-17 ENCOUNTER — Ambulatory Visit (INDEPENDENT_AMBULATORY_CARE_PROVIDER_SITE_OTHER): Payer: No Typology Code available for payment source | Admitting: Internal Medicine

## 2019-04-17 VITALS — BP 120/66 | HR 113 | Temp 98.1°F | Resp 14 | Ht 62.0 in | Wt 156.6 lb

## 2019-04-17 DIAGNOSIS — K8020 Calculus of gallbladder without cholecystitis without obstruction: Secondary | ICD-10-CM

## 2019-04-17 DIAGNOSIS — H01002 Unspecified blepharitis right lower eyelid: Secondary | ICD-10-CM | POA: Diagnosis not present

## 2019-04-17 DIAGNOSIS — G5603 Carpal tunnel syndrome, bilateral upper limbs: Secondary | ICD-10-CM

## 2019-04-17 DIAGNOSIS — R0602 Shortness of breath: Secondary | ICD-10-CM | POA: Diagnosis not present

## 2019-04-17 DIAGNOSIS — H01003 Unspecified blepharitis right eye, unspecified eyelid: Secondary | ICD-10-CM | POA: Insufficient documentation

## 2019-04-17 DIAGNOSIS — Z79899 Other long term (current) drug therapy: Secondary | ICD-10-CM | POA: Diagnosis not present

## 2019-04-17 DIAGNOSIS — D509 Iron deficiency anemia, unspecified: Secondary | ICD-10-CM

## 2019-04-17 DIAGNOSIS — U071 COVID-19: Secondary | ICD-10-CM

## 2019-04-17 DIAGNOSIS — R233 Spontaneous ecchymoses: Secondary | ICD-10-CM

## 2019-04-17 DIAGNOSIS — R238 Other skin changes: Secondary | ICD-10-CM

## 2019-04-17 DIAGNOSIS — G5600 Carpal tunnel syndrome, unspecified upper limb: Secondary | ICD-10-CM | POA: Insufficient documentation

## 2019-04-17 DIAGNOSIS — R519 Headache, unspecified: Secondary | ICD-10-CM

## 2019-04-17 DIAGNOSIS — J40 Bronchitis, not specified as acute or chronic: Secondary | ICD-10-CM

## 2019-04-17 DIAGNOSIS — F5102 Adjustment insomnia: Secondary | ICD-10-CM

## 2019-04-17 DIAGNOSIS — Z9049 Acquired absence of other specified parts of digestive tract: Secondary | ICD-10-CM

## 2019-04-17 LAB — COMPREHENSIVE METABOLIC PANEL
ALT: 29 U/L (ref 0–35)
AST: 24 U/L (ref 0–37)
Albumin: 4.7 g/dL (ref 3.5–5.2)
Alkaline Phosphatase: 76 U/L (ref 39–117)
BUN: 8 mg/dL (ref 6–23)
CO2: 29 mEq/L (ref 19–32)
Calcium: 9.6 mg/dL (ref 8.4–10.5)
Chloride: 102 mEq/L (ref 96–112)
Creatinine, Ser: 0.82 mg/dL (ref 0.40–1.20)
GFR: 81.05 mL/min (ref >60.00)
Glucose, Bld: 103 mg/dL — ABNORMAL HIGH (ref 70–99)
Potassium: 4.3 mEq/L (ref 3.5–5.1)
Sodium: 138 mEq/L (ref 135–145)
Total Bilirubin: 0.5 mg/dL (ref 0.2–1.2)
Total Protein: 7 g/dL (ref 6.0–8.3)

## 2019-04-17 LAB — CBC WITH DIFFERENTIAL/PLATELET
Basophils Absolute: 0 10*3/uL (ref 0.0–0.1)
Basophils Relative: 0.7 % (ref 0.0–3.0)
Eosinophils Absolute: 0.1 10*3/uL (ref 0.0–0.7)
Eosinophils Relative: 2.3 % (ref 0.0–5.0)
HCT: 41.1 % (ref 36.0–46.0)
Hemoglobin: 13.6 g/dL (ref 12.0–15.0)
Lymphocytes Relative: 32.6 % (ref 12.0–46.0)
Lymphs Abs: 1.9 10*3/uL (ref 0.7–4.0)
MCHC: 33 g/dL (ref 30.0–36.0)
MCV: 88.5 fl (ref 78.0–100.0)
Monocytes Absolute: 0.3 10*3/uL (ref 0.1–1.0)
Monocytes Relative: 5.1 % (ref 3.0–12.0)
Neutro Abs: 3.5 10*3/uL (ref 1.4–7.7)
Neutrophils Relative %: 59.3 % (ref 43.0–77.0)
Platelets: 309 10*3/uL (ref 150.0–400.0)
RBC: 4.64 Mil/uL (ref 3.87–5.11)
RDW: 14.1 % (ref 11.5–15.5)
WBC: 5.9 10*3/uL (ref 4.0–10.5)

## 2019-04-17 MED ORDER — FLUTICASONE-SALMETEROL 100-50 MCG/DOSE IN AEPB: 1.0000 | INHALATION_SPRAY | Freq: Two times a day (BID) | RESPIRATORY_TRACT | 0 refills | Status: DC

## 2019-04-17 MED ORDER — TOBRAMYCIN 0.3 % OP SOLN: 1.0000 [drp] | OPHTHALMIC | 0 refills | Status: DC

## 2019-04-17 NOTE — Patient Instructions (Signed)
Tobramycin eye drops for right eye  Referral to [facility] in process  Trial of advair twice daily for shortness of breath

## 2019-04-17 NOTE — Progress Notes (Signed)
Subjective:  Patient ID: Lori Douglas, female    DOB: 02-21-1985  Age: 34 y.o. MRN: [id number]  CC: The primary encounter diagnosis was Shortness of breath. Diagnoses of Iron deficiency anemia, unspecified iron deficiency anemia type, Long-term use of high-risk medication, Blepharitis of right lower eyelid, unspecified type, Bilateral carpal tunnel syndrome, Insomnia due to psychological stress, Headache, chronic daily, Gallstones, S/P cholecystectomy, Easy bruising, and Bronchitis due to COVID-19 virus were also pertinent to this visit.  HPI Lori Douglas presents for 6 month follow up on chronic conditions including bronchitis , headache disorder and GAD.Marland Kitchen  This visit occurred during the SARS-CoV-2 public health emergency.  Safety protocols were in place, including screening questions prior to the visit, additional usage of staff PPE, and extensive cleaning of exam room while observing appropriate contact time as indicated for disinfecting solutions.    Patient has received both doses of the Pfizer COVID 19 vaccine without complications.  Patient continues to mask when outside of the home except when walking in yard or at safe distances from others .  Patient denies any change in mood or development of unhealthy behaviors resuting from the pandemic's restriction of activities and socialization.     GAD  Her GAD is triggered by son Lori Douglas's health conditions and father's health.  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 As a result, patient's anxiety much better   And her son's headaches and demeanor have improved.   Father Lori Douglas) has been feeling better,  Taking better  care of himself  And has come to terms with some of his past , which has resulted in improved compliance with his own health issues,  Which in turn is helping Lori Douglas  relax    1) right eye eyelid blepharitis despite using warm compresses , today is Day 3   No other symptoms. No recent viral syndrome,.  Needs abx   2) needs referral to [facility] for CTS bilateral  3) persistent shortness of breath since presumed but not confirmed COVID infection in October.  Using albuterol MDI more frequently since then,  Using it several times per week .  No recent chest x ray   Outpatient Medications Prior to Visit  Medication Sig Dispense Refill  . Adapalene 0.3 % gel   2  . albuterol (PROVENTIL HFA) 108 (90 Base) MCG/ACT inhaler Inhale 2 puffs into the lungs every 6 (six) hours as needed for wheezing or shortness of breath. 18 g 1  . ALPRAZolam (XANAX) 0.25 MG tablet TAKE 1 TABLET BY MOUTH EACH NIGHT AT BEDTIME AS NEEDED FOR SLEEP 30 tablet 5  . amitriptyline (ELAVIL) 50 MG tablet TAKE 1 TABLET BY MOUTH AT BEDTIME. 90 tablet 1  . Apremilast (OTEZLA) 30 MG TABS Take 1 tablet by mouth twice daily as directed 60 tablet 4  . butalbital-acetaminophen-caffeine (FIORICET) 50-325-40 MG tablet TAKE 1 TO 2 TABLETS BY MOUTH EVERY 6 HOURS AS NEEDED FOR HEADACHE. MAX OF 4 TABLETS DAILY. 60 tablet 5  . cholecalciferol (VITAMIN D3) 25 MCG (1000 UT) tablet Take 4,000 Units by mouth daily.    . clobetasol (TEMOVATE) 0.05 % external solution   3  . metoprolol succinate (TOPROL-XL) 25 MG 24 hr tablet Take 1 tablet (25 mg total) by mouth daily. 90 tablet 3  . ondansetron (ZOFRAN ODT) 4 MG disintegrating tablet Take 1 tablet (4  mg total) by mouth every 8 (eight) hours as needed for nausea or vomiting. 20 tablet 0  . pimecrolimus (ELIDEL) 1 % cream   3  . VENTOLIN HFA 108 (90 Base) MCG/ACT inhaler INHALE 2 PUFFS INTO THE LUNGS EVERY 6 (SIX) HOURS AS NEEDED FOR WHEEZING OR SHORTNESS OF BREATH. 18 g 1  . fluconazole (DIFLUCAN) 150 MG tablet Take 1 tablet (150 mg total) by mouth daily. For 2 days as needed for vaginitis (Patient not taking: Reported on 04/17/2019) 2 tablet 1  . meclizine (ANTIVERT)  25 MG tablet Take 1 tablet (25 mg total) by mouth 3 (three) times daily as needed for dizziness. (Patient not taking: Reported on 11/02/2018) 30 tablet 0  . azithromycin (ZITHROMAX) 500 MG tablet Take 1 tablet (500 mg total) by mouth daily. (Patient not taking: Reported on 04/17/2019) 7 tablet 0  . benzonatate (TESSALON) 200 MG capsule Take 1 capsule (200 mg total) by mouth 3 (three) times daily as needed for cough. (Patient not taking: Reported on 04/17/2019) 60 capsule 1  . chlorpheniramine-HYDROcodone (TUSSIONEX PENNKINETIC ER) 10-8 MG/5ML SUER Take 5 mLs by mouth at bedtime as needed. (Patient not taking: Reported on 04/17/2019) 140 mL 0  . predniSONE (DELTASONE) 10 MG tablet 6 tablets daily for 3 days,  then reduce by 1 tablet daily until gone (Patient not taking: Reported on 04/17/2019) 33 tablet 0   No facility-administered medications prior to visit.    Review of Systems;  Patient denies headache, fevers, malaise, unintentional weight loss, skin rash, eye pain, sinus congestion and sinus pain, sore throat, dysphagia,  hemoptysis , cough,  chest pain, palpitations, orthopnea, edema, abdominal pain, nausea, melena, diarrhea, constipation, flank pain, dysuria, hematuria, urinary  Frequency, nocturia, numbness, tingling, seizures,  Focal weakness, Loss of consciousness,  Tremor, insomnia, depression,  and suicidal ideation.      Objective:  BP 120/66 (BP Location: Left Arm, Patient Position: Sitting, Cuff Size: Normal)   Pulse (!) 113   Temp 98.1 F (36.7 C) (Temporal)   Resp 14   Ht 5\' 2"  (1.575 m)   Wt 156 lb 9.6 oz (71 kg)   SpO2 99%   BMI 28.64 kg/m   BP Readings from Last 3 Encounters:  04/17/19 120/66  09/28/18 110/78  08/10/18 114/80    Wt Readings from Last 3 Encounters:  04/17/19 156 lb 9.6 oz (71 kg)  09/28/18 156 lb 12.8 oz (71.1 kg)  08/10/18 155 lb 12.8 oz (70.7 kg)    General appearance: alert, cooperative and appears stated age Ears: normal TM's and external ear  canals both ears Throat: lips, mucosa, and tongue normal; teeth and gums normal Neck: no adenopathy, no carotid bruit, supple, symmetrical, trachea midline and thyroid not enlarged, symmetric, no tenderness/mass/nodules Back: symmetric, no curvature. ROM normal. No CVA tenderness. Lungs: clear to auscultation bilaterally Heart: regular rate and rhythm, S1, S2 normal, no murmur, click, rub or gallop Abdomen: soft, non-tender; bowel sounds normal; no masses,  no organomegaly Pulses: 2+ and symmetric Skin: Skin color, texture, turgor normal. No rashes or lesions Lymph nodes: Cervical, supraclavicular, and axillary nodes normal.  No results found for: HGBA1C  Lab Results  Component Value Date   CREATININE 0.82 04/17/2019   CREATININE 0.78 09/28/2018   CREATININE 0.88 01/04/2018    Lab Results  Component Value Date   WBC 5.9 04/17/2019   HGB 13.6 04/17/2019   HCT 41.1 04/17/2019   PLT 309.0 04/17/2019   GLUCOSE 103 (H) 04/17/2019   CHOL 103  07/10/2017   TRIG 162.0 (H) 07/10/2017   HDL 49.60 07/10/2017   LDLCALC 21 07/10/2017   ALT 29 04/17/2019   AST 24 04/17/2019   NA 138 04/17/2019   K 4.3 04/17/2019   CL 102 04/17/2019   CREATININE 0.82 04/17/2019   BUN 8 04/17/2019   CO2 29 04/17/2019   TSH 3.18 01/04/2018   INR 0.85 01/15/2018    No results found.  Assessment & Plan:   Problem List Items Addressed This Visit      Unprioritized   Insomnia due to psychological stress    Managed with elavil  and prn use of alprazolam . Refills given       Headache, chronic daily    Managing migraines with fioricet and tylenol .less frequent occurrence with improvement in stressors.       Easy bruising    Reviewed Hematology workup , which was negative      Bronchitis due to COVID-19 virus    Persistent dyspnea since presumed infection.  Chest x ray done today is normal and sats are normal.   trial of Advair. If no improvement will need workup to include PFTS/pulmonary  consult /ECHO      Blepharitis of eyelid of right eye    rx tobramycin for use if inflammation does not resolve      CTS (carpal tunnel syndrome)   Relevant Orders   Ambulatory referral to Orthopedic Surgery   S/P cholecystectomy   RESOLVED: Gallstones    S/p cholecystectomy        Other Visit Diagnoses    Shortness of breath    -  Primary   Relevant Orders   DG Chest 2 View (Completed)   Iron deficiency anemia, unspecified iron deficiency anemia type       Relevant Orders   CBC with Differential/Platelet (Completed)   Iron, TIBC and Ferritin Panel (Completed)   Long-term use of high-risk medication       Relevant Orders   Comprehensive metabolic panel (Completed)      I have discontinued Syleena N. Trice's predniSONE, azithromycin, benzonatate, and chlorpheniramine-HYDROcodone. I am also having her start on Fluticasone-Salmeterol and tobramycin. Additionally, I am having her maintain her cholecalciferol, Adapalene, clobetasol, pimecrolimus, Otezla, metoprolol succinate, butalbital-acetaminophen-caffeine, meclizine, Ventolin HFA, albuterol, fluconazole, ondansetron, ALPRAZolam, and amitriptyline.  Meds ordered this encounter  Medications  . Fluticasone-Salmeterol (ADVAIR DISKUS) 100-50 MCG/DOSE AEPB    Sig: Inhale 1 puff into the lungs 2 (two) times daily.    Dispense:  60 each    Refill:  0  . tobramycin (TOBREX) 0.3 % ophthalmic solution    Sig: Place 1 drop into the right eye every 4 (four) hours.    Dispense:  5 mL    Refill:  0    Medications Discontinued During This Encounter  Medication Reason  . azithromycin (ZITHROMAX) 500 MG tablet Completed Course  . benzonatate (TESSALON) 200 MG capsule Completed Course  . chlorpheniramine-HYDROcodone (TUSSIONEX PENNKINETIC ER) 10-8 MG/5ML SUER Completed Course  . predniSONE (DELTASONE) 10 MG tablet Completed Course    I provided  30 minutes of  face-to-face time during this encounter reviewing patient's current  problems and past surgeries, labs and imaging studies, providing counseling on the above mentioned problems , and coordination  of care .   Follow-up: No follow-ups on file.   Sherlene Shams, MD

## 2019-04-18 DIAGNOSIS — Z9049 Acquired absence of other specified parts of digestive tract: Secondary | ICD-10-CM | POA: Insufficient documentation

## 2019-04-18 LAB — IRON,TIBC AND FERRITIN PANEL
%SAT: 11 % (calc) — ABNORMAL LOW (ref 16–45)
Ferritin: 16 ng/mL (ref 16–154)
Iron: 48 ug/dL (ref 40–190)
TIBC: 428 mcg/dL (calc) (ref 250–450)

## 2019-04-18 NOTE — Assessment & Plan Note (Signed)
Managing migraines with fioricet and tylenol .less frequent occurrence with improvement in stressors.

## 2019-04-18 NOTE — Assessment & Plan Note (Addendum)
Managed with elavil  and prn use of alprazolam . Refills given

## 2019-04-18 NOTE — Assessment & Plan Note (Signed)
Persistent dyspnea since presumed infection.  Chest x ray done today is normal and sats are normal.   trial of Advair. If no improvement will need workup to include PFTS/pulmonary consult /ECHO

## 2019-04-18 NOTE — Assessment & Plan Note (Signed)
S/p cholecystectomy

## 2019-04-18 NOTE — Assessment & Plan Note (Signed)
rx tobramycin for use if inflammation does not resolve

## 2019-04-18 NOTE — Assessment & Plan Note (Signed)
Reviewed Hematology workup , which was negative

## 2019-05-20 MED FILL — OTEZLA 30 MG TABS: 30 | 30 days supply | Qty: 60 | Fill #2

## 2019-06-04 MED ORDER — DILTIAZEM HCL ER COATED BEADS 240 MG PO TB24: 240.0000 mg | ORAL_TABLET | Freq: Every day | ORAL | 2 refills | Status: DC

## 2019-06-20 ENCOUNTER — Telehealth: Payer: No Typology Code available for payment source | Admitting: Family

## 2019-06-20 DIAGNOSIS — B370 Candidal stomatitis: Secondary | ICD-10-CM | POA: Diagnosis not present

## 2019-06-20 MED ORDER — FLUCONAZOLE 150 MG PO TABS
150.0000 mg | ORAL_TABLET | Freq: Once | ORAL | 0 refills | Status: AC
Start: 2019-06-20 — End: 2019-06-20

## 2019-06-20 NOTE — Telephone Encounter (Signed)
Spoke with pt's husband and he stated that pt had an evisit today and he is picking up her medication this evening.

## 2019-06-20 NOTE — Progress Notes (Signed)
We are sorry that you are not feeling well.  Here is how we plan to help!  Your symptoms indicate a likely fungal infection (Pharyngitis) or Thrush.   Pharyngitis is inflammation in the back of the throat which can cause a sore throat, scratchiness and sometimes difficulty swallowing.   Pharyngitis is typically caused by a respiratory virus and will just run its course.    For throat pain, we recommend over the counter oral pain relief medications such as acetaminophen or aspirin, or anti-inflammatory medications such as ibuprofen or naproxen sodium.   I have sent in a medication to help: Diflucan 150 mg x 1 dose  Home Care:  Only take medications as instructed by your medical team.  Do not drink alcohol while taking these medications.  Avoid close contacts especially the very young and the elderly.  Cover your mouth when you cough or sneeze.  Always remember to wash your hands.  Get Help Right Away If:  You develop worsening fever or throat pain.  You develop a severe head ache or visual changes.  Your symptoms persist after you have completed your treatment plan.  Make sure you  Understand these instructions.  Will watch your condition.  Will get help right away if you are not doing well or get worse.  Your e-visit answers were reviewed by a board certified advanced clinical practitioner to complete your personal care plan.  Depending on the condition, your plan could have included both over the counter or prescription medications.  If there is a problem please reply  once you have received a response from your provider.  Your safety is important to [place].  If you have drug allergies check your prescription carefully.    You can use MyChart to ask questions about todays visit, request a non-urgent call back, or ask for a work or school excuse for 24 hours related to this e-Visit. If it has been greater than 24 hours you will need to follow up with your provider, or enter a new  e-Visit to address those concerns.  You will get an e-mail in the next two days asking about your experience.  I hope that your e-visit has been valuable and will speed your recovery. Thank you for using e-visits.

## 2019-06-20 NOTE — Progress Notes (Signed)
duplicate

## 2019-06-21 ENCOUNTER — Telehealth: Payer: No Typology Code available for payment source | Admitting: Family Medicine

## 2019-06-26 NOTE — Progress Notes (Signed)
Greater than 5 minutes, yet less than 10 minutes of time have been spent researching, coordinating, and implementing care for this patient today.  Thank you for the details you included in the comment boxes. Those details are very helpful in determining the best course of treatment for you and help us to provide the best care.  

## 2019-08-21 ENCOUNTER — Telehealth: Payer: No Typology Code available for payment source | Admitting: Family

## 2019-08-21 DIAGNOSIS — U071 COVID-19: Secondary | ICD-10-CM

## 2019-08-21 MED ORDER — PREDNISONE 10 MG (21) PO TBPK: ORAL_TABLET | ORAL | 0 refills | Status: DC

## 2019-08-21 MED ORDER — BENZONATATE 100 MG PO CAPS: 100.0000 mg | ORAL_CAPSULE | Freq: Three times a day (TID) | ORAL | 0 refills | Status: DC | PRN

## 2019-08-21 MED ORDER — ALBUTEROL SULFATE HFA 108 (90 BASE) MCG/ACT IN AERS: 2.0000 | INHALATION_SPRAY | Freq: Four times a day (QID) | RESPIRATORY_TRACT | 0 refills | Status: DC | PRN

## 2019-08-21 NOTE — Progress Notes (Signed)
We are sorry you are not feeling well. We are here to help!  You have tested positive for COVID-19, meaning that you were infected with the novel coronavirus and could give the germ to others.    You have been enrolled in MyChart Home Monitoring for COVID-19. Daily you will receive a questionnaire within the MyChart website. Our COVID-19 response team will be monitoring your responses daily.  Please continue isolation at home, for at least 10 days since the start of your symptoms and until you have had 24 hours with no fever (without taking a fever reducer) and with improving of symptoms.  Please continue good preventive care measures, including:  frequent hand-washing, avoid touching your face, cover coughs/sneezes, stay out of crowds and keep a 6 foot distance from others.  Follow up with your provider or go to the nearest hospital ED for re-assessment if fever/cough/breathlessness return.  The following symptoms may appear 2-14 days after exposure:  Fever  Cough  Shortness of breath or difficulty breathing  Chills  Repeated shaking with chills  Muscle pain  Headache  Sore throat  New loss of taste or smell  Fatigue  Congestion or runny nose  Nausea or vomiting  Diarrhea  Go to the nearest hospital ED for assessment if fever/cough/breathlessness are severe or illness seems like a threat to life.  It is vitally important that if you feel that you have an infection such as this virus or any other virus that you stay home and away from places where you may spread it to others.  You should avoid contact with people age 74 and older.   You can use medication such as A prescription cough medication called Tessalon Perles 100 mg. You may take 1-2 capsules every 8 hours as needed for cough and A prescription inhaler called Albuterol MDI 90 mcg /actuation 2 puffs every 4 hours as needed for shortness of breath, wheezing, cough, and a prednisone dose pack.   You may also take  acetaminophen (Tylenol) as needed for fever.  Reduce your risk of any infection by using the same precautions used for avoiding the common cold or flu:   Wash your hands often with soap and warm water for at least 20 seconds.  If soap and water are not readily available, use an alcohol-based hand sanitizer with at least 60% alcohol.   If coughing or sneezing, cover your mouth and nose by coughing or sneezing into the elbow areas of your shirt or coat, into a tissue or into your sleeve (not your hands).  Avoid shaking hands with others and consider head nods or verbal greetings only.  Avoid touching your eyes, nose, or mouth with unwashed hands.   Avoid close contact with people who are sick.  Avoid places or events with large numbers of people in one location, like concerts or sporting events.  Carefully consider travel plans you have or are making.  If you are planning any travel outside or inside the [place], visit the CDC's Travelers' Health webpage for the latest health notices.  If you have some symptoms but not all symptoms, continue to monitor at home and seek medical attention if your symptoms worsen.  If you are having a medical emergency, call 911.  HOME CARE  Only take medications as instructed by your medical team.  Drink plenty of fluids and get plenty of rest.  A steam or ultrasonic humidifier can help if you have congestion.   GET HELP RIGHT AWAY IF YOU HAVE  EMERGENCY WARNING SIGNS** FOR COVID-19. If you or someone is showing any of these signs seek emergency medical care immediately. Call 911 or proceed to your closest emergency facility if:  You develop worsening high fever.  Trouble breathing  Bluish lips or face  Persistent pain or pressure in the chest  New confusion  Inability to wake or stay awake  You cough up blood.  Your symptoms become more severe  **This list is not all possible symptoms. Contact your medical provider for any symptoms that are  sever or concerning to you.  MAKE SURE YOU   Understand these instructions.  Will watch your condition.  Will get help right away if you are not doing well or get worse.  Your e-visit answers were reviewed by a board certified advanced clinical practitioner to complete your personal care plan.  Depending on the condition, your plan could have included both over the counter or prescription medications.  If there is a problem please reply once you have received a response from your provider.  Your safety is important to [place].  If you have drug allergies check your prescription carefully.    You can use MyChart to ask questions about today's visit, request a non-urgent call back, or ask for a work or school excuse for 24 hours related to this e-Visit. If it has been greater than 24 hours you will need to follow up with your provider, or enter a new e-Visit to address those concerns. You will get an e-mail in the next two days asking about your experience.  I hope that your e-visit has been valuable and will speed your recovery. Thank you for using e-visits.  Approximately 5 minutes was spent documenting and reviewing patient's chart.

## 2019-10-14 ENCOUNTER — Other Ambulatory Visit: Payer: Self-pay | Admitting: Internal Medicine

## 2019-10-23 ENCOUNTER — Other Ambulatory Visit: Payer: Self-pay | Admitting: Internal Medicine

## 2019-10-25 ENCOUNTER — Other Ambulatory Visit: Payer: Self-pay | Admitting: Internal Medicine

## 2019-10-25 NOTE — Telephone Encounter (Signed)
Last OV 04/17/19 and refill 07/22/19 ok to fill?

## 2019-11-04 ENCOUNTER — Other Ambulatory Visit: Payer: Self-pay | Admitting: Internal Medicine

## 2019-11-08 ENCOUNTER — Other Ambulatory Visit: Payer: Self-pay

## 2019-12-11 ENCOUNTER — Other Ambulatory Visit: Payer: Self-pay | Admitting: Family

## 2019-12-11 ENCOUNTER — Telehealth: Payer: No Typology Code available for payment source | Admitting: Family

## 2019-12-11 DIAGNOSIS — J019 Acute sinusitis, unspecified: Secondary | ICD-10-CM

## 2019-12-11 MED ORDER — DOXYCYCLINE HYCLATE 100 MG PO TABS
100.0000 mg | ORAL_TABLET | Freq: Two times a day (BID) | ORAL | 0 refills | Status: DC
Start: 2019-12-11 — End: 2019-12-17

## 2019-12-11 NOTE — Progress Notes (Signed)

## 2019-12-12 ENCOUNTER — Other Ambulatory Visit: Payer: Self-pay | Admitting: Physician Assistant

## 2019-12-12 MED ORDER — PREDNISONE 5 MG PO TABS: ORAL_TABLET | ORAL | 0 refills | Status: DC

## 2019-12-12 MED ORDER — AZITHROMYCIN 250 MG PO TABS
ORAL_TABLET | ORAL | 0 refills | Status: DC
Start: 2019-12-12 — End: 2019-12-17

## 2019-12-12 NOTE — Addendum Note (Signed)
Addended by: Sebastian Ache on: 12/12/2019 02:53 PM   Modules accepted: Orders

## 2019-12-17 ENCOUNTER — Other Ambulatory Visit: Payer: Self-pay

## 2019-12-17 ENCOUNTER — Encounter: Payer: Self-pay | Admitting: Emergency Medicine

## 2019-12-17 ENCOUNTER — Ambulatory Visit
Admission: EM | Admit: 2019-12-17 | Discharge: 2019-12-17 | Disposition: A | Discharge status: Home / Self Care | Payer: No Typology Code available for payment source | Attending: Emergency Medicine | Admitting: Emergency Medicine

## 2019-12-17 DIAGNOSIS — J09X2 Influenza due to identified novel influenza A virus with other respiratory manifestations: Secondary | ICD-10-CM

## 2019-12-17 DIAGNOSIS — Z20822 Contact with and (suspected) exposure to covid-19: Secondary | ICD-10-CM | POA: Diagnosis not present

## 2019-12-17 DIAGNOSIS — H66001 Acute suppurative otitis media without spontaneous rupture of ear drum, right ear: Secondary | ICD-10-CM

## 2019-12-17 LAB — RESP PANEL BY RT-PCR (FLU A&B, COVID) ARPGX2
Influenza A by PCR: POSITIVE — AB
Influenza B by PCR: NEGATIVE
SARS Coronavirus 2 by RT PCR: NEGATIVE

## 2019-12-17 MED ORDER — FLUCONAZOLE 200 MG PO TABS: 200.0000 mg | ORAL_TABLET | Freq: Every day | ORAL | 0 refills | Status: AC

## 2019-12-17 MED ORDER — CEFDINIR 300 MG PO CAPS: 300.0000 mg | ORAL_CAPSULE | Freq: Two times a day (BID) | ORAL | 0 refills | Status: AC

## 2019-12-17 MED ORDER — ONDANSETRON 8 MG PO TBDP: 8.0000 mg | ORAL_TABLET | Freq: Three times a day (TID) | ORAL | 0 refills | Status: DC | PRN

## 2019-12-17 MED ORDER — PROMETHAZINE-DM 6.25-15 MG/5ML PO SYRP: 5.0000 mL | ORAL_SOLUTION | Freq: Four times a day (QID) | ORAL | 0 refills | Status: DC | PRN

## 2019-12-17 MED ORDER — BENZONATATE 100 MG PO CAPS: 200.0000 mg | ORAL_CAPSULE | Freq: Three times a day (TID) | ORAL | 0 refills | Status: DC

## 2019-12-17 NOTE — ED Provider Notes (Addendum)
[facility]    CSN: [id number] Arrival date & time: 12/17/19  1613      History   Chief Complaint Chief Complaint  Patient presents with   Cough   Fever   Nasal Congestion    HPI Lori Douglas is a 34 y.o. female.   HPI   34 year old female here for evaluation of cough and nasal congestion.  Patient reports that 2 weeks ago she developed sinus pressure, called and spoke with her primary care provider this past week who initially placed her on doxycycline.  She took 2 doses and had vomiting each time.  She called back to her PCP who prescribed a Z-Pak and steroids.  Patient has finished both courses and she continues to have sinus pressure, throat congestion, runny nose with clear nasal discharge, fever up to 100.5, chills, body aches ear pressure and a dry cough.  Patient reports that yesterday she heard a pop and had a sharp pain in her right ear and has since had some dizziness.  Patient's also had 2 episodes of diarrhea in the past 2 days.  Patient denies shortness of breath or wheezing, changes to taste or smell, nausea, or vomiting.  Patient has had her full code vaccine series but not the booster and also her flu shot.  Patient also had Covid in August.  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      Home Medications    Prior to Admission medications   Medication Sig Start Date End Date Taking? Authorizing Provider  albuterol (PROVENTIL HFA) 108 (90 Base) MCG/ACT inhaler Inhale 2 puffs into the lungs every 6 (six) hours as needed for wheezing or shortness of breath. 11/13/18  Yes Sherlene Shams, MD  albuterol (VENTOLIN HFA) 108 (90 Base) MCG/ACT inhaler Inhale 2 puffs into the lungs every 6 (six) hours as needed for wheezing or shortness of breath. 08/21/19  Yes Hawks, Neysa Bonito A, FNP  ALPRAZolam (XANAX) 0.25 MG tablet TAKE 1 TABLET BY MOUTH EACH NIGHT AT BEDTIME AS NEEDED FOR SLEEP 10/25/19  Yes Sherlene Shams, MD  amitriptyline (ELAVIL) 50 MG tablet TAKE 1 TABLET BY MOUTH AT BEDTIME. 10/25/19  Yes Sherlene Shams, MD  cholecalciferol (VITAMIN D3) 25 MCG (1000 UT) tablet Take 4,000 Units by mouth daily.   Yes [provider]  clobetasol (TEMOVATE) 0.05 % external solution  08/29/17  Yes [provider]  diltiazem (CARDIZEM LA) 240 MG 24 hr tablet TAKE 1 TABLET (240 MG TOTAL) BY MOUTH DAILY. 10/23/19  Yes Sherlene Shams, MD  Adapalene 0.3 % gel  08/31/17   [provider]  Apremilast (OTEZLA) 30 MG TABS Take 1 tablet by mouth twice daily as directed 09/19/18   Quentin AngstJegede, Olugbemiga E, MD  benzonatate (TESSALON) 100 MG capsule Take 2 capsules (200 mg total) by mouth every 8 (eight) hours. 12/17/19   Becky Augustayan, Mikah Rottinghaus, NP  butalbital-acetaminophen-caffeine (FIORICET) 50-325-40 MG tablet TAKE 1 TO 2 TABLETS BY MOUTH EVERY 6 HOURS AS NEEDED FOR HEADACHE. MAX OF 4 TABLETS DAILY. 09/28/18   Sherlene Shamsullo, Teresa L, MD  cefdinir (OMNICEF) 300 MG capsule Take 1 capsule (300 mg total) by mouth 2 (two) times daily for 10 days. 12/17/19 12/27/19  Becky Augustayan, Eldrick Penick, NP   ondansetron (ZOFRAN ODT) 8 MG disintegrating tablet Take 1 tablet (8 mg total) by mouth every 8 (eight) hours as needed for nausea or vomiting. 12/17/19   Becky Augustayan, Terrez Ander, NP  promethazine-dextromethorphan (PROMETHAZINE-DM) 6.25-15 MG/5ML syrup Take 5 mLs by mouth 4 (four) times daily as needed. 12/17/19   Becky Augustayan, Amica Harron, NP  Fluticasone-Salmeterol (ADVAIR DISKUS) 100-50 MCG/DOSE AEPB Inhale 1 puff into the lungs 2 (two) times daily. 04/17/19 12/17/19  Sherlene Shamsullo, Teresa L, MD    Family History Family History  Problem Relation Age of Onset   Arthritis Mother    Anxiety disorder Mother    Arthritis Father    Hyperlipidemia Father    Hypertension Father    Depression Father    Anxiety disorder Father    Hodgkin's lymphoma Father    Heart disease Father    Endometriosis Sister    Psoriasis Brother    Diabetes Paternal Aunt    Arthritis Maternal Grandmother    Cancer Maternal Grandmother        ovarian and uterine cancer, breast cancer   Heart disease Maternal Grandmother    Stroke Maternal Grandmother    Hypertension Maternal Grandmother    Cancer Paternal Grandmother        colon cancer   Alcohol abuse Paternal Grandfather     Social History Social History   Tobacco Use   Smoking status: Never Smoker   Smokeless tobacco: Never Used  Building services engineerVaping Use   Vaping Use: Never used  Substance Use Topics   Alcohol use: No   Drug use: No     Allergies   Biaxin [clarithromycin], Sulfa antibiotics, and Penicillins   Review of Systems Review of Systems  Constitutional: Positive for fatigue and fever. Negative for activity change and appetite change.  HENT: Positive for congestion, ear pain and sinus pressure. Negative for sore throat.   Respiratory: Positive for cough. Negative for shortness of breath and wheezing.   Gastrointestinal: Positive for diarrhea. Negative for abdominal pain, nausea and vomiting.  Musculoskeletal: Positive for arthralgias and myalgias.   Skin: Negative.   Neurological: Positive for dizziness. Negative for syncope.  Hematological: Negative.   Psychiatric/Behavioral: Negative.      Physical Exam Triage Vital Signs ED Triage Vitals  Enc Vitals Group     BP 12/17/19 1709 127/86     Pulse Rate 12/17/19 1709 (!) 116     Resp 12/17/19 1709 18     Temp 12/17/19 1709 99.3 F (37.4 C)     Temp Source 12/17/19 1709 Oral     SpO2 12/17/19 1709 99 %     Weight 12/17/19 1706 150 lb (68 kg)     Height 12/17/19 1706 5\' 3"  (1.6 m)     Head Circumference --      Peak Flow --  Pain Score 12/17/19 1705 3     Pain Loc --      Pain Edu? --      Excl. in GC? --    No data found.  Updated Vital Signs BP 127/86 (BP Location: Right Arm)    Pulse (!) 116    Temp 99.3 F (37.4 C) (Oral)    Resp 18    Ht 5\' 3"  (1.6 m)    Wt 150 lb (68 kg)    LMP 12/06/2019    SpO2 99%    BMI 26.57 kg/m   Visual Acuity Right Eye Distance:   Left Eye Distance:   Bilateral Distance:    Right Eye Near:   Left Eye Near:    Bilateral Near:     Physical Exam Vitals and nursing note reviewed.  Constitutional:      General: She is not in acute distress.    Appearance: Normal appearance. She is normal weight. She is not toxic-appearing.  HENT:     Head: Normocephalic and atraumatic.     Right Ear: Ear canal and external ear normal.     Left Ear: Tympanic membrane, ear canal and external ear normal.     Ears:     Comments: Right tympanic membrane is erythematous and injected.  There is a loss of landmarks.  No perforation noted.    Nose: Congestion and rhinorrhea present.     Comments: The mucosa is erythematous and edematous with clear nasal discharge.    Mouth/Throat:     Mouth: Mucous membranes are moist.     Pharynx: Oropharynx is clear. Posterior oropharyngeal erythema present. No oropharyngeal exudate.     Comments: She has erythema to the posterior oropharynx without injection.  Clear postnasal drip visualized on exam. Eyes:      General: No scleral icterus.    Extraocular Movements: Extraocular movements intact.     Conjunctiva/sclera: Conjunctivae normal.     Pupils: Pupils are equal, round, and reactive to light.  Cardiovascular:     Rate and Rhythm: Normal rate and regular rhythm.     Pulses: Normal pulses.     Heart sounds: Normal heart sounds. No murmur heard. No gallop.   Pulmonary:     Effort: Pulmonary effort is normal.     Breath sounds: Normal breath sounds. No wheezing, rhonchi or rales.  Musculoskeletal:        General: No swelling or tenderness. Normal range of motion.     Cervical back: Normal range of motion and neck supple.  Lymphadenopathy:     Cervical: No cervical adenopathy.  Skin:    General: Skin is warm and dry.     Capillary Refill: Capillary refill takes less than 2 seconds.     Findings: No erythema or rash.  Neurological:     General: No focal deficit present.     Mental Status: She is alert and oriented to person, place, and time.  Psychiatric:        Mood and Affect: Mood normal.        Behavior: Behavior normal.        Thought Content: Thought content normal.        Judgment: Judgment normal.      UC Treatments / Results  Labs (all labs ordered are listed, but only abnormal results are displayed) Labs Reviewed  RESP PANEL BY RT-PCR (FLU A&B, COVID) ARPGX2 - Abnormal; Notable for the following components:      Result Value   Influenza  A by PCR POSITIVE (*)    All other components within normal limits    EKG   Radiology No results found.  Procedures Procedures (including critical care time)  Medications Ordered in UC Medications - No data to display  Initial Impression / Assessment and Plan / UC Course  I have reviewed the triage vital signs and the nursing notes.  Pertinent labs & imaging results that were available during my care of the patient were reviewed by me and considered in my medical decision making (see chart for details).   Is here for  evaluation of cough and nasal congestion that has been going on for 2 weeks.  Patient has completed 1 round of antibiotics and steroids but is still symptomatic.  Physical exam reveals erythematous and injected right tympanic membrane.  Nasal mucosa is also erythematous and edematous with clear nasal discharge.  Clear postnasal drip present exam as well.  Lung sounds are clear to auscultation in all fields.  Patient physical exam is consistent with an upper respiratory infection and right otitis media.  Since she started to feel better after the first round of azithromycin but then her son slipped and she and her husband went night and coughed all night long.  She reports that her symptoms sort of resurgence after that.  We will send respiratory triplex panel to evaluate for flu.  Less concerned about Covid given recent infection and also vaccination.  Respiratory panel is positive for influenza A.  Patient is outside the window of treatment for Xofluza as her symptoms started 4 days ago.  We will treat for otitis media with cefdinir 300 mg twice daily x10 days.  We will also give Zofran to help cover for any nausea.  Patient requesting Diflucan in case she gets a yeast infection from the cefdinir.  Final Clinical Impressions(s) / UC Diagnoses   Final diagnoses:  Influenza due to identified novel influenza A virus with other respiratory manifestations  Non-recurrent acute suppurative otitis media of right ear without spontaneous rupture of tympanic membrane     Discharge Instructions     Take the cefdinir twice daily for 10 days with food.  This is for your ear infection.  If the cefdinir makes you nauseous take a Zofran 20 minutes before each dose of cefdinir.  Use the Tessalon Perles as needed during the day for cough and use the Promethazine DM as needed for cough and congestion at bedtime.  Use Tylenol and ibuprofen as needed for fever and body aches.  Increase oral fluid intake to  help maintain hydration.  If you develop any new or worsening symptoms return for reevaluation.    ED Prescriptions    Medication Sig Dispense Auth. Provider   cefdinir (OMNICEF) 300 MG capsule Take 1 capsule (300 mg total) by mouth 2 (two) times daily for 10 days. 20 capsule Becky Augusta, NP   ondansetron (ZOFRAN ODT) 8 MG disintegrating tablet Take 1 tablet (8 mg total) by mouth every 8 (eight) hours as needed for nausea or vomiting. 20 tablet Becky Augusta, NP   benzonatate (TESSALON) 100 MG capsule Take 2 capsules (200 mg total) by mouth every 8 (eight) hours. 21 capsule Becky Augusta, NP   promethazine-dextromethorphan (PROMETHAZINE-DM) 6.25-15 MG/5ML syrup Take 5 mLs by mouth 4 (four) times daily as needed. 118 mL Becky Augusta, NP     PDMP not reviewed this encounter.   Becky Augusta, NP 12/17/19 1831    Becky Augusta, NP 12/17/19 1836

## 2019-12-17 NOTE — Discharge Instructions (Addendum)
Take the cefdinir twice daily for 10 days with food.  This is for your ear infection.  If the cefdinir makes you nauseous take a Zofran 20 minutes before each dose of cefdinir.  Use the Tessalon Perles as needed during the day for cough and use the Promethazine DM as needed for cough and congestion at bedtime.  Use Tylenol and ibuprofen as needed for fever and body aches.  Increase oral fluid intake to help maintain hydration.  If you develop any new or worsening symptoms return for reevaluation.

## 2019-12-17 NOTE — ED Triage Notes (Signed)
Patient c/o cough, nasal congestion x 1 week. She was prescribed Doxycyline last Wednesday but was unable to tolerate it so her doctor sent her in a Z-pak and steroids. She states she got better for about 2 days and she states her symptoms have worsened. Patient does report she had COVID in August.

## 2020-01-16 ENCOUNTER — Other Ambulatory Visit: Payer: Self-pay | Admitting: Internal Medicine

## 2020-01-16 NOTE — Telephone Encounter (Signed)
RX Refill: fioricet Last Seen:04-17-19 Last ordered:09-28-19

## 2020-02-24 ENCOUNTER — Other Ambulatory Visit: Payer: Self-pay | Admitting: Internal Medicine

## 2020-03-04 DIAGNOSIS — Z20822 Contact with and (suspected) exposure to covid-19: Secondary | ICD-10-CM | POA: Diagnosis not present

## 2020-03-06 DIAGNOSIS — G5623 Lesion of ulnar nerve, bilateral upper limbs: Secondary | ICD-10-CM | POA: Diagnosis not present

## 2020-04-04 ENCOUNTER — Other Ambulatory Visit: Payer: Self-pay

## 2020-04-04 MED FILL — Diltiazem HCl Coated Beads Tab ER 24HR 240 MG: ORAL | 30 days supply | Qty: 30 | Fill #0 | Status: AC

## 2020-04-04 MED FILL — Alprazolam Tab 0.25 MG: ORAL | 30 days supply | Qty: 30 | Fill #0 | Status: AC

## 2020-04-08 ENCOUNTER — Telehealth: Payer: 59 | Admitting: Nurse Practitioner

## 2020-04-08 ENCOUNTER — Other Ambulatory Visit: Payer: Self-pay

## 2020-04-08 DIAGNOSIS — J0100 Acute maxillary sinusitis, unspecified: Secondary | ICD-10-CM | POA: Diagnosis not present

## 2020-04-08 MED ORDER — AZITHROMYCIN 250 MG PO TABS
ORAL_TABLET | ORAL | 0 refills | Status: DC
  Filled 2020-04-08: qty 6, 5d supply, fill #0

## 2020-04-08 MED ORDER — DOXYCYCLINE HYCLATE 100 MG PO TABS
100.0000 mg | ORAL_TABLET | Freq: Two times a day (BID) | ORAL | 0 refills | Status: DC
  Filled 2020-04-08: qty 20, 10d supply, fill #0

## 2020-04-08 MED ORDER — LEVOFLOXACIN 500 MG PO TABS
500.0000 mg | ORAL_TABLET | Freq: Every day | ORAL | 0 refills | Status: DC
  Filled 2020-04-08: qty 7, 7d supply, fill #0

## 2020-04-08 NOTE — Addendum Note (Signed)
Addended by: Bennie Pierini on: 04/08/2020 12:13 PM   Modules accepted: Orders

## 2020-04-08 NOTE — Progress Notes (Signed)

## 2020-04-08 NOTE — Addendum Note (Signed)
Addended by: Bennie Pierini on: 04/08/2020 01:03 PM   Modules accepted: Orders

## 2020-04-09 ENCOUNTER — Encounter: Payer: Self-pay | Admitting: Emergency Medicine

## 2020-04-09 ENCOUNTER — Other Ambulatory Visit: Payer: Self-pay

## 2020-04-09 ENCOUNTER — Ambulatory Visit
Admission: EM | Admit: 2020-04-09 | Discharge: 2020-04-09 | Disposition: A | Discharge status: Home / Self Care | Payer: 59 | Attending: Sports Medicine | Admitting: Sports Medicine

## 2020-04-09 DIAGNOSIS — J0100 Acute maxillary sinusitis, unspecified: Secondary | ICD-10-CM | POA: Diagnosis not present

## 2020-04-09 DIAGNOSIS — R509 Fever, unspecified: Secondary | ICD-10-CM | POA: Diagnosis not present

## 2020-04-09 DIAGNOSIS — I472 Ventricular tachycardia: Secondary | ICD-10-CM | POA: Insufficient documentation

## 2020-04-09 DIAGNOSIS — R110 Nausea: Secondary | ICD-10-CM | POA: Insufficient documentation

## 2020-04-09 DIAGNOSIS — R1031 Right lower quadrant pain: Secondary | ICD-10-CM | POA: Diagnosis not present

## 2020-04-09 DIAGNOSIS — I4729 Other ventricular tachycardia: Secondary | ICD-10-CM

## 2020-04-09 DIAGNOSIS — R109 Unspecified abdominal pain: Secondary | ICD-10-CM | POA: Diagnosis not present

## 2020-04-09 LAB — URINALYSIS, COMPLETE (UACMP) WITH MICROSCOPIC
Bilirubin Urine: NEGATIVE
Glucose, UA: NEGATIVE mg/dL
Ketones, ur: NEGATIVE mg/dL
Leukocytes,Ua: NEGATIVE
Nitrite: NEGATIVE
Protein, ur: NEGATIVE mg/dL
Specific Gravity, Urine: 1.025 (ref 1.005–1.030)
WBC, UA: NONE SEEN WBC/hpf (ref 0–5)
pH: 6 (ref 5.0–8.0)

## 2020-04-09 LAB — PREGNANCY, URINE: Preg Test, Ur: NEGATIVE

## 2020-04-09 MED ORDER — ONDANSETRON HCL 4 MG PO TABS
4.0000 mg | ORAL_TABLET | Freq: Four times a day (QID) | ORAL | 0 refills | Status: DC
  Filled 2020-04-09: qty 12, 3d supply, fill #0

## 2020-04-09 NOTE — ED Provider Notes (Signed)
[facility]    CSN: [id number] Arrival date & time: 04/09/20  0803      History   Chief Complaint Chief Complaint  Patient presents with  . Fever  . Back Pain    HPI Lori Douglas is a 35 y.o. female.   Pleasant 35 year old female who presents for evaluation of right-sided flank pain and groin pain.  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  She has been doing a lot of lifting and thinks she may have strained something in her right groin.  She is also having some sinus congestion.  Was seen as an ED visit yesterday and put on Z-Pak.  Originally was going to be prescribed doxycycline but she said that she had some sort of reaction to it so that was discontinued and a Z-Pak was prescribed.  She took 2 pills last night for the sinus infection.  Normally sees Dr. Darrick Huntsman for her ongoing medical care.  She reports last night she had fever to 101.  Again that right-sided flank pain as well as some right groin pain.  No history of chronic UTIs.  No history of kidney stones.  She is also been very nauseous.  She also has loose stools which are chronic for her.  She does have a history of IBS.  She denies any chest pain shortness of breath or Covid-like symptoms.  She has been vaccinated x2.  No booster.  She has received her flu shot.  She had COVID in August 2021.  No Covid exposure.  No red flag signs or symptoms elicited on history.     Past Medical History:  Diagnosis Date  . Frequent headaches   . Heart murmur   . Migraine   . Preeclampsia   . Psoriasis     Patient Active Problem List   Diagnosis Date Noted  . S/P cholecystectomy 04/18/2019  . Blepharitis of eyelid of right eye 04/17/2019  . CTS (carpal tunnel syndrome) 04/17/2019  . Bronchitis due to COVID-19 virus 11/02/2018  . Easy bruising 01/12/2018  . Vitamin D deficiency 07/12/2017  . Encounter for preventive health examination 06/25/2017  . Exposure to influenza 01/28/2016  . Headache, chronic daily  09/18/2015  . Psoriasis 09/18/2015  . 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 Hormone deficiency 03/12/2013    Past Surgical History:  Procedure Laterality Date  . CESAREAN SECTION  2015  . CHOLECYSTECTOMY  2015    OB History   No obstetric history on file.      Home Medications    Prior to Admission medications   Medication Sig Start Date End Date Taking? Authorizing Provider  albuterol (PROVENTIL HFA) 108 (90 Base) MCG/ACT inhaler Inhale 2 puffs into the lungs every 6 (six) hours as needed for wheezing or shortness of breath. 11/13/18  Yes Sherlene Shams, MD  albuterol (VENTOLIN HFA) 108 (90 Base) MCG/ACT inhaler Inhale 2 puffs into the lungs every 6 (six) hours as needed for wheezing or shortness of breath. 08/21/19  Yes Hawks, Neysa Bonito A, FNP  ALPRAZolam Prudy Feeler) 0.25 MG tablet TAKE 1 TABLET BY MOUTH EACH NIGHT AT BEDTIME AS NEEDED FOR SLEEP 10/25/19 05/08/20 Yes Sherlene Shams, MD  amitriptyline (ELAVIL) 50 MG tablet TAKE 1 TABLET BY MOUTH AT BEDTIME. 10/25/19 10/24/20 Yes Sherlene Shams, MD  azithromycin (ZITHROMAX Z-PAK) 250 MG tablet As directed 04/08/20  Yes Martin, Mary-Margaret, FNP  butalbital-acetaminophen-caffeine (FIORICET) 50-325-40 MG tablet TAKE 1 TO 2 TABLETS BY MOUTH EVERY 6 HOURS AS NEEDED FOR HEADACHE. MAX OF 4 TABLETS DAILY. 01/16/20 01/15/21 Yes Sherlene Shams, MD  diltiazem (CARDIZEM LA) 240 MG 24 hr tablet TAKE 1 TABLET BY MOUTH DAILY. 02/24/20 02/23/21 Yes Sherlene Shams, MD  ondansetron (ZOFRAN) 4 MG tablet Take 1 tablet (4 mg total) by mouth every 6 (six) hours. 04/09/20  Yes Delton See, MD  Adapalene 0.3 % gel  08/31/17   [provider]  Apremilast (OTEZLA) 30 MG TABS Take 1 tablet by mouth twice daily as directed 09/19/18   Quentin Angst, MD  benzonatate (TESSALON) 100 MG capsule Take 2 capsules (200 mg total)  by mouth every 8 (eight) hours. 12/17/19   Becky Augusta, NP  cholecalciferol (VITAMIN D3) 25 MCG (1000 UT) tablet Take 4,000 Units by mouth daily.    [provider]  clobetasol (TEMOVATE) 0.05 % external solution  08/29/17   [provider]  ondansetron (ZOFRAN ODT) 8 MG disintegrating tablet Take 1 tablet (8 mg total) by mouth every 8 (eight) hours as needed for nausea or vomiting. 12/17/19   Becky Augusta, NP  promethazine-dextromethorphan (PROMETHAZINE-DM) 6.25-15 MG/5ML syrup Take 5 mLs by mouth 4 (four) times daily as needed. Patient taking differently: Take 5 mLs by mouth 4 (four) times daily as needed. 12/17/19   Becky Augusta, NP  Fluticasone-Salmeterol (ADVAIR DISKUS) 100-50 MCG/DOSE AEPB Inhale 1 puff into the lungs 2 (two) times daily. 04/17/19 12/17/19  Sherlene Shams, MD    Family History Family History  Problem Relation Age of Onset  . Arthritis Mother   . Anxiety disorder Mother   . Arthritis Father   . Hyperlipidemia Father   . Hypertension Father   . Depression Father   . Anxiety disorder Father   . Hodgkin's lymphoma Father   . Heart disease Father   . Endometriosis Sister   . Psoriasis Brother   . Diabetes Paternal Aunt   . Arthritis Maternal Grandmother   . Cancer Maternal Grandmother        ovarian and uterine cancer, breast cancer  . Heart disease Maternal Grandmother   . Stroke Maternal Grandmother   . Hypertension Maternal Grandmother   . Cancer Paternal Grandmother        colon cancer  . Alcohol abuse Paternal Grandfather     Social History Social History   Tobacco Use  . Smoking status: Never Smoker  . Smokeless tobacco: Never Used  Vaping Use  . Vaping Use: Never used  Substance Use Topics  . Alcohol use: No  . Drug use: No     Allergies   Biaxin [clarithromycin], Doxycycline, Sulfa antibiotics, and Penicillins   Review of Systems Review of Systems  Constitutional: Positive for fever. Negative for activity change,  appetite change, chills, diaphoresis and fatigue.  HENT: Positive for congestion and sinus pressure. Negative for ear discharge, ear pain, rhinorrhea, sneezing and sore throat.   Eyes: Negative.  Negative for pain.  Respiratory: Negative.  Negative for cough, chest tightness, shortness of breath, wheezing and stridor.   Cardiovascular: Negative.  Negative for chest pain and palpitations.  Gastrointestinal: Positive for abdominal pain, diarrhea and nausea. Negative for blood in stool, constipation and vomiting.  Genitourinary: Positive for flank pain. Negative for decreased urine volume, dysuria, frequency, hematuria, pelvic pain, urgency, vaginal bleeding, vaginal discharge and vaginal pain.  Musculoskeletal: Positive for back pain. Negative for arthralgias, gait problem,  joint swelling, myalgias, neck pain and neck stiffness.  Skin: Negative for color change, pallor, rash and wound.  Neurological: Negative for dizziness, tremors, seizures, speech difficulty, light-headedness, numbness and headaches.  All other systems reviewed and are negative.    Physical Exam Triage Vital Signs ED Triage Vitals  Enc Vitals Group     BP 04/09/20 0823 (!) 126/93     Pulse Rate 04/09/20 0823 (!) 120     Resp 04/09/20 0823 18     Temp 04/09/20 0823 99.1 F (37.3 C)     Temp Source 04/09/20 0823 Oral     SpO2 04/09/20 0823 100 %     Weight 04/09/20 0820 149 lb 14.6 oz (68 kg)     Height 04/09/20 0820 5\' 3"  (1.6 m)     Head Circumference --      Peak Flow --      Pain Score 04/09/20 0819 8     Pain Loc --      Pain Edu? --      Excl. in GC? --    No data found.  Updated Vital Signs BP 125/87 (BP Location: Left Arm)   Pulse (!) 118   Temp 99.1 F (37.3 C) (Oral)   Resp 18   Ht 5\' 3"  (1.6 m)   Wt 68 kg   LMP 03/11/2020   SpO2 100%   BMI 26.56 kg/m   Visual Acuity Right Eye Distance:   Left Eye Distance:   Bilateral Distance:    Right Eye Near:   Left Eye Near:    Bilateral Near:      Physical Exam Vitals and nursing note reviewed.  Constitutional:      General: She is not in acute distress.    Appearance: Normal appearance. She is not ill-appearing or toxic-appearing.  HENT:     Head: Normocephalic and atraumatic.     Right Ear: Tympanic membrane normal.     Left Ear: Tympanic membrane normal.     Nose:     Right Turbinates: Enlarged and swollen.     Left Turbinates: Enlarged and swollen.     Right Sinus: Maxillary sinus tenderness present.     Left Sinus: Maxillary sinus tenderness present.     Mouth/Throat:     Pharynx: No oropharyngeal exudate or posterior oropharyngeal erythema.  Eyes:     General:        Right eye: No discharge.        Left eye: No discharge.     Extraocular Movements: Extraocular movements intact.     Conjunctiva/sclera: Conjunctivae normal.     Pupils: Pupils are equal, round, and reactive to light.  Cardiovascular:     Rate and Rhythm: Tachycardia present.     Pulses: Normal pulses.     Heart sounds: Normal heart sounds. No murmur heard. No friction rub. No gallop.      Comments: She reports she has baseline tachycardia.  She is somewhat anxious in the office. Pulmonary:     Effort: Pulmonary effort is normal. No respiratory distress.     Breath sounds: Normal breath sounds. No stridor. No wheezing or rales.  Abdominal:     General: Abdomen is flat. Bowel sounds are normal. There is no distension. There are no signs of injury.     Palpations: Abdomen is soft.     Tenderness: There is no abdominal tenderness. There is no right CVA tenderness, left CVA tenderness, guarding or rebound. Negative signs include Murphy's sign, Rovsing's  sign, McBurney's sign and psoas sign.     Hernia: No hernia is present.  Musculoskeletal:     Cervical back: Neck supple. No rigidity or tenderness.  Lymphadenopathy:     Cervical: Cervical adenopathy present.  Skin:    General: Skin is warm and dry.     Capillary Refill: Capillary refill takes less  than 2 seconds.     Coloration: Skin is not jaundiced.     Findings: No bruising, erythema, lesion or rash.  Neurological:     General: No focal deficit present.     Mental Status: She is alert and oriented to person, place, and time.  Psychiatric:        Mood and Affect: Mood is anxious.      UC Treatments / Results  Labs (all labs ordered are listed, but only abnormal results are displayed) Labs Reviewed  URINALYSIS, COMPLETE (UACMP) WITH MICROSCOPIC - Abnormal; Notable for the following components:      Result Value   Hgb urine dipstick SMALL (*)    Bacteria, UA RARE (*)    All other components within normal limits  PREGNANCY, URINE    EKG   Radiology No results found.  Procedures Procedures (including critical care time)  Medications Ordered in UC Medications - No data to display  Initial Impression / Assessment and Plan / UC Course  I have reviewed the triage vital signs and the nursing notes.  Pertinent labs & imaging results that were available during my care of the patient were reviewed by me and considered in my medical decision making (see chart for details).  Clinical impression: 1.  Right-sided flank pain acute began last night 2.  Right groin pain 3.  Sinusitis on an antibiotic. 4.  Tachycardia, chronic in nature at her baseline per the patient.  No evidence of any cardiac issues on history or examination. 5.  Significant nausea without vomiting.  Treatment plan: 1.  The findings and treatment plan were discussed in detail with the patient.  Patient was in agreement. 2.  We will get a UA.  Results are above.  No evidence of a urinary tract infection. 3.  We will get a urine pregnancy test.  It was pending at the time of discharge and we will call the patient with the results. 4.  For her nausea I sent in some Zofran to the pharmacy. 5.  Right groin pain probably from lifting in the OR.  There is no evidence of a hernia on examination. 6.  Chronic  tachycardia with some anxiety.  She may benefit from a beta-blocker as she is on medicine for hypertension.  I will leave that to the primary care provider.  No evidence of any cardiac issues today. 7.  For her fever I just recommended round-the-clock Tylenol or Motrin. 8.  If her symptoms persist she should see her primary care provider. 9.  For sinusitis it is fine to go ahead and continue with the antibiotic prescribed from the visit. 10.  Follow-up here as needed.    Final Clinical Impressions(s) / UC Diagnoses   Final diagnoses:  Acute right flank pain  Nausea without vomiting  Right groin pain  Acute non-recurrent maxillary sinusitis  Fever, unspecified  Paroxysmal ventricular tachycardia (HCC)     Discharge Instructions     Your urine does not show a urinary tract infection. Your urine pregnancy test was pending at the time of discharge.  You can check the results on the app MyChart.  Someone will contact you if the results are positive. I have prescribed Zofran for your nausea.  You can pick that up at your drugstore. Continue with the antibiotic prescribed for the E visit for your sinusitis. You do have chronic tachycardia and you can check with Dr. Darrick Huntsman if there was a clinical indication to put you on a beta-blocker in the future. For your fever I just recommended Tylenol or Motrin. I have given you some educational handouts. You do not have a hernia on examination but just monitor your symptoms and if it persist she may need to see general surgery.  That referral needs to come from your primary care provider. You are welcome to follow-up here in the future.  I hope you get to feeling better, Dr. Zachery Dauer    ED Prescriptions    Medication Sig Dispense Auth. Provider   ondansetron (ZOFRAN) 4 MG tablet Take 1 tablet (4 mg total) by mouth every 6 (six) hours. 12 tablet Delton See, MD     PDMP not reviewed this encounter.   Delton See, MD 04/09/20 [phone]

## 2020-04-09 NOTE — ED Triage Notes (Signed)
Pt c/o lower back pain, fever, and right groin pain. started last night. She states she did an e visit yesterday for a sinus infection. She does not usually have fevers with sinus infection.

## 2020-04-09 NOTE — Discharge Instructions (Addendum)
Your urine does not show a urinary tract infection. Your urine pregnancy test was pending at the time of discharge.  You can check the results on the app MyChart.  Someone will contact you if the results are positive. I have prescribed Zofran for your nausea.  You can pick that up at your drugstore. Continue with the antibiotic prescribed for the E visit for your sinusitis. You do have chronic tachycardia and you can check with Dr. Darrick Huntsman if there was a clinical indication to put you on a beta-blocker in the future. For your fever I just recommended Tylenol or Motrin. I have given you some educational handouts. You do not have a hernia on examination but just monitor your symptoms and if it persist she may need to see general surgery.  That referral needs to come from your primary care provider. You are welcome to follow-up here in the future.  I hope you get to feeling better, Dr. Zachery Dauer

## 2020-04-24 ENCOUNTER — Other Ambulatory Visit: Payer: Self-pay

## 2020-05-06 ENCOUNTER — Other Ambulatory Visit: Payer: Self-pay

## 2020-05-06 ENCOUNTER — Encounter: Payer: Self-pay | Admitting: Internal Medicine

## 2020-05-06 ENCOUNTER — Ambulatory Visit (INDEPENDENT_AMBULATORY_CARE_PROVIDER_SITE_OTHER): Payer: 59 | Admitting: Internal Medicine

## 2020-05-06 VITALS — BP 120/82 | HR 99 | Temp 97.4°F | Resp 14 | Ht 63.0 in | Wt 147.4 lb

## 2020-05-06 DIAGNOSIS — R3129 Other microscopic hematuria: Secondary | ICD-10-CM

## 2020-05-06 DIAGNOSIS — E559 Vitamin D deficiency, unspecified: Secondary | ICD-10-CM

## 2020-05-06 DIAGNOSIS — R109 Unspecified abdominal pain: Secondary | ICD-10-CM

## 2020-05-06 DIAGNOSIS — F5102 Adjustment insomnia: Secondary | ICD-10-CM | POA: Diagnosis not present

## 2020-05-06 DIAGNOSIS — E782 Mixed hyperlipidemia: Secondary | ICD-10-CM

## 2020-05-06 DIAGNOSIS — Z0000 Encounter for general adult medical examination without abnormal findings: Secondary | ICD-10-CM

## 2020-05-06 DIAGNOSIS — R5383 Other fatigue: Secondary | ICD-10-CM | POA: Diagnosis not present

## 2020-05-06 MED ORDER — HYDROCODONE-ACETAMINOPHEN 10-325 MG PO TABS: 1.0000 | ORAL_TABLET | Freq: Four times a day (QID) | ORAL | 0 refills | Status: DC | PRN

## 2020-05-06 MED ORDER — HYDROCODONE-ACETAMINOPHEN 10-325 MG PO TABS
1.0000 | ORAL_TABLET | Freq: Four times a day (QID) | ORAL | 0 refills | Status: DC | PRN
  Filled 2020-05-06: qty 30, 8d supply, fill #0

## 2020-05-06 MED ORDER — PREDNISONE 10 MG PO TABS
ORAL_TABLET | ORAL | 0 refills | Status: DC
  Filled 2020-05-06: qty 21, 6d supply, fill #0

## 2020-05-06 NOTE — Progress Notes (Signed)
Patient ID: Lori Douglas, female    DOB: Dec 26, 1985  Age: 35 y.o. MRN: [id number]  The patient is here for annual preventive examination and management of other chronic and acute problems.  GYN and breast exam done by her gynecologist.    The risk factors are reflected in the social history.  The roster of all physicians providing medical care to patient - is listed in the Snapshot section of the chart.  Activities of daily living:  The patient is 100% independent in all ADLs: dressing, toileting, feeding as well as independent mobility  Home safety : The patient has smoke detectors in the home. They wear seatbelts.  There are no firearms at home. There is no violence in the home.   There is no risks for hepatitis, STDs or HIV. There is no   history of blood transfusion. They have no travel history to infectious disease endemic areas of the world.  The patient has seen their dentist in the last six month. They have seen their eye doctor in the last year. She denies hearing difficulty with regard to whispered voices and some television programs.  They have deferred audiologic testing in the last year.  They do not  have excessive sun exposure. Discussed the need for sun protection: hats, long sleeves and use of sunscreen if there is significant sun exposure.   Diet: the importance of a healthy diet is discussed. They do have a healthy diet.  The benefits of regular aerobic exercise were discussed. She walks 4 times per week ,  20 minutes.   Depression screen: there are no signs or vegative symptoms of depression- irritability, change in appetite, anhedonia, sadness/tearfullness.  The following portions of the patient's history were reviewed and updated as appropriate: allergies, current medications, past family history, past medical history,  past surgical history, past social history  and problem list.  Visual acuity was not assessed per patient preference since she has regular follow up  with her ophthalmologist. Hearing and body mass index were assessed and reviewed.   During the course of the visit the patient was educated and counseled about appropriate screening and preventive services including : fall prevention , diabetes screening, nutrition counseling, colorectal cancer screening, and recommended immunizations.    CC: The primary encounter diagnosis was Vitamin D deficiency. Diagnoses of Fatigue, unspecified type, Moderate mixed hyperlipidemia not requiring statin therapy, Acute left flank pain, Other microscopic hematuria, Acute right flank pain, Encounter for preventive health examination, and Insomnia due to psychological stress were also pertinent to this visit.  1) recent episode of right sided back pain  April 7 severe pain ,  Fever to 101.  One day after starting aw z pack for sinusitis.  [facility] out kidney stones . ua and pregnancy test done.   Was not sent for CT scan although 11-20 RBCS  Were noted In urine.  She continues to have pain in the same area and in the groin on the same side,  She has no personal history of renal calculi but several family members have had recurrent nephrolithiasis   History Vi has a past medical history of Bronchitis due to COVID-19 virus (11/02/2018), Easy bruising (01/12/2018), Frequent headaches, Headache, chronic daily (09/18/2015), Heart murmur, Migraine, Preeclampsia, and Psoriasis.   She has a past surgical history that includes Cholecystectomy (2015) and Cesarean section (2015).   Her family history includes Alcohol abuse in her paternal grandfather; Anxiety disorder in her father and mother; Arthritis  in her father, maternal grandmother, and mother; Cancer in her maternal grandmother and paternal grandmother; Depression in her father; Diabetes in her paternal aunt; Endometriosis in her sister; Heart disease in her father and maternal grandmother; Hodgkin's lymphoma in her father; Hyperlipidemia in her father;  Hypertension in her father and maternal grandmother; Psoriasis in her brother; Stroke in her maternal grandmother.She reports that she has never smoked. She has never used smokeless tobacco. She reports that she does not drink alcohol and does not use drugs.  Outpatient Medications Prior to Visit  Medication Sig Dispense Refill  . albuterol (PROVENTIL HFA) 108 (90 Base) MCG/ACT inhaler Inhale 2 puffs into the lungs every 6 (six) hours as needed for wheezing or shortness of breath. 18 g 1  . amitriptyline (ELAVIL) 50 MG tablet TAKE 1 TABLET BY MOUTH AT BEDTIME. 90 tablet 1  . cholecalciferol (VITAMIN D3) 25 MCG (1000 UT) tablet Take 4,000 Units by mouth daily.    . clobetasol (TEMOVATE) 0.05 % external solution   3  . diltiazem (CARDIZEM LA) 240 MG 24 hr tablet TAKE 1 TABLET BY MOUTH DAILY. 30 tablet 2  . loratadine (CLARITIN) 10 MG tablet Take 10 mg by mouth daily.    . ondansetron (ZOFRAN) 4 MG tablet Take 1 tablet (4 mg total) by mouth every 6 (six) hours. 12 tablet 0  . ALPRAZolam (XANAX) 0.25 MG tablet TAKE 1 TABLET BY MOUTH EACH NIGHT AT BEDTIME AS NEEDED FOR SLEEP 30 tablet 5  . butalbital-acetaminophen-caffeine (FIORICET) 50-325-40 MG tablet TAKE 1 TO 2 TABLETS BY MOUTH EVERY 6 HOURS AS NEEDED FOR HEADACHE. MAX OF 4 TABLETS DAILY. 60 tablet 5  . Adapalene 0.3 % gel  (Patient not taking: Reported on 05/06/2020)  2  . Apremilast (OTEZLA) 30 MG TABS Take 1 tablet by mouth twice daily as directed (Patient not taking: Reported on 05/06/2020) 60 tablet 4  . albuterol (VENTOLIN HFA) 108 (90 Base) MCG/ACT inhaler Inhale 2 puffs into the lungs every 6 (six) hours as needed for wheezing or shortness of breath. (Patient not taking: Reported on 05/06/2020) 8 g 0  . azithromycin (ZITHROMAX Z-PAK) 250 MG tablet As directed (Patient not taking: Reported on 05/06/2020) 6 tablet 0  . benzonatate (TESSALON) 100 MG capsule Take 2 capsules (200 mg total) by mouth every 8 (eight) hours. (Patient not taking: Reported on  05/06/2020) 21 capsule 0  . ondansetron (ZOFRAN ODT) 8 MG disintegrating tablet Take 1 tablet (8 mg total) by mouth every 8 (eight) hours as needed for nausea or vomiting. (Patient not taking: Reported on 05/06/2020) 20 tablet 0  . promethazine-dextromethorphan (PROMETHAZINE-DM) 6.25-15 MG/5ML syrup Take 5 mLs by mouth 4 (four) times daily as needed. (Patient not taking: Reported on 05/06/2020) 118 mL 0   No facility-administered medications prior to visit.    Review of Systems  Patient denies headache, fevers, malaise, unintentional weight loss, skin rash, eye pain, sinus congestion and sinus pain, sore throat, dysphagia,  hemoptysis , cough, dyspnea, wheezing, chest pain, palpitations, orthopnea, edema, abdominal pain, nausea, melena, diarrhea, constipation, flank pain, dysuria, hematuria, urinary  Frequency, nocturia, numbness, tingling, seizures,  Focal weakness, Loss of consciousness,  Tremor, insomnia, depression, anxiety, and suicidal ideation.     Objective:  BP 120/82 (BP Location: Left Arm, Patient Position: Sitting, Cuff Size: Normal)   Pulse 99   Temp (!) 97.4 F (36.3 C) (Temporal)   Resp 14   Ht 5\' 3"  (1.6 m)   Wt 147 lb 6.4 oz (66.9 kg)  SpO2 99%   BMI 26.11 kg/m   Physical Exam  General appearance: alert, cooperative and appears stated age Ears: normal TM's and external ear canals both ears Throat: lips, mucosa, and tongue normal; teeth and gums normal Neck: no adenopathy, no carotid bruit, supple, symmetrical, trachea midline and thyroid not enlarged, symmetric, no tenderness/mass/nodules Back: symmetric, no curvature. ROM normal. No CVA tenderness. Lungs: clear to auscultation bilaterally Heart: regular rate and rhythm, S1, S2 normal, no murmur, click, rub or gallop Abdomen: soft,bowel sounds normal; no masses,  no organomegaly. Tender to deep palpation in RLQ without guarding  Pulses: 2+ and symmetric Skin: Skin color, texture, turgor normal. No rashes or  lesions Lymph nodes: Cervical, supraclavicular, and axillary nodes normal.  Assessment & Plan:   Problem List Items Addressed This Visit      Unprioritized   Insomnia due to psychological stress    Managed with elavil  and prn use of alprazolam . The risks and benefits of daily benzodiazepine use were reviewed with patient today including excessive sedation leading to respiratory depression,  impaired thinking/driving, and addiction.  Patient was advised to avoid concurrent use with alcohol, to use medication only as needed and not to share with others  .  Refills given       Encounter for preventive health examination    age appropriate education and counseling updated, referrals for preventative services and immunizations addressed, dietary and smoking counseling addressed, most recent labs reviewed.  I have personally reviewed and have noted:  1) the patient's medical and social history 2) The pt's use of alcohol, tobacco, and illicit drugs 3) The patient's current medications and supplements 4) Functional ability including ADL's, fall risk, home safety risk, hearing and visual impairment 5) Diet and physical activities 6) Evidence for depression or mood disorder 7) The patient's height, weight, and BMI have been recorded in the chart  I have made referrals, and provided counseling and education based on review of the above      Vitamin D deficiency - Primary   Relevant Orders   VITAMIN D 25 Hydroxy (Vit-D Deficiency, Fractures)   Acute right flank pain    Now present for approximately 4 weeks,  Although less severe than when it originally occurred.   ddx includes ovarian cyst and renal calculi.  CT abd and pelvis with oral contrast ordered.        Other Visit Diagnoses    Fatigue, unspecified type       Relevant Orders   CBC with Differential/Platelet   Comprehensive metabolic panel   TSH   Moderate mixed hyperlipidemia not requiring statin therapy       Relevant Orders    Lipid panel   Acute left flank pain       Relevant Orders   CT Abdomen Pelvis Wo Contrast   Other microscopic hematuria       Relevant Orders   CT Abdomen Pelvis Wo Contrast      I have discontinued Courtenay N. Bertsch's benzonatate, promethazine-dextromethorphan, butalbital-acetaminophen-caffeine, and azithromycin. I am also having her start on predniSONE. Additionally, I am having her maintain her cholecalciferol, Adapalene, clobetasol, Otezla, albuterol, diltiazem, amitriptyline, ondansetron, loratadine, HYDROcodone-acetaminophen, and ALPRAZolam.  Meds ordered this encounter  Medications  . predniSONE (DELTASONE) 10 MG tablet    Sig: 6 tablets on Day 1 , then reduce by 1 tablet daily until gone    Dispense:  21 tablet    Refill:  0  . DISCONTD: HYDROcodone-acetaminophen (NORCO) 10-325 MG tablet  Sig: Take 1 tablet by mouth every 6 (six) hours as needed.    Dispense:  30 tablet    Refill:  0  . HYDROcodone-acetaminophen (NORCO) 10-325 MG tablet    Sig: Take 1 tablet by mouth every 6 (six) hours as needed.    Dispense:  30 tablet    Refill:  0  . ALPRAZolam (XANAX) 0.25 MG tablet    Sig: TAKE 1 TABLET BY MOUTH EACH NIGHT AT BEDTIME AS NEEDED FOR SLEEP    Dispense:  30 tablet    Refill:  5    KEEP ON FILE FOR FUTURE REFILLS    Medications Discontinued During This Encounter  Medication Reason  . albuterol (VENTOLIN HFA) 108 (90 Base) MCG/ACT inhaler   . azithromycin (ZITHROMAX Z-PAK) 250 MG tablet Completed Course  . benzonatate (TESSALON) 100 MG capsule Completed Course  . ondansetron (ZOFRAN ODT) 8 MG disintegrating tablet Change in therapy  . promethazine-dextromethorphan (PROMETHAZINE-DM) 6.25-15 MG/5ML syrup Completed Course  . butalbital-acetaminophen-caffeine (FIORICET) 50-325-40 MG tablet   . HYDROcodone-acetaminophen (NORCO) 10-325 MG tablet   . ALPRAZolam (XANAX) 0.25 MG tablet Reorder    Follow-up: No follow-ups on file.   Sherlene Shams, MD

## 2020-05-06 NOTE — Patient Instructions (Signed)
Prednisone taper and increase claritin to twice daily   Add back flonase if not controlled.  We can add singulair as a 3rd modality    I will order CT abd and pelvis to rule out stones    Return at your leisure for fasting labs

## 2020-05-08 ENCOUNTER — Other Ambulatory Visit: Payer: Self-pay

## 2020-05-08 ENCOUNTER — Encounter: Payer: Self-pay | Admitting: Internal Medicine

## 2020-05-08 DIAGNOSIS — R10A1 Flank pain, right side: Secondary | ICD-10-CM | POA: Insufficient documentation

## 2020-05-08 DIAGNOSIS — R109 Unspecified abdominal pain: Secondary | ICD-10-CM | POA: Insufficient documentation

## 2020-05-08 MED ORDER — ALPRAZOLAM 0.25 MG PO TABS
ORAL_TABLET | ORAL | 5 refills | Status: DC
  Filled 2020-05-08: qty 30, 30d supply, fill #0
  Filled 2020-08-04: qty 30, 30d supply, fill #1
  Filled 2020-09-29 – 2020-09-30 (×2): qty 30, 30d supply, fill #2

## 2020-05-08 NOTE — Assessment & Plan Note (Signed)

## 2020-05-08 NOTE — Assessment & Plan Note (Addendum)
Managed with elavil  and prn use of alprazolam . The risks and benefits of daily benzodiazepine use were reviewed with patient today including excessive sedation leading to respiratory depression,  impaired thinking/driving, and addiction.  Patient was advised to avoid concurrent use with alcohol, to use medication only as needed and not to share with others  .  Refills given

## 2020-05-08 NOTE — Assessment & Plan Note (Signed)
Now present for approximately 4 weeks,  Although less severe than when it originally occurred.   ddx includes ovarian cyst and renal calculi.  CT abd and pelvis with oral contrast ordered.

## 2020-05-13 ENCOUNTER — Other Ambulatory Visit (INDEPENDENT_AMBULATORY_CARE_PROVIDER_SITE_OTHER): Payer: 59

## 2020-05-13 ENCOUNTER — Other Ambulatory Visit: Payer: Self-pay

## 2020-05-13 DIAGNOSIS — E782 Mixed hyperlipidemia: Secondary | ICD-10-CM

## 2020-05-13 DIAGNOSIS — E559 Vitamin D deficiency, unspecified: Secondary | ICD-10-CM

## 2020-05-13 DIAGNOSIS — R5383 Other fatigue: Secondary | ICD-10-CM | POA: Diagnosis not present

## 2020-05-13 LAB — COMPREHENSIVE METABOLIC PANEL
ALT: 64 U/L — ABNORMAL HIGH (ref 0–35)
AST: 21 U/L (ref 0–37)
Albumin: 4.3 g/dL (ref 3.5–5.2)
Alkaline Phosphatase: 74 U/L (ref 39–117)
BUN: 9 mg/dL (ref 6–23)
CO2: 28 mEq/L (ref 19–32)
Calcium: 8.8 mg/dL (ref 8.4–10.5)
Chloride: 100 mEq/L (ref 96–112)
Creatinine, Ser: 0.8 mg/dL (ref 0.40–1.20)
GFR: 97.39 mL/min (ref >60.00)
Glucose, Bld: 80 mg/dL (ref 70–99)
Potassium: 3.1 mEq/L — ABNORMAL LOW (ref 3.5–5.1)
Sodium: 137 mEq/L (ref 135–145)
Total Bilirubin: 0.4 mg/dL (ref 0.2–1.2)
Total Protein: 6.6 g/dL (ref 6.0–8.3)

## 2020-05-13 LAB — CBC WITH DIFFERENTIAL/PLATELET
Basophils Absolute: 0.1 10*3/uL (ref 0.0–0.1)
Basophils Relative: 0.7 % (ref 0.0–3.0)
Eosinophils Absolute: 0.1 10*3/uL (ref 0.0–0.7)
Eosinophils Relative: 0.7 % (ref 0.0–5.0)
HCT: 38.9 % (ref 36.0–46.0)
Hemoglobin: 13.3 g/dL (ref 12.0–15.0)
Lymphocytes Relative: 44.2 % (ref 12.0–46.0)
Lymphs Abs: 4.8 10*3/uL — ABNORMAL HIGH (ref 0.7–4.0)
MCHC: 34.1 g/dL (ref 30.0–36.0)
MCV: 86.9 fl (ref 78.0–100.0)
Monocytes Absolute: 0.6 10*3/uL (ref 0.1–1.0)
Monocytes Relative: 5.9 % (ref 3.0–12.0)
Neutro Abs: 5.3 10*3/uL (ref 1.4–7.7)
Neutrophils Relative %: 48.5 % (ref 43.0–77.0)
Platelets: 309 10*3/uL (ref 150.0–400.0)
RBC: 4.47 Mil/uL (ref 3.87–5.11)
RDW: 13.9 % (ref 11.5–15.5)
WBC: 10.9 10*3/uL — ABNORMAL HIGH (ref 4.0–10.5)

## 2020-05-13 LAB — LIPID PANEL
Cholesterol: 105 mg/dL (ref 0–200)
HDL: 54.7 mg/dL (ref >39.00)
LDL Cholesterol: 24 mg/dL (ref 0–99)
NonHDL: 50.06
Total CHOL/HDL Ratio: 2
Triglycerides: 132 mg/dL (ref 0.0–149.0)
VLDL: 26.4 mg/dL (ref 0.0–40.0)

## 2020-05-13 LAB — VITAMIN D 25 HYDROXY (VIT D DEFICIENCY, FRACTURES): VITD: 20.12 ng/mL — ABNORMAL LOW (ref 30.00–100.00)

## 2020-05-13 LAB — TSH: TSH: 2.16 u[IU]/mL (ref 0.35–4.50)

## 2020-05-15 ENCOUNTER — Other Ambulatory Visit: Payer: Self-pay | Admitting: Internal Medicine

## 2020-05-15 ENCOUNTER — Encounter: Payer: Self-pay | Admitting: Internal Medicine

## 2020-05-15 ENCOUNTER — Telehealth: Payer: Self-pay | Admitting: Internal Medicine

## 2020-05-15 ENCOUNTER — Other Ambulatory Visit: Payer: Self-pay

## 2020-05-15 DIAGNOSIS — R7401 Elevation of levels of liver transaminase levels: Secondary | ICD-10-CM | POA: Insufficient documentation

## 2020-05-15 DIAGNOSIS — E559 Vitamin D deficiency, unspecified: Secondary | ICD-10-CM

## 2020-05-15 MED ORDER — ERGOCALCIFEROL 1.25 MG (50000 UT) PO CAPS
50000.0000 [IU] | ORAL_CAPSULE | ORAL | 3 refills | Status: DC
  Filled 2020-05-15: qty 4, 28d supply, fill #0

## 2020-05-15 NOTE — Telephone Encounter (Signed)
Pt called about scheduling CT Scan

## 2020-05-18 ENCOUNTER — Other Ambulatory Visit: Payer: Self-pay

## 2020-05-19 ENCOUNTER — Other Ambulatory Visit: Payer: Self-pay | Admitting: Internal Medicine

## 2020-05-19 ENCOUNTER — Other Ambulatory Visit: Payer: Self-pay

## 2020-05-19 DIAGNOSIS — E876 Hypokalemia: Secondary | ICD-10-CM

## 2020-05-19 MED ORDER — POTASSIUM CHLORIDE CRYS ER 20 MEQ PO TBCR
20.0000 meq | EXTENDED_RELEASE_TABLET | Freq: Two times a day (BID) | ORAL | 3 refills | Status: DC
  Filled 2020-05-19: qty 6, 3d supply, fill #0

## 2020-05-19 MED FILL — Diltiazem HCl Coated Beads Tab ER 24HR 240 MG: ORAL | 30 days supply | Qty: 30 | Fill #1 | Status: AC

## 2020-06-02 ENCOUNTER — Other Ambulatory Visit: Payer: Self-pay

## 2020-06-02 ENCOUNTER — Other Ambulatory Visit (INDEPENDENT_AMBULATORY_CARE_PROVIDER_SITE_OTHER): Payer: 59

## 2020-06-02 DIAGNOSIS — R7401 Elevation of levels of liver transaminase levels: Secondary | ICD-10-CM | POA: Diagnosis not present

## 2020-06-02 DIAGNOSIS — E876 Hypokalemia: Secondary | ICD-10-CM

## 2020-06-02 LAB — COMPREHENSIVE METABOLIC PANEL
ALT: 85 U/L — ABNORMAL HIGH (ref 0–35)
AST: 23 U/L (ref 0–37)
Albumin: 4.3 g/dL (ref 3.5–5.2)
Alkaline Phosphatase: 89 U/L (ref 39–117)
BUN: 10 mg/dL (ref 6–23)
CO2: 25 mEq/L (ref 19–32)
Calcium: 9.2 mg/dL (ref 8.4–10.5)
Chloride: 103 mEq/L (ref 96–112)
Creatinine, Ser: 0.77 mg/dL (ref 0.40–1.20)
GFR: 101.92 mL/min (ref >60.00)
Glucose, Bld: 105 mg/dL — ABNORMAL HIGH (ref 70–99)
Potassium: 3.8 mEq/L (ref 3.5–5.1)
Sodium: 138 mEq/L (ref 135–145)
Total Bilirubin: 0.5 mg/dL (ref 0.2–1.2)
Total Protein: 6.4 g/dL (ref 6.0–8.3)

## 2020-06-02 LAB — CBC
HCT: 41.6 % (ref 36.0–46.0)
Hemoglobin: 13.9 g/dL (ref 12.0–15.0)
MCHC: 33.4 g/dL (ref 30.0–36.0)
MCV: 88.5 fl (ref 78.0–100.0)
Platelets: 324 10*3/uL (ref 150.0–400.0)
RBC: 4.7 Mil/uL (ref 3.87–5.11)
RDW: 14.6 % (ref 11.5–15.5)
WBC: 5.8 10*3/uL (ref 4.0–10.5)

## 2020-06-02 LAB — MAGNESIUM: Magnesium: 1.9 mg/dL (ref 1.5–2.5)

## 2020-06-03 ENCOUNTER — Other Ambulatory Visit: Payer: Self-pay | Admitting: Internal Medicine

## 2020-06-03 DIAGNOSIS — R7401 Elevation of levels of liver transaminase levels: Secondary | ICD-10-CM

## 2020-06-05 ENCOUNTER — Ambulatory Visit
Admission: RE | Admit: 2020-06-05 | Discharge: 2020-06-05 | Disposition: A | Discharge status: Home / Self Care | Payer: 59 | Source: Ambulatory Visit | Attending: Internal Medicine | Admitting: Internal Medicine

## 2020-06-05 ENCOUNTER — Other Ambulatory Visit: Payer: Self-pay

## 2020-06-05 DIAGNOSIS — Z9049 Acquired absence of other specified parts of digestive tract: Secondary | ICD-10-CM | POA: Diagnosis not present

## 2020-06-05 DIAGNOSIS — R1031 Right lower quadrant pain: Secondary | ICD-10-CM | POA: Diagnosis not present

## 2020-06-05 DIAGNOSIS — R3129 Other microscopic hematuria: Secondary | ICD-10-CM | POA: Diagnosis not present

## 2020-06-12 ENCOUNTER — Other Ambulatory Visit: Payer: Self-pay

## 2020-06-12 ENCOUNTER — Other Ambulatory Visit (INDEPENDENT_AMBULATORY_CARE_PROVIDER_SITE_OTHER): Payer: 59

## 2020-06-12 DIAGNOSIS — R7401 Elevation of levels of liver transaminase levels: Secondary | ICD-10-CM

## 2020-06-12 LAB — IBC + FERRITIN
Ferritin: 23.8 ng/mL (ref 10.0–291.0)
Iron: 44 ug/dL (ref 42–145)
Saturation Ratios: 10.6 % — ABNORMAL LOW (ref 20.0–50.0)
Transferrin: 297 mg/dL (ref 212.0–360.0)

## 2020-06-12 LAB — HEPATIC FUNCTION PANEL
ALT: 17 U/L (ref 0–35)
AST: 10 U/L (ref 0–37)
Albumin: 4.3 g/dL (ref 3.5–5.2)
Alkaline Phosphatase: 73 U/L (ref 39–117)
Bilirubin, Direct: 0.1 mg/dL (ref 0.0–0.3)
Total Bilirubin: 0.6 mg/dL (ref 0.2–1.2)
Total Protein: 6.4 g/dL (ref 6.0–8.3)

## 2020-06-15 LAB — ANTI-SMITH ANTIBODY: ENA SM Ab Ser-aCnc: <1 AI

## 2020-06-17 LAB — HEPATITIS B SURFACE ANTIBODY,QUALITATIVE: Hep B S Ab: BORDERLINE — AB

## 2020-06-17 LAB — CERULOPLASMIN: Ceruloplasmin: 25 mg/dL (ref 18–53)

## 2020-06-17 LAB — ANA: Anti Nuclear Antibody (ANA): NEGATIVE

## 2020-06-17 LAB — HEPATITIS C ANTIBODY
Hepatitis C Ab: NONREACTIVE
SIGNAL TO CUT-OFF: 0 (ref <1.00)

## 2020-06-17 LAB — MITOCHONDRIAL ANTIBODIES: Mitochondrial M2 Ab, IgG: <=20 U

## 2020-06-17 LAB — ANTI-SMOOTH MUSCLE ANTIBODY, IGG: Actin (Smooth Muscle) Antibody (IGG): <20 U (ref <20)

## 2020-06-17 LAB — HEPATITIS B SURFACE ANTIGEN: Hepatitis B Surface Ag: NONREACTIVE

## 2020-06-17 LAB — ANTI-MICROSOMAL ANTIBODY LIVER / KIDNEY: LKM1 Ab: <=20 U (ref <=20.0)

## 2020-06-17 LAB — HEPATITIS B CORE ANTIBODY, TOTAL: Hep B Core Total Ab: NONREACTIVE

## 2020-06-17 LAB — ALPHA-1-ANTITRYPSIN: A-1 Antitrypsin, Ser: 142 mg/dL (ref 83–199)

## 2020-06-22 ENCOUNTER — Other Ambulatory Visit: Payer: Self-pay

## 2020-06-22 ENCOUNTER — Other Ambulatory Visit: Payer: Self-pay | Admitting: Internal Medicine

## 2020-06-22 MED ORDER — DILTIAZEM HCL ER COATED BEADS 240 MG PO TB24
ORAL_TABLET | Freq: Every day | ORAL | 2 refills | Status: DC
  Filled 2020-06-22: qty 30, 30d supply, fill #0
  Filled 2020-07-27: qty 30, 30d supply, fill #1
  Filled 2020-09-10: qty 30, 30d supply, fill #2

## 2020-06-22 MED ORDER — AMITRIPTYLINE HCL 50 MG PO TABS
ORAL_TABLET | Freq: Every day | ORAL | 1 refills | Status: DC
  Filled 2020-06-22: qty 90, 90d supply, fill #0
  Filled 2020-09-29: qty 90, 90d supply, fill #1

## 2020-06-23 MED ORDER — CYCLOBENZAPRINE HCL 5 MG PO TABS
5.0000 mg | ORAL_TABLET | Freq: Three times a day (TID) | ORAL | 1 refills | Status: DC | PRN
  Filled 2020-06-23: qty 90, 30d supply, fill #0
  Filled 2020-09-29: qty 90, 30d supply, fill #1

## 2020-06-24 ENCOUNTER — Other Ambulatory Visit: Payer: Self-pay

## 2020-07-28 ENCOUNTER — Other Ambulatory Visit: Payer: Self-pay

## 2020-08-05 ENCOUNTER — Other Ambulatory Visit: Payer: Self-pay

## 2020-08-29 ENCOUNTER — Telehealth: Payer: 59 | Admitting: Nurse Practitioner

## 2020-08-29 DIAGNOSIS — J0100 Acute maxillary sinusitis, unspecified: Secondary | ICD-10-CM

## 2020-08-29 MED ORDER — AZITHROMYCIN 250 MG PO TABS: ORAL_TABLET | ORAL | 0 refills | Status: DC

## 2020-08-29 NOTE — Progress Notes (Signed)
E-Visit for Sinus Problems  We are sorry that you are not feeling well.  Here is how we plan to help!  Based on what you have shared with me it looks like you have sinusitis.  Sinusitis is inflammation and infection in the sinus cavities of the head.  Based on your presentation I believe you most likely have Acute Bacterial Sinusitis.  This is an infection caused by bacteria and is treated with antibiotics. I have prescribed zpac.. You should not need steroids at this time.   You may use an oral decongestant such as Mucinex D or if you have glaucoma or high blood pressure use plain Mucinex. Saline nasal spray help and can safely be used as often as needed for congestion.  If you develop worsening sinus pain, fever or notice severe headache and vision changes, or if symptoms are not better after completion of antibiotic, please schedule an appointment with a health care provider.    Sinus infections are not as easily transmitted as other respiratory infection, however we still recommend that you avoid close contact with loved ones, especially the very young and elderly.  Remember to wash your hands thoroughly throughout the day as this is the number one way to prevent the spread of infection!  Home Care: Only take medications as instructed by your medical team. Complete the entire course of an antibiotic. Do not take these medications with alcohol. A steam or ultrasonic humidifier can help congestion.  You can place a towel over your head and breathe in the steam from hot water coming from a faucet. Avoid close contacts especially the very young and the elderly. Cover your mouth when you cough or sneeze. Always remember to wash your hands.  Get Help Right Away If: You develop worsening fever or sinus pain. You develop a severe head ache or visual changes. Your symptoms persist after you have completed your treatment plan.  Make sure you Understand these instructions. Will watch your  condition. Will get help right away if you are not doing well or get worse.  Thank you for choosing an e-visit.  Your e-visit answers were reviewed by a board certified advanced clinical practitioner to complete your personal care plan. Depending upon the condition, your plan could have included both over the counter or prescription medications.  Please review your pharmacy choice. Make sure the pharmacy is open so you can pick up prescription now. If there is a problem, you may contact your provider through [employer] and have the prescription routed to another pharmacy.  Your safety is important to [place]. If you have drug allergies check your prescription carefully.   For the next 24 hours you can use MyChart to ask questions about today's visit, request a non-urgent call back, or ask for a work or school excuse. You will get an email in the next two days asking about your experience. I hope that your e-visit has been valuable and will speed your recovery.  5-10 minutes spent reviewing and documenting in chart.

## 2020-09-11 ENCOUNTER — Other Ambulatory Visit: Payer: Self-pay

## 2020-09-29 ENCOUNTER — Other Ambulatory Visit: Payer: Self-pay

## 2020-09-30 ENCOUNTER — Other Ambulatory Visit: Payer: Self-pay

## 2020-10-30 ENCOUNTER — Encounter: Payer: Self-pay | Admitting: Internal Medicine

## 2020-10-30 ENCOUNTER — Telehealth (INDEPENDENT_AMBULATORY_CARE_PROVIDER_SITE_OTHER): Payer: 59 | Admitting: Internal Medicine

## 2020-10-30 DIAGNOSIS — R519 Headache, unspecified: Secondary | ICD-10-CM

## 2020-10-30 DIAGNOSIS — F5102 Adjustment insomnia: Secondary | ICD-10-CM

## 2020-10-30 MED ORDER — SERTRALINE HCL 50 MG PO TABS
50.0000 mg | ORAL_TABLET | Freq: Every day | ORAL | 1 refills | Status: DC
  Filled 2020-10-31: qty 90, 90d supply, fill #0

## 2020-10-30 MED ORDER — ONDANSETRON HCL 8 MG PO TABS: 8.0000 mg | ORAL_TABLET | Freq: Three times a day (TID) | ORAL | 1 refills | Status: DC | PRN

## 2020-10-30 MED ORDER — SUMATRIPTAN SUCCINATE 25 MG PO TABS
25.0000 mg | ORAL_TABLET | ORAL | 0 refills | Status: DC | PRN
  Filled 2020-10-31: qty 10, 20d supply, fill #0

## 2020-10-30 MED ORDER — BUTALBITAL-APAP-CAFFEINE 50-325-40 MG PO TABS: ORAL_TABLET | ORAL | 2 refills | Status: DC

## 2020-10-31 ENCOUNTER — Other Ambulatory Visit (HOSPITAL_COMMUNITY): Payer: Self-pay

## 2020-11-01 NOTE — Assessment & Plan Note (Signed)
Refilling fioricet for prn use and introducing imitrex for migraine

## 2020-11-01 NOTE — Progress Notes (Signed)
Virtual Visit via Caregility Note  This visit type was conducted due to national recommendations for restrictions regarding the COVID-19 pandemic (e.g. social distancing).  This format is felt to be most appropriate for this patient at this time.  All issues noted in this document were discussed and addressed.  No physical exam was performed (except for noted visual exam findings with Video Visits).   I connected withNAME@ on 11/01/20 at  4:30 PM EDT by a video enabled telemedicine application and verified that I am speaking with the correct person using two identifiers. Location patient: home Location provider: work or home office Persons participating in the virtual visit: patient, provider  I discussed the limitations, risks, security and privacy concerns of performing an evaluation and management service by telephone and the availability of in person appointments. I also discussed with the patient that there may be a patient responsible charge related to this service. The patient expressed understanding and agreed to proceed.  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  She cites several stressors, including the recent passing oh her beloved dog of many years,  and the potentially serious diagnosis her young daughter is awaiting after undergoing a skin biopsy of a suspicious mole.  Some of her headaches have been true migraines,  but some of the headaches have been more stress related and classic for tension headaches.     ROS: See pertinent positives and negatives per HPI.  Past Medical History:  Diagnosis Date   Bronchitis due to COVID-19 virus 11/02/2018   Easy bruising 01/12/2018   Frequent headaches    Headache, chronic daily 09/18/2015   Heart murmur    Migraine    Preeclampsia    Psoriasis     Past Surgical History:  Procedure Laterality Date   CESAREAN SECTION  2015    CHOLECYSTECTOMY  2015    Family History  Problem Relation Age of Onset   Arthritis Mother    Anxiety disorder Mother    Arthritis Father    Hyperlipidemia Father    Hypertension Father    Depression Father    Anxiety disorder Father    Hodgkin's lymphoma Father    Heart disease Father    Endometriosis Sister    Psoriasis Brother    Diabetes Paternal Aunt    Arthritis Maternal Grandmother    Cancer Maternal Grandmother        ovarian and uterine cancer, breast cancer   Heart disease Maternal Grandmother    Stroke Maternal Grandmother    Hypertension Maternal Grandmother    Cancer Paternal Grandmother        colon cancer   Alcohol abuse Paternal Grandfather     SOCIAL HX:  reports that she has never smoked. She has never used smokeless tobacco. She reports that she does not drink alcohol and does not use drugs.    Current Outpatient Medications:    albuterol (PROVENTIL HFA) 108 (90 Base) MCG/ACT inhaler, Inhale 2 puffs into the lungs every 6 (six) hours as needed for wheezing or shortness of breath., Disp: 18 g, Rfl: 1   ALPRAZolam (XANAX) 0.25 MG tablet, TAKE 1 TABLET BY MOUTH EACH NIGHT AT BEDTIME AS NEEDED FOR SLEEP, Disp: 30 tablet, Rfl: 5   amitriptyline (ELAVIL) 50 MG tablet, TAKE 1 TABLET BY MOUTH AT BEDTIME., Disp: 90 tablet, Rfl: 1   clobetasol (TEMOVATE) 0.05 % external solution, , Disp: ,  Rfl: 3   cyclobenzaprine (FLEXERIL) 5 MG tablet, Take 1 tablet (5 mg total) by mouth 3 (three) times daily as needed for muscle spasms., Disp: 90 tablet, Rfl: 1   diltiazem (CARDIZEM LA) 240 MG 24 hr tablet, TAKE 1 TABLET BY MOUTH DAILY., Disp: 30 tablet, Rfl: 2   loratadine (CLARITIN) 10 MG tablet, Take 10 mg by mouth daily., Disp: , Rfl:    sertraline (ZOLOFT) 50 MG tablet, Take 1 tablet (50 mg total) by mouth daily., Disp: 90 tablet, Rfl: 1   SUMAtriptan (IMITREX) 25 MG tablet, Take 1 tablet (25 mg total) by mouth as needed for migraine. May repeat in 2 hours if headache persists  or recurs., Disp: 10 tablet, Rfl: 0   Adapalene 0.3 % gel, , Disp: , Rfl: 2   butalbital-acetaminophen-caffeine (FIORICET) 50-325-40 MG tablet, TAKE 1 TO 2 TABLETS BY MOUTH EVERY 6 HOURS AS NEEDED FOR HEADACHE. MAX OF 4 TABLETS DAILY., Disp: 60 tablet, Rfl: 2   HYDROcodone-acetaminophen (NORCO) 10-325 MG tablet, Take 1 tablet by mouth every 6 (six) hours as needed. (Patient not taking: Reported on 10/30/2020), Disp: 30 tablet, Rfl: 0   ondansetron (ZOFRAN) 8 MG tablet, Take 1 tablet (8 mg total) by mouth every 8 (eight) hours as needed for nausea or vomiting., Disp: 30 tablet, Rfl: 1  EXAM:  VITALS per patient if applicable:  GENERAL: alert, oriented, appears well and in no acute distress  HEENT: atraumatic, conjunttiva clear, no obvious abnormalities on inspection of external nose and ears  NECK: normal movements of the head and neck  LUNGS: on inspection no signs of respiratory distress, breathing rate appears normal, no obvious gross SOB, gasping or wheezing  CV: no obvious cyanosis  MS: moves all visible extremities without noticeable abnormality  PSYCH/NEURO: pleasant and cooperative, no obvious depression or anxiety, speech and thought processing grossly intact  ASSESSMENT AND PLAN:  Discussed the following assessment and plan:  Headache disorder  Insomnia due to psychological stress  Headache disorder Refilling fioricet for prn use and introducing imitrex for migraine   Insomnia due to psychological stress Starting sertraline for management of anxiety related insomnia     I discussed the assessment and treatment plan with the patient. The patient was provided an opportunity to ask questions and all were answered. The patient agreed with the plan and demonstrated an understanding of the instructions.   The patient was advised to call back or seek an in-person evaluation if the symptoms worsen or if the condition fails to improve as anticipated.   I spent 30 minutes  dedicated to the care of this patient on the date of this encounter to include pre-visit review of  her medical history,  Face-to-face time with the patient , including counselling  and post visit ordering of  therapeutics.    Sherlene Shams, MD

## 2020-11-01 NOTE — Assessment & Plan Note (Signed)
Starting sertraline for management of anxiety related insomnia

## 2020-11-03 ENCOUNTER — Other Ambulatory Visit: Payer: Self-pay

## 2020-11-03 ENCOUNTER — Other Ambulatory Visit: Payer: Self-pay | Admitting: Internal Medicine

## 2020-11-03 MED FILL — Diltiazem HCl Coated Beads Tab ER 24HR 240 MG: ORAL | 30 days supply | Qty: 30 | Fill #0 | Status: AC

## 2020-11-04 ENCOUNTER — Other Ambulatory Visit (HOSPITAL_COMMUNITY): Payer: Self-pay

## 2020-11-04 MED ORDER — BUTALBITAL-APAP-CAFFEINE 50-325-40 MG PO TABS
ORAL_TABLET | ORAL | 2 refills | Status: DC
  Filled 2020-11-04: qty 60, 15d supply, fill #0

## 2020-11-04 NOTE — Telephone Encounter (Signed)
Please cancel the fioricet that was sent to [facility] ; it has been resent to [place]

## 2020-11-05 NOTE — Telephone Encounter (Signed)
Medication canceled at [facility]

## 2020-11-12 ENCOUNTER — Other Ambulatory Visit (HOSPITAL_COMMUNITY): Payer: Self-pay

## 2020-11-19 ENCOUNTER — Other Ambulatory Visit: Payer: Self-pay | Admitting: Internal Medicine

## 2020-11-19 ENCOUNTER — Telehealth: Payer: Self-pay | Admitting: Internal Medicine

## 2020-11-19 ENCOUNTER — Other Ambulatory Visit: Payer: Self-pay

## 2020-11-19 NOTE — Telephone Encounter (Signed)
RX Refill: xanax Last Seen: 10-30-20 Last Ordered: 05-08-20 Next Appt: NA

## 2020-11-20 ENCOUNTER — Other Ambulatory Visit: Payer: Self-pay

## 2020-11-20 MED ORDER — ALPRAZOLAM 0.25 MG PO TABS
ORAL_TABLET | ORAL | 5 refills | Status: DC
Start: 2020-11-20 — End: 2021-06-22
  Filled 2020-11-20: qty 30, 30d supply, fill #0
  Filled 2021-01-12: qty 30, 30d supply, fill #1
  Filled 2021-02-16: qty 30, 30d supply, fill #2
  Filled 2021-03-31: qty 30, 30d supply, fill #3
  Filled 2021-05-14: qty 30, 30d supply, fill #4

## 2020-11-20 MED ORDER — BUTALBITAL-APAP-CAFFEINE 50-325-40 MG PO TABS
1.0000 | ORAL_TABLET | Freq: Four times a day (QID) | ORAL | 2 refills | Status: DC | PRN
  Filled 2020-11-20: qty 60, 15d supply, fill #0
  Filled 2021-04-18: qty 60, 15d supply, fill #1

## 2020-11-20 NOTE — Telephone Encounter (Signed)
Alprazolam sent to [facility]

## 2020-11-20 NOTE — Telephone Encounter (Signed)
[facility] pharmacy and verified that they received the prescription.

## 2020-11-20 NOTE — Telephone Encounter (Signed)
Lori Douglas from [facility] called stating they sent over a refill request for alprazolam 0.25mg  tablet and it was denied due the refill being sent as a duplicate. Pharmacy states they didn't receive the request at all.

## 2020-11-20 NOTE — Addendum Note (Signed)
Addended by: Sherlene Shams on: 11/20/2020 01:37 PM   Modules accepted: Orders

## 2020-11-30 ENCOUNTER — Telehealth: Payer: 59 | Admitting: Physician Assistant

## 2020-11-30 DIAGNOSIS — J020 Streptococcal pharyngitis: Secondary | ICD-10-CM | POA: Diagnosis not present

## 2020-12-01 ENCOUNTER — Other Ambulatory Visit: Payer: Self-pay

## 2020-12-01 MED ORDER — CEPHALEXIN 500 MG PO CAPS
500.0000 mg | ORAL_CAPSULE | Freq: Two times a day (BID) | ORAL | 0 refills | Status: DC
  Filled 2020-12-01: qty 20, 10d supply, fill #0

## 2020-12-01 MED ORDER — FLUCONAZOLE 150 MG PO TABS
150.0000 mg | ORAL_TABLET | Freq: Once | ORAL | 0 refills | Status: AC
  Filled 2020-12-01: qty 1, 1d supply, fill #0

## 2020-12-01 NOTE — Progress Notes (Signed)
E-Visit for Sore Throat - Strep Symptoms  We are sorry that you are not feeling well.  Here is how we plan to help!  Based on what you have shared with me it is likely that you have strep pharyngitis.  Strep pharyngitis is inflammation and infection in the back of the throat.  This is an infection cause by bacteria and is treated with antibiotics.  I have prescribed Cephalexin 500 mg twice a day for 10 days. For throat pain, we recommend over the counter oral pain relief medications such as acetaminophen or aspirin, or anti-inflammatory medications such as ibuprofen or naproxen sodium. Topical treatments such as oral throat lozenges or sprays may be used as needed. Strep infections are not as easily transmitted as other respiratory infections, however we still recommend that you avoid close contact with loved ones, especially the very young and elderly.  Remember to wash your hands thoroughly throughout the day as this is the number one way to prevent the spread of infection and wipe down door knobs and counters with disinfectant.   Home Care: Only take medications as instructed by your medical team. Complete the entire course of an antibiotic. Do not take these medications with alcohol. A steam or ultrasonic humidifier can help congestion.  You can place a towel over your head and breathe in the steam from hot water coming from a faucet. Avoid close contacts especially the very young and the elderly. Cover your mouth when you cough or sneeze. Always remember to wash your hands.  Get Help Right Away If: You develop worsening fever or sinus pain. You develop a severe head ache or visual changes. Your symptoms persist after you have completed your treatment plan.  Make sure you Understand these instructions. Will watch your condition. Will get help right away if you are not doing well or get worse.   Thank you for choosing an e-visit.  Your e-visit answers were reviewed by a board  certified advanced clinical practitioner to complete your personal care plan. Depending upon the condition, your plan could have included both over the counter or prescription medications.  Please review your pharmacy choice. Make sure the pharmacy is open so you can pick up prescription now. If there is a problem, you may contact your provider through MyChart messaging and have the prescription routed to another pharmacy.  Your safety is important to us. If you have drug allergies check your prescription carefully.   For the next 24 hours you can use MyChart to ask questions about today's visit, request a non-urgent call back, or ask for a work or school excuse. You will get an email in the next two days asking about your experience. I hope that your e-visit has been valuable and will speed your recovery.  I provided 5 minutes of non face-to-face time during this encounter for chart review and documentation.   

## 2020-12-01 NOTE — Addendum Note (Signed)
Addended by: Margaretann Loveless on: 12/01/2020 07:46 AM   Modules accepted: Orders

## 2020-12-17 ENCOUNTER — Other Ambulatory Visit: Payer: Self-pay

## 2020-12-17 ENCOUNTER — Encounter: Payer: Self-pay | Admitting: Internal Medicine

## 2020-12-17 MED FILL — Diltiazem HCl Coated Beads Tab ER 24HR 240 MG: ORAL | 30 days supply | Qty: 30 | Fill #1 | Status: AC

## 2020-12-17 NOTE — Telephone Encounter (Signed)
I do not see documentation in the last office note that states pt is to increase the amitriptyline to 100 mg daily. If this is okay I will send in prescription.

## 2020-12-19 ENCOUNTER — Telehealth: Payer: 59 | Admitting: Nurse Practitioner

## 2020-12-19 DIAGNOSIS — J40 Bronchitis, not specified as acute or chronic: Secondary | ICD-10-CM | POA: Diagnosis not present

## 2020-12-19 MED ORDER — BENZONATATE 100 MG PO CAPS: 100.0000 mg | ORAL_CAPSULE | Freq: Three times a day (TID) | ORAL | 0 refills | Status: AC | PRN

## 2020-12-19 MED ORDER — FLUCONAZOLE 150 MG PO TABS: 150.0000 mg | ORAL_TABLET | Freq: Once | ORAL | 0 refills | Status: AC

## 2020-12-19 MED ORDER — AZITHROMYCIN 250 MG PO TABS: ORAL_TABLET | ORAL | 0 refills | Status: AC

## 2020-12-19 MED ORDER — ALBUTEROL SULFATE HFA 108 (90 BASE) MCG/ACT IN AERS: 2.0000 | INHALATION_SPRAY | Freq: Four times a day (QID) | RESPIRATORY_TRACT | 0 refills | Status: DC | PRN

## 2020-12-19 MED ORDER — PREDNISONE 10 MG PO TABS: 10.0000 mg | ORAL_TABLET | Freq: Two times a day (BID) | ORAL | 0 refills | Status: AC

## 2020-12-19 NOTE — Addendum Note (Signed)
Addended by: Bennie Pierini on: 12/19/2020 08:21 AM   Modules accepted: Orders

## 2020-12-19 NOTE — Progress Notes (Signed)
We are sorry that you are not feeling well.  Here is how we plan to help!  Based on your presentation I believe you most likely have A cough due to bacteria.  When patients have a fever and a productive cough with a change in color or increased sputum production, we are concerned about bacterial bronchitis.  If left untreated it can progress to pneumonia.  If your symptoms do not improve with your treatment plan it is important that you contact your provider.   I have prescribed Azithromyin 250 mg: two tablets now and then one tablet daily for 4 additonal days    In addition you may use A non-prescription cough medication called Mucinex DM: take 2 tablets every 12 hours. Prednisone 10 mg twice daily for 5 days and we will also refill your Albuterol inhaler for you.   From your responses in the eVisit questionnaire you describe inflammation in the upper respiratory tract which is causing a significant cough.  This is commonly called Bronchitis and has four common causes:   Allergies Viral Infections Acid Reflux Bacterial Infection Allergies, viruses and acid reflux are treated by controlling symptoms or eliminating the cause. An example might be a cough caused by taking certain blood pressure medications. You stop the cough by changing the medication. Another example might be a cough caused by acid reflux. Controlling the reflux helps control the cough.  USE OF BRONCHODILATOR ("RESCUE") INHALERS: There is a risk from using your bronchodilator too frequently.  The risk is that over-reliance on a medication which only relaxes the muscles surrounding the breathing tubes can reduce the effectiveness of medications prescribed to reduce swelling and congestion of the tubes themselves.  Although you feel brief relief from the bronchodilator inhaler, your asthma may actually be worsening with the tubes becoming more swollen and filled with mucus.  This can delay other crucial treatments, such as oral steroid  medications. If you need to use a bronchodilator inhaler daily, several times per day, you should discuss this with your provider.  There are probably better treatments that could be used to keep your asthma under control.     HOME CARE Only take medications as instructed by your medical team. Complete the entire course of an antibiotic. Drink plenty of fluids and get plenty of rest. Avoid close contacts especially the very young and the elderly Cover your mouth if you cough or cough into your sleeve. Always remember to wash your hands A steam or ultrasonic humidifier can help congestion.   GET HELP RIGHT AWAY IF: You develop worsening fever. You become short of breath You cough up blood. Your symptoms persist after you have completed your treatment plan MAKE SURE YOU  Understand these instructions. Will watch your condition. Will get help right away if you are not doing well or get worse.    Thank you for choosing an e-visit.  Your e-visit answers were reviewed by a board certified advanced clinical practitioner to complete your personal care plan. Depending upon the condition, your plan could have included both over the counter or prescription medications.  Please review your pharmacy choice. Make sure the pharmacy is open so you can pick up prescription now. If there is a problem, you may contact your provider through [employer] and have the prescription routed to another pharmacy.  Your safety is important to [place]. If you have drug allergies check your prescription carefully.   For the next 24 hours you can use MyChart to ask questions  about today's visit, request a non-urgent call back, or ask for a work or school excuse. You will get an email in the next two days asking about your experience. I hope that your e-visit has been valuable and will speed your recovery.   I spent approximately 7 minutes reviewing the patient's history, current symptoms and coordinating their plan  of care today.    Meds ordered this encounter  Medications   azithromycin (ZITHROMAX) 250 MG tablet    Sig: Take 2 tablets on day 1, then 1 tablet daily on days 2 through 5    Dispense:  6 tablet    Refill:  0   albuterol (VENTOLIN HFA) 108 (90 Base) MCG/ACT inhaler    Sig: Inhale 2 puffs into the lungs every 6 (six) hours as needed for wheezing or shortness of breath.    Dispense:  8 g    Refill:  0   predniSONE (DELTASONE) 10 MG tablet    Sig: Take 1 tablet (10 mg total) by mouth 2 (two) times daily with a meal for 5 days.    Dispense:  10 tablet    Refill:  0

## 2020-12-19 NOTE — Addendum Note (Signed)
Addended by: Viviano Simas E on: 12/19/2020 02:23 PM   Modules accepted: Orders

## 2020-12-20 MED ORDER — AMITRIPTYLINE HCL 100 MG PO TABS
100.0000 mg | ORAL_TABLET | Freq: Every day | ORAL | 1 refills | Status: DC
  Filled 2020-12-20: qty 90, 90d supply, fill #0
  Filled 2021-03-31: qty 90, 90d supply, fill #1

## 2020-12-21 ENCOUNTER — Other Ambulatory Visit: Payer: Self-pay

## 2021-01-12 ENCOUNTER — Other Ambulatory Visit: Payer: Self-pay

## 2021-01-12 ENCOUNTER — Telehealth: Payer: 59 | Admitting: Physician Assistant

## 2021-01-12 DIAGNOSIS — R21 Rash and other nonspecific skin eruption: Secondary | ICD-10-CM

## 2021-01-12 MED ORDER — CLOTRIMAZOLE-BETAMETHASONE 1-0.05 % EX CREA
1.0000 "application " | TOPICAL_CREAM | Freq: Two times a day (BID) | CUTANEOUS | 0 refills | Status: DC
  Filled 2021-01-12: qty 30, 15d supply, fill #0

## 2021-01-12 NOTE — Progress Notes (Signed)
I have spent 5 minutes in review of e-visit questionnaire, review and updating patient chart, medical decision making and response to patient.   Beckett Maden Cody Rukiya Hodgkins, PA-C    

## 2021-01-12 NOTE — Progress Notes (Signed)
E Visit for Rash  We are sorry that you are not feeling well. Here is how we plan to help!  Based upon your presentation it appears you could have a mild fungal rash (ring worm). The larger area is rounded in shape and the borders seem to be redder and have a raised appearance. It is hard to tell as this could be a mild atopic dermatitis as well. To cover bother,  I have prescribed: Lotrisone cream to apply twice daily for up to 2 weeks.    HOME CARE:  Take cool showers and avoid direct sunlight. Apply cool compress or wet dressings. Take a bath in an oatmeal bath.  Sprinkle content of one Aveeno packet under running faucet with comfortably warm water.  Bathe for 15-20 minutes, 1-2 times daily.  Pat dry with a towel. Do not rub the rash. Use hydrocortisone cream. Take an antihistamine like Benadryl for widespread rashes that itch.  The adult dose of Benadryl is 25-50 mg by mouth 4 times daily. Caution:  This type of medication may cause sleepiness.  Do not drink alcohol, drive, or operate dangerous machinery while taking antihistamines.  Do not take these medications if you have prostate enlargement.  Read package instructions thoroughly on all medications that you take.  GET HELP RIGHT AWAY IF:  Symptoms don't go away after treatment. Severe itching that persists. If you rash spreads or swells. If you rash begins to smell. If it blisters and opens or develops a yellow-brown crust. You develop a fever. You have a sore throat. You become short of breath.  MAKE SURE YOU:  Understand these instructions. Will watch your condition. Will get help right away if you are not doing well or get worse.  Thank you for choosing an e-visit.  Your e-visit answers were reviewed by a board certified advanced clinical practitioner to complete your personal care plan. Depending upon the condition, your plan could have included both over the counter or prescription medications.  Please review your  pharmacy choice. Make sure the pharmacy is open so you can pick up prescription now. If there is a problem, you may contact your provider through [employer] and have the prescription routed to another pharmacy.  Your safety is important to [place]. If you have drug allergies check your prescription carefully.   For the next 24 hours you can use MyChart to ask questions about today's visit, request a non-urgent call back, or ask for a work or school excuse. You will get an email in the next two days asking about your experience. I hope that your e-visit has been valuable and will speed your recovery.

## 2021-01-14 ENCOUNTER — Telehealth: Payer: Self-pay | Admitting: Internal Medicine

## 2021-01-14 MED ORDER — SERTRALINE HCL 50 MG PO TABS: 50.0000 mg | ORAL_TABLET | Freq: Every day | ORAL | 0 refills | Status: DC

## 2021-01-14 MED ORDER — AMITRIPTYLINE HCL 100 MG PO TABS: 100.0000 mg | ORAL_TABLET | Freq: Every day | ORAL | 0 refills | Status: DC

## 2021-01-14 NOTE — Telephone Encounter (Signed)
Pt called in stating she needs an emergency call in for amitriptyline and sertraline sent In to [facility]   [address] [employer] phone number [phone]

## 2021-01-14 NOTE — Telephone Encounter (Signed)
Spoke with pt to let her know that the medication has been sent in. Pt stated that as soon as she left the message she found her medication. Advised that I had already sent in the rxs and that she would need to call to cancel them.

## 2021-01-20 ENCOUNTER — Other Ambulatory Visit: Payer: Self-pay

## 2021-01-20 DIAGNOSIS — Z7689 Persons encountering health services in other specified circumstances: Secondary | ICD-10-CM | POA: Diagnosis not present

## 2021-01-20 DIAGNOSIS — L300 Nummular dermatitis: Secondary | ICD-10-CM | POA: Diagnosis not present

## 2021-01-20 DIAGNOSIS — L400 Psoriasis vulgaris: Secondary | ICD-10-CM | POA: Diagnosis not present

## 2021-01-20 MED ORDER — TRIAMCINOLONE ACETONIDE 0.1 % EX CREA
TOPICAL_CREAM | CUTANEOUS | 2 refills | Status: DC
  Filled 2021-01-20: qty 80, 30d supply, fill #0

## 2021-01-22 ENCOUNTER — Other Ambulatory Visit: Payer: Self-pay

## 2021-01-22 MED FILL — Diltiazem HCl Coated Beads Tab ER 24HR 240 MG: ORAL | 30 days supply | Qty: 30 | Fill #2 | Status: AC

## 2021-01-23 ENCOUNTER — Telehealth: Payer: 59 | Admitting: Nurse Practitioner

## 2021-01-23 DIAGNOSIS — J069 Acute upper respiratory infection, unspecified: Secondary | ICD-10-CM

## 2021-01-23 MED ORDER — FLUTICASONE PROPIONATE 50 MCG/ACT NA SUSP: 2.0000 | Freq: Every day | NASAL | 0 refills | Status: DC

## 2021-01-23 NOTE — Progress Notes (Signed)
E-Visit for Sinus Problems  We are sorry that you are not feeling well.  Here is how we plan to help!  Based on what you have shared with me it looks like you have sinusitis.  Sinusitis is inflammation and infection in the sinus cavities of the head.  Based on your presentation I believe you most likely have Acute Viral Sinusitis.This is an infection most likely caused by a virus. There is not specific treatment for viral sinusitis other than to help you with the symptoms until the infection runs its course.  You may use an oral decongestant such as Mucinex D or if you have glaucoma or high blood pressure use plain Mucinex. Saline nasal spray help and can safely be used as often as needed for congestion, I have prescribed: Fluticasone nasal spray two sprays in each nostril once a day  Providers prescribe antibiotics to treat infections caused by bacteria. Antibiotics are very powerful in treating bacterial infections when they are used properly. To maintain their effectiveness, they should be used only when necessary. Overuse of antibiotics has resulted in the development of superbugs that are resistant to treatment!    Especially since you have multiple antibiotic allergies it is important to wait so that if you do need an antibiotic it will work when you need it.   After careful review of your answers, I would not recommend an antibiotic for your condition.  Antibiotics are not effective against viruses and therefore should not be used to treat them. Common examples of infections caused by viruses include colds, flu and COVID-19   Some authorities believe that zinc sprays or the use of Echinacea may shorten the course of your symptoms.  Sinus infections are not as easily transmitted as other respiratory infection, however we still recommend that you avoid close contact with loved ones, especially the very young and elderly.  Remember to wash your hands thoroughly throughout the day as this is the  number one way to prevent the spread of infection!  Home Care: Only take medications as instructed by your medical team. Do not take these medications with alcohol. A steam or ultrasonic humidifier can help congestion.  You can place a towel over your head and breathe in the steam from hot water coming from a faucet. Avoid close contacts especially the very young and the elderly. Cover your mouth when you cough or sneeze. Always remember to wash your hands.  Get Help Right Away If: You develop worsening fever or sinus pain. You develop a severe head ache or visual changes. Your symptoms persist after you have completed your treatment plan.  Make sure you Understand these instructions. Will watch your condition. Will get help right away if you are not doing well or get worse.   Thank you for choosing an e-visit.  Your e-visit answers were reviewed by a board certified advanced clinical practitioner to complete your personal care plan. Depending upon the condition, your plan could have included both over the counter or prescription medications.  Please review your pharmacy choice. Make sure the pharmacy is open so you can pick up prescription now. If there is a problem, you may contact your provider through [employer] and have the prescription routed to another pharmacy.  Your safety is important to [place]. If you have drug allergies check your prescription carefully.   For the next 24 hours you can use MyChart to ask questions about today's visit, request a non-urgent call back, or ask for a work  or school excuse. You will get an email in the next two days asking about your experience. I hope that your e-visit has been valuable and will speed your recovery.  I spent approximately 5  minutes reviewing the patient's history, current symptoms and coordinating their plan of care today.    Meds ordered this encounter  Medications   fluticasone (FLONASE) 50 MCG/ACT nasal spray    Sig:  Place 2 sprays into both nostrils daily.    Dispense:  16 g    Refill:  0

## 2021-01-29 ENCOUNTER — Other Ambulatory Visit: Payer: Self-pay

## 2021-02-05 ENCOUNTER — Other Ambulatory Visit: Payer: Self-pay | Admitting: Internal Medicine

## 2021-02-05 ENCOUNTER — Other Ambulatory Visit: Payer: Self-pay

## 2021-02-08 ENCOUNTER — Other Ambulatory Visit: Payer: Self-pay

## 2021-02-08 MED ORDER — ONDANSETRON 4 MG PO TBDP
ORAL_TABLET | Freq: Three times a day (TID) | ORAL | 0 refills | Status: DC | PRN
  Filled 2021-02-08: qty 20, 7d supply, fill #0

## 2021-02-08 MED ORDER — SERTRALINE HCL 50 MG PO TABS
50.0000 mg | ORAL_TABLET | Freq: Every day | ORAL | 1 refills | Status: DC
  Filled 2021-02-08: qty 90, 90d supply, fill #0
  Filled 2021-06-01: qty 90, 90d supply, fill #1

## 2021-02-10 ENCOUNTER — Other Ambulatory Visit: Payer: Self-pay | Admitting: Internal Medicine

## 2021-02-11 ENCOUNTER — Other Ambulatory Visit: Payer: Self-pay

## 2021-02-11 MED ORDER — CYCLOBENZAPRINE HCL 5 MG PO TABS
5.0000 mg | ORAL_TABLET | Freq: Three times a day (TID) | ORAL | 1 refills | Status: DC | PRN
  Filled 2021-02-11: qty 90, 30d supply, fill #0
  Filled 2021-04-18: qty 90, 30d supply, fill #1

## 2021-02-17 ENCOUNTER — Other Ambulatory Visit: Payer: Self-pay

## 2021-03-01 ENCOUNTER — Other Ambulatory Visit: Payer: Self-pay

## 2021-03-01 ENCOUNTER — Other Ambulatory Visit: Payer: Self-pay | Admitting: Internal Medicine

## 2021-03-01 MED ORDER — DILTIAZEM HCL ER COATED BEADS 240 MG PO TB24
ORAL_TABLET | Freq: Every day | ORAL | 2 refills | Status: DC
  Filled 2021-03-01: qty 30, 30d supply, fill #0
  Filled 2021-03-31: qty 30, 30d supply, fill #1

## 2021-03-02 ENCOUNTER — Other Ambulatory Visit: Payer: Self-pay

## 2021-03-31 ENCOUNTER — Other Ambulatory Visit: Payer: Self-pay

## 2021-04-01 ENCOUNTER — Other Ambulatory Visit: Payer: Self-pay

## 2021-04-18 ENCOUNTER — Other Ambulatory Visit: Payer: Self-pay | Admitting: Internal Medicine

## 2021-04-19 ENCOUNTER — Other Ambulatory Visit: Payer: Self-pay

## 2021-04-19 MED ORDER — SUMATRIPTAN SUCCINATE 25 MG PO TABS
25.0000 mg | ORAL_TABLET | ORAL | 0 refills | Status: DC | PRN
  Filled 2021-04-19: qty 9, 18d supply, fill #0
  Filled 2021-09-07: qty 9, 18d supply, fill #1

## 2021-04-28 ENCOUNTER — Telehealth: Payer: 59 | Admitting: Physician Assistant

## 2021-04-28 DIAGNOSIS — R3989 Other symptoms and signs involving the genitourinary system: Secondary | ICD-10-CM

## 2021-04-28 MED ORDER — FLUCONAZOLE 150 MG PO TABS: 150.0000 mg | ORAL_TABLET | Freq: Once | ORAL | 0 refills | Status: AC

## 2021-04-28 MED ORDER — NITROFURANTOIN MONOHYD MACRO 100 MG PO CAPS: 100.0000 mg | ORAL_CAPSULE | Freq: Two times a day (BID) | ORAL | 0 refills | Status: DC

## 2021-04-28 NOTE — Addendum Note (Signed)
Addended by: Margaretann Loveless on: 04/28/2021 08:37 AM ? ? Modules accepted: Orders ? ?

## 2021-04-28 NOTE — Progress Notes (Signed)

## 2021-05-06 ENCOUNTER — Telehealth: Payer: 59 | Admitting: Family

## 2021-05-06 DIAGNOSIS — J069 Acute upper respiratory infection, unspecified: Secondary | ICD-10-CM | POA: Diagnosis not present

## 2021-05-07 ENCOUNTER — Encounter: Payer: Self-pay | Admitting: Internal Medicine

## 2021-05-07 MED ORDER — FLUTICASONE PROPIONATE 50 MCG/ACT NA SUSP: 2.0000 | Freq: Every day | NASAL | 6 refills | Status: DC

## 2021-05-07 MED ORDER — CETIRIZINE HCL 10 MG PO TABS: 10.0000 mg | ORAL_TABLET | Freq: Every day | ORAL | 11 refills | Status: DC

## 2021-05-07 NOTE — Progress Notes (Signed)
E-Visit for Upper Respiratory Infection  ? ?We are sorry you are not feeling well.  Here is how we plan to help! ? ?Based on what you have shared with me, it looks like you may have a viral upper respiratory infection.  Upper respiratory infections are caused by a large number of viruses; however, rhinovirus is the most common cause.  ? ?Symptoms vary from person to person, with common symptoms including sore throat, cough, fatigue or lack of energy and feeling of general discomfort.  A low-grade fever of up to 100.4 may present, but is often uncommon.  Symptoms vary however, and are closely related to a person's age or underlying illnesses.  The most common symptoms associated with an upper respiratory infection are nasal discharge or congestion, cough, sneezing, headache and pressure in the ears and face.  These symptoms usually persist for about 3 to 10 days, but can last up to 2 weeks.  It is important to know that upper respiratory infections do not cause serious illness or complications in most cases.   ? ?Upper respiratory infections can be transmitted from person to person, with the most common method of transmission being a person's hands.  The virus is able to live on the skin and can infect other persons for up to 2 hours after direct contact.  Also, these can be transmitted when someone coughs or sneezes; thus, it is important to cover the mouth to reduce this risk.  To keep the spread of the illness at bay, good hand hygiene is very important. ? ?This is an infection that is most likely caused by a virus. There are no specific treatments other than to help you with the symptoms until the infection runs its course.  We are sorry you are not feeling well.  Here is how we plan to help! ? ? ?For nasal congestion, you may use an oral decongestants such as Mucinex D or if you have glaucoma or high blood pressure use plain Mucinex.  Saline nasal spray or nasal drops can help and can safely be used as often as  needed for congestion.  For your congestion, I have prescribed Fluticasone nasal spray one spray in each nostril twice a day and zyrtec  mg.  ? ?If you do not have a history of heart disease, hypertension, diabetes or thyroid disease, prostate/bladder issues or glaucoma, you may also use Sudafed to treat nasal congestion.  It is highly recommended that you consult with a pharmacist or your primary care physician to ensure this medication is safe for you to take.    ? ?If you have a cough, you may use cough suppressants such as Delsym and Robitussin.  If you have glaucoma or high blood pressure, you can also use Coricidin HBP.   ? ?If you have a sore or scratchy throat, use a saltwater gargle- ? to ? teaspoon of salt dissolved in a 4-ounce to 8-ounce glass of warm water.  Gargle the solution for approximately 15-30 seconds and then spit.  It is important not to swallow the solution.  You can also use throat lozenges/cough drops and Chloraseptic spray to help with throat pain or discomfort.  Warm or cold liquids can also be helpful in relieving throat pain. ? ?For headache, pain or general discomfort, you can use Ibuprofen or Tylenol as directed.   ?Some authorities believe that zinc sprays or the use of Echinacea may shorten the course of your symptoms. ? ? ?HOME CARE ?Only take medications as  instructed by your medical team. ?Be sure to drink plenty of fluids. Water is fine as well as fruit juices, sodas and electrolyte beverages. You may want to stay away from caffeine or alcohol. If you are nauseated, try taking small sips of liquids. How do you know if you are getting enough fluid? Your urine should be a pale yellow or almost colorless. ?Get rest. ?Taking a steamy shower or using a humidifier may help nasal congestion and ease sore throat pain. You can place a towel over your head and breathe in the steam from hot water coming from a faucet. ?Using a saline nasal spray works much the same way. ?Cough drops, hard  candies and sore throat lozenges may ease your cough. ?Avoid close contacts especially the very young and the elderly ?Cover your mouth if you cough or sneeze ?Always remember to wash your hands.  ? ?GET HELP RIGHT AWAY IF: ?You develop worsening fever. ?If your symptoms do not improve within 10 days ?You develop yellow or green discharge from your nose over 3 days. ?You have coughing fits ?You develop a severe head ache or visual changes. ?You develop shortness of breath, difficulty breathing or start having chest pain ?Your symptoms persist after you have completed your treatment plan ? ?MAKE SURE YOU  ?Understand these instructions. ?Will watch your condition. ?Will get help right away if you are not doing well or get worse. ? ?Thank you for choosing an e-visit. ? ?Your e-visit answers were reviewed by a board certified advanced clinical practitioner to complete your personal care plan. Depending upon the condition, your plan could have included both over the counter or prescription medications. ? ?Please review your pharmacy choice. Make sure the pharmacy is open so you can pick up prescription now. If there is a problem, you may contact your provider through [employer] and have the prescription routed to another pharmacy.  Your safety is important to [place]. If you have drug allergies check your prescription carefully.  ? ?For the next 24 hours you can use MyChart to ask questions about today's visit, request a non-urgent call back, or ask for a work or school excuse. ?You will get an email in the next two days asking about your experience. I hope that your e-visit has been valuable and will speed your recovery. ? ?Approximately 5 minutes was spent documenting and reviewing patient's chart.  ? ? ? ?

## 2021-05-14 ENCOUNTER — Other Ambulatory Visit: Payer: Self-pay

## 2021-06-01 ENCOUNTER — Other Ambulatory Visit: Payer: Self-pay | Admitting: Internal Medicine

## 2021-06-01 ENCOUNTER — Other Ambulatory Visit: Payer: Self-pay

## 2021-06-01 MED ORDER — DILTIAZEM HCL ER COATED BEADS 240 MG PO CP24
240.0000 mg | ORAL_CAPSULE | Freq: Every day | ORAL | 3 refills | Status: DC
  Filled 2021-06-01: qty 30, 30d supply, fill #0
  Filled 2021-07-09: qty 30, 30d supply, fill #1
  Filled 2021-08-12: qty 30, 30d supply, fill #2
  Filled 2021-09-07: qty 30, 30d supply, fill #3

## 2021-06-22 ENCOUNTER — Other Ambulatory Visit: Payer: Self-pay | Admitting: Internal Medicine

## 2021-06-22 ENCOUNTER — Other Ambulatory Visit: Payer: Self-pay

## 2021-06-22 MED ORDER — ALPRAZOLAM 0.25 MG PO TABS
ORAL_TABLET | ORAL | 0 refills | Status: DC
  Filled 2021-06-22: qty 30, 30d supply, fill #0

## 2021-06-22 NOTE — Telephone Encounter (Signed)
LMTCB to schedule an appointment. Has not been seen since 05/06/20.

## 2021-06-23 ENCOUNTER — Other Ambulatory Visit (HOSPITAL_COMMUNITY): Payer: Self-pay

## 2021-06-27 ENCOUNTER — Telehealth: Payer: 59 | Admitting: Family

## 2021-06-27 DIAGNOSIS — M546 Pain in thoracic spine: Secondary | ICD-10-CM | POA: Diagnosis not present

## 2021-06-27 MED ORDER — BACLOFEN 10 MG PO TABS: 10.0000 mg | ORAL_TABLET | Freq: Three times a day (TID) | ORAL | 0 refills | Status: DC

## 2021-06-27 MED ORDER — LIDOCAINE 5 % EX PTCH: 1.0000 | MEDICATED_PATCH | CUTANEOUS | 0 refills | Status: DC

## 2021-06-27 MED ORDER — NAPROXEN 500 MG PO TABS: 500.0000 mg | ORAL_TABLET | Freq: Two times a day (BID) | ORAL | 0 refills | Status: DC

## 2021-06-30 ENCOUNTER — Other Ambulatory Visit: Payer: Self-pay

## 2021-06-30 ENCOUNTER — Telehealth: Payer: 59 | Admitting: Physician Assistant

## 2021-06-30 DIAGNOSIS — B029 Zoster without complications: Secondary | ICD-10-CM | POA: Diagnosis not present

## 2021-06-30 MED ORDER — VALACYCLOVIR HCL 1 G PO TABS
1000.0000 mg | ORAL_TABLET | Freq: Three times a day (TID) | ORAL | 0 refills | Status: AC
Start: 2021-06-30 — End: 2021-07-07
  Filled 2021-06-30: qty 21, 7d supply, fill #0

## 2021-06-30 MED ORDER — GABAPENTIN 100 MG PO CAPS
100.0000 mg | ORAL_CAPSULE | Freq: Three times a day (TID) | ORAL | 0 refills | Status: DC
  Filled 2021-06-30: qty 30, 10d supply, fill #0

## 2021-06-30 NOTE — Progress Notes (Signed)
Virtual Visit Consent   Lori Douglas, you are scheduled for a virtual visit with a [facility] provider today. Just as with appointments in the office, your consent must be obtained to participate. Your consent will be active for this visit and any virtual visit you may have with one of our providers in the next 365 days. If you have a MyChart account, a copy of this consent can be sent to you electronically.  As this is a virtual visit, video technology does not allow for your provider to perform a traditional examination. This may limit your provider's ability to fully assess your condition. If your provider identifies any concerns that need to be evaluated in person or the need to arrange testing (such as labs, EKG, etc.), we will make arrangements to do so. Although advances in technology are sophisticated, we cannot ensure that it will always work on either your end or our end. If the connection with a video visit is poor, the visit may have to be switched to a telephone visit. With either a video or telephone visit, we are not always able to ensure that we have a secure connection.  By engaging in this virtual visit, you consent to the provision of healthcare and authorize for your insurance to be billed (if applicable) for the services provided during this visit. Depending on your insurance coverage, you may receive a charge related to this service.  I need to obtain your verbal consent now. Are you willing to proceed with your visit today? Lori Douglas has provided verbal consent on 06/30/2021 for a virtual visit (video or telephone). Margaretann Loveless, PA-C  Date: 06/30/2021 11:44 AM  Virtual Visit via Video Note   I, Margaretann Loveless, connected with  Lori Douglas  ([id number], [birth year]) on 06/30/21 at 11:45 AM EDT by a video-enabled telemedicine application and verified that I am speaking with the correct person using two identifiers.  Location: Patient: Virtual Visit  Location Patient: Home Provider: Virtual Visit Location Provider: Home Office   I discussed the limitations of evaluation and management by telemedicine and the availability of in person appointments. The patient expressed understanding and agreed to proceed.    History of Present Illness: Lori Douglas is a 36 y.o. who identifies as a female who was assigned female at birth, and is being seen today for back pain and rash. Completed an E-visit on Saturday for back pain. Was given muscle relaxer and naproxen. Noticed back pain was progressing and today noticed she had a rash on the same side she was having the back pain. Rash now extends unilateral from back to flank to under breast.    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   Hormone deficiency 03/12/2013    Allergies:  Allergies  Allergen Reactions   Biaxin [Clarithromycin] Other (See Comments)    Other reaction(s): Hallucination Hallucinations  But can take zithromax   Doxycycline Nausea And Vomiting   Sulfa Antibiotics Other (See Comments)   Penicillins Rash   Medications:  Current Outpatient Medications:    gabapentin (NEURONTIN) 100 MG capsule, Take 1 capsule (100 mg total)  by mouth 3 (three) times daily., Disp: 30 capsule, Rfl: 0   valACYclovir (VALTREX) 1000 MG tablet, Take 1 tablet (1,000 mg total) by mouth 3 (three) times daily for 7 days., Disp: 21 tablet, Rfl: 0   Adapalene 0.3 % gel, , Disp: , Rfl: 2   albuterol (PROVENTIL HFA) 108 (90 Base) MCG/ACT inhaler, Inhale 2 puffs into the lungs every 6 (six) hours as needed for wheezing or  shortness of breath., Disp: 18 g, Rfl: 1   albuterol (VENTOLIN HFA) 108 (90 Base) MCG/ACT inhaler, Inhale 2 puffs into the lungs every 6 (six) hours as needed for wheezing or shortness of breath., Disp: 8 g, Rfl: 0   ALPRAZolam (XANAX) 0.25 MG tablet, TAKE 1 TABLET BY MOUTH EACH NIGHT AT BEDTIME AS NEEDED FOR SLEEP, Disp: 30 tablet, Rfl: 0   amitriptyline (ELAVIL) 100 MG tablet, Take 1 tablet (100 mg total) by mouth at bedtime., Disp: 90 tablet, Rfl: 1   amitriptyline (ELAVIL) 100 MG tablet, Take 1 tablet (100 mg total) by mouth at bedtime., Disp: 30 tablet, Rfl: 0   baclofen (LIORESAL) 10 MG tablet, Take 1 tablet (10 mg total) by mouth 3 (three) times daily., Disp: 30 each, Rfl: 0   butalbital-acetaminophen-caffeine (FIORICET) 50-325-40 MG tablet, TAKE 1 TO 2 TABLETS BY MOUTH EVERY 6 HOURS AS NEEDED FOR HEADACHE. MAX OF 4 TABLETS DAILY., Disp: 60 tablet, Rfl: 2   butalbital-acetaminophen-caffeine (FIORICET) 50-325-40 MG tablet, Take 1-2 tablets by mouth every 6 (six) hours as needed. Max of 4 per day., Disp: 60 tablet, Rfl: 2   cephALEXin (KEFLEX) 500 MG capsule, Take 1 capsule (500 mg total) by mouth 2 (two) times daily., Disp: 20 capsule, Rfl: 0   cetirizine (ZYRTEC) 10 MG tablet, Take 1 tablet (10 mg total) by mouth daily., Disp: 30 tablet, Rfl: 11   clobetasol (TEMOVATE) 0.05 % external solution, , Disp: , Rfl: 3   clotrimazole-betamethasone (LOTRISONE) cream, Apply 1 application topically 2 (two) times daily., Disp: 30 g, Rfl: 0   cyclobenzaprine (FLEXERIL) 5 MG tablet, Take 1 tablet (5 mg total) by mouth 3 (three) times daily as needed for muscle spasms., Disp: 90 tablet, Rfl: 1   diltiazem (CARDIZEM CD) 240 MG 24 hr capsule, Take 1 capsule (240 mg total) by mouth daily., Disp: 30 capsule, Rfl: 3   fluticasone (FLONASE) 50 MCG/ACT nasal spray, Place 2 sprays into both nostrils daily., Disp: 16 g, Rfl: 6   HYDROcodone-acetaminophen (NORCO) 10-325 MG tablet, Take 1 tablet by mouth every 6 (six)  hours as needed. (Patient not taking: Reported on 10/30/2020), Disp: 30 tablet, Rfl: 0   lidocaine (LIDODERM) 5 %, Place 1 patch onto the skin daily. Remove & Discard patch within 12 hours or as directed by MD, Disp: 30 patch, Rfl: 0   loratadine (CLARITIN) 10 MG tablet, Take 10 mg by mouth daily., Disp: , Rfl:    naproxen (NAPROSYN) 500 MG tablet, Take 1 tablet (500 mg total) by mouth 2 (two) times daily with a meal., Disp: 30 tablet, Rfl: 0   nitrofurantoin, macrocrystal-monohydrate, (MACROBID) 100 MG capsule, Take 1 capsule (100 mg total) by mouth 2 (two) times daily., Disp: 10 capsule, Rfl: 0   ondansetron (ZOFRAN) 8 MG tablet, Take 1 tablet (8 mg total) by mouth every 8 (eight) hours as needed for nausea or vomiting., Disp: 30 tablet, Rfl: 1   ondansetron (ZOFRAN-ODT) 4 MG disintegrating tablet, TAKE 1 TABLET (4 MG TOTAL) BY MOUTH EVERY 8 (EIGHT) HOURS AS NEEDED FOR NAUSEA  OR VOMITING., Disp: 20 tablet, Rfl: 0   sertraline (ZOLOFT) 50 MG tablet, Take 1 tablet (50 mg total) by mouth daily., Disp: 30 tablet, Rfl: 0   sertraline (ZOLOFT) 50 MG tablet, Take 1 tablet (50 mg total) by mouth daily., Disp: 90 tablet, Rfl: 1   SUMAtriptan (IMITREX) 25 MG tablet, Take 1 tablet (25 mg total) by mouth as needed for migraine. May repeat in 2 hours if headache persists or recurs., Disp: 10 tablet, Rfl: 0   triamcinolone cream (KENALOG) 0.1 %, 1(one) application(s) topical 2(two) times a day, Disp: 80 g, Rfl: 2  Observations/Objective: Patient is well-developed, well-nourished in no acute distress.  Resting comfortably at home.  Head is normocephalic, atraumatic.  No labored breathing.  Speech is clear and coherent with logical content.  Patient is alert and oriented at baseline.    Assessment and Plan: 1. Herpes zoster without complication - valACYclovir (VALTREX) 1000 MG tablet; Take 1 tablet (1,000 mg total) by mouth 3 (three) times daily for 7 days.  Dispense: 21 tablet; Refill: 0 - gabapentin  (NEURONTIN) 100 MG capsule; Take 1 capsule (100 mg total) by mouth 3 (three) times daily.  Dispense: 30 capsule; Refill: 0  - Valtrex for shingles - Gabapentin for nerve pain, drowsiness precautions discussed - Advised could still use lidocaine as well as needed - Tylenol as needed  - Discussed isolation needs and population to avoid until rash crusts over - Seek further evaluation if worsening, not improving, or signs of infection develop  Follow Up Instructions: I discussed the assessment and treatment plan with the patient. The patient was provided an opportunity to ask questions and all were answered. The patient agreed with the plan and demonstrated an understanding of the instructions.  A copy of instructions were sent to the patient via MyChart unless otherwise noted below.    The patient was advised to call back or seek an in-person evaluation if the symptoms worsen or if the condition fails to improve as anticipated.  Time:  I spent 10 minutes with the patient via telehealth technology discussing the above problems/concerns.    Margaretann Loveless, PA-C

## 2021-06-30 NOTE — Patient Instructions (Signed)
Lori Douglas, thank you for joining Margaretann Loveless, PA-C for today's virtual visit.  While this provider is not your primary care provider (PCP), if your PCP is located in our provider database this encounter information will be shared with them immediately following your visit.  Consent: (Patient) Lori Douglas provided verbal consent for this virtual visit at the beginning of the encounter.  Current Medications:  Current Outpatient Medications:    gabapentin (NEURONTIN) 100 MG capsule, Take 1 capsule (100 mg total) by mouth 3 (three) times daily., Disp: 30 capsule, Rfl: 0   valACYclovir (VALTREX) 1000 MG tablet, Take 1 tablet (1,000 mg total) by mouth 3 (three) times daily for 7 days., Disp: 21 tablet, Rfl: 0   Adapalene 0.3 % gel, , Disp: , Rfl: 2   albuterol (PROVENTIL HFA) 108 (90 Base) MCG/ACT inhaler, Inhale 2 puffs into the lungs every 6 (six) hours as needed for wheezing or shortness of breath., Disp: 18 g, Rfl: 1   albuterol (VENTOLIN HFA) 108 (90 Base) MCG/ACT inhaler, Inhale 2 puffs into the lungs every 6 (six) hours as needed for wheezing or shortness of breath., Disp: 8 g, Rfl: 0   ALPRAZolam (XANAX) 0.25 MG tablet, TAKE 1 TABLET BY MOUTH EACH NIGHT AT BEDTIME AS NEEDED FOR SLEEP, Disp: 30 tablet, Rfl: 0   amitriptyline (ELAVIL) 100 MG tablet, Take 1 tablet (100 mg total) by mouth at bedtime., Disp: 90 tablet, Rfl: 1   amitriptyline (ELAVIL) 100 MG tablet, Take 1 tablet (100 mg total) by mouth at bedtime., Disp: 30 tablet, Rfl: 0   baclofen (LIORESAL) 10 MG tablet, Take 1 tablet (10 mg total) by mouth 3 (three) times daily., Disp: 30 each, Rfl: 0   butalbital-acetaminophen-caffeine (FIORICET) 50-325-40 MG tablet, TAKE 1 TO 2 TABLETS BY MOUTH EVERY 6 HOURS AS NEEDED FOR HEADACHE. MAX OF 4 TABLETS DAILY., Disp: 60 tablet, Rfl: 2   butalbital-acetaminophen-caffeine (FIORICET) 50-325-40 MG tablet, Take 1-2 tablets by mouth every 6 (six) hours as needed. Max of 4 per day.,  Disp: 60 tablet, Rfl: 2   cephALEXin (KEFLEX) 500 MG capsule, Take 1 capsule (500 mg total) by mouth 2 (two) times daily., Disp: 20 capsule, Rfl: 0   cetirizine (ZYRTEC) 10 MG tablet, Take 1 tablet (10 mg total) by mouth daily., Disp: 30 tablet, Rfl: 11   clobetasol (TEMOVATE) 0.05 % external solution, , Disp: , Rfl: 3   clotrimazole-betamethasone (LOTRISONE) cream, Apply 1 application topically 2 (two) times daily., Disp: 30 g, Rfl: 0   cyclobenzaprine (FLEXERIL) 5 MG tablet, Take 1 tablet (5 mg total) by mouth 3 (three) times daily as needed for muscle spasms., Disp: 90 tablet, Rfl: 1   diltiazem (CARDIZEM CD) 240 MG 24 hr capsule, Take 1 capsule (240 mg total) by mouth daily., Disp: 30 capsule, Rfl: 3   fluticasone (FLONASE) 50 MCG/ACT nasal spray, Place 2 sprays into both nostrils daily., Disp: 16 g, Rfl: 6   HYDROcodone-acetaminophen (NORCO) 10-325 MG tablet, Take 1 tablet by mouth every 6 (six) hours as needed. (Patient not taking: Reported on 10/30/2020), Disp: 30 tablet, Rfl: 0   lidocaine (LIDODERM) 5 %, Place 1 patch onto the skin daily. Remove & Discard patch within 12 hours or as directed by MD, Disp: 30 patch, Rfl: 0   loratadine (CLARITIN) 10 MG tablet, Take 10 mg by mouth daily., Disp: , Rfl:    naproxen (NAPROSYN) 500 MG tablet, Take 1 tablet (500 mg total) by mouth 2 (two) times daily with  a meal., Disp: 30 tablet, Rfl: 0   nitrofurantoin, macrocrystal-monohydrate, (MACROBID) 100 MG capsule, Take 1 capsule (100 mg total) by mouth 2 (two) times daily., Disp: 10 capsule, Rfl: 0   ondansetron (ZOFRAN) 8 MG tablet, Take 1 tablet (8 mg total) by mouth every 8 (eight) hours as needed for nausea or vomiting., Disp: 30 tablet, Rfl: 1   ondansetron (ZOFRAN-ODT) 4 MG disintegrating tablet, TAKE 1 TABLET (4 MG TOTAL) BY MOUTH EVERY 8 (EIGHT) HOURS AS NEEDED FOR NAUSEA OR VOMITING., Disp: 20 tablet, Rfl: 0   sertraline (ZOLOFT) 50 MG tablet, Take 1 tablet (50 mg total) by mouth daily., Disp: 30  tablet, Rfl: 0   sertraline (ZOLOFT) 50 MG tablet, Take 1 tablet (50 mg total) by mouth daily., Disp: 90 tablet, Rfl: 1   SUMAtriptan (IMITREX) 25 MG tablet, Take 1 tablet (25 mg total) by mouth as needed for migraine. May repeat in 2 hours if headache persists or recurs., Disp: 10 tablet, Rfl: 0   triamcinolone cream (KENALOG) 0.1 %, 1(one) application(s) topical 2(two) times a day, Disp: 80 g, Rfl: 2   Medications ordered in this encounter:  Meds ordered this encounter  Medications   valACYclovir (VALTREX) 1000 MG tablet    Sig: Take 1 tablet (1,000 mg total) by mouth 3 (three) times daily for 7 days.    Dispense:  21 tablet    Refill:  0    Order Specific Question:   Supervising Provider    Answer:   MILLER, BRIAN [[id number]]   gabapentin (NEURONTIN) 100 MG capsule    Sig: Take 1 capsule (100 mg total) by mouth 3 (three) times daily.    Dispense:  30 capsule    Refill:  0    Order Specific Question:   Supervising Provider    Answer:   Hyacinth Meeker, BRIAN [[id number]]     *If you need refills on other medications prior to your next appointment, please contact your pharmacy*  Follow-Up: Call back or seek an in-person evaluation if the symptoms worsen or if the condition fails to improve as anticipated.  Other Instructions Shingles  Shingles, which is also known as herpes zoster, is an infection that causes a painful skin rash and fluid-filled blisters. It is caused by a virus. Shingles only develops in people who: Have had chickenpox. Have been vaccinated against chickenpox. Shingles is rare in this group. What are the causes? Shingles is caused by varicella-zoster virus. This is the same virus that causes chickenpox. After a person is exposed to the virus, it stays in the body in an inactive (dormant) state. Shingles develops if the virus is reactivated. This can happen many years after the first (initial) exposure to the virus. It is not known what causes this virus to be reactivated. What  increases the risk? People who have had chickenpox or received the chickenpox vaccine are at risk for shingles. Shingles infection is more common in people who: Are older than 34 years of age. Have a weakened disease-fighting system (immune system), such as people with: HIV (human immunodeficiency virus). AIDS (acquired immunodeficiency syndrome). Cancer. Are taking medicines that weaken the immune system, such as organ transplant medicines. Are experiencing a lot of stress. What are the signs or symptoms? Early symptoms of this condition include itching, tingling, and pain in an area on your skin. Pain may be described as burning, stabbing, or throbbing. A few days or weeks after early symptoms start, a painful red rash appears. The rash is usually on  one side of the body and has a band-like or belt-like pattern. The rash eventually turns into fluid-filled blisters that break open, change into scabs, and dry up in about 2-3 weeks. At any time during the infection, you may also develop: A fever. Chills. A headache. Nausea. How is this diagnosed? This condition is diagnosed with a skin exam. Skin or fluid samples (a culture) may be taken from the blisters before a diagnosis is made. How is this treated? The rash may last for several weeks. There is not a specific cure for this condition. Your health care provider may prescribe medicines to help you manage pain, recover more quickly, and avoid long-term problems. Medicines may include: Antiviral medicines. Anti-inflammatory medicines. Pain medicines. Anti-itching medicines (antihistamines). If the area involved is on your face, you may be referred to a specialist, such as an eye doctor (ophthalmologist) or an ear, nose, and throat (ENT) doctor (otorhinolaryngologist) to help you avoid eye problems, chronic pain, or disability. Follow these instructions at home: Medicines Take over-the-counter and prescription medicines only as told by your  health care provider. Apply an anti-itch cream or numbing cream to the affected area as told by your health care provider. Relieving itching and discomfort  Apply cold, wet cloths (cold compresses) to the area of the rash or blisters as told by your health care provider. Cool baths can be soothing. Try adding baking soda or dry oatmeal to the water to reduce itching. Do not bathe in hot water. Use calamine lotion as recommended by your health care provider. This is an over-the-counter lotion that helps to relieve itchiness. Blister and rash care Keep your rash covered with a loose bandage (dressing). Wear loose-fitting clothing to help ease the pain of material rubbing against the rash. Wash your hands with soap and water for at least 20 seconds before and after you change your dressing. If soap and water are not available, use hand sanitizer. Change your dressing as told by your health care provider. Keep your rash and blisters clean by washing the area with mild soap and cool water as told by your health care provider. Check your rash every day for signs of infection. Check for: More redness, swelling, or pain. Fluid or blood. Warmth. Pus or a bad smell. Do not scratch your rash or pick at your blisters. To help avoid scratching: Keep your fingernails clean and cut short. Wear gloves or mittens while you sleep, if scratching is a problem. General instructions Rest as told by your health care provider. Wash your hands often with soap and water for at least 20 seconds. If soap and water are not available, use hand sanitizer. Doing this lowers your chance of getting a bacterial skin infection. Before your blisters change into scabs, your shingles infection can cause chickenpox in people who have never had it or have never been vaccinated against it. To prevent this from happening, avoid contact with other people, especially: Babies. Pregnant women. Children who have eczema. Older people who  have transplants. People who have chronic illnesses, such as cancer or AIDS. Keep all follow-up visits. This is important. How is this prevented? Getting vaccinated is the best way to prevent shingles and protect against shingles complications. If you have not been vaccinated, talk with your health care provider about getting the vaccine. Where to find more information Centers for Disease Control and Prevention: [employer] Contact a health care provider if: Your pain is not relieved with prescribed medicines. Your pain does not get better  after the rash heals. You have any of these signs of infection: More redness, swelling, or pain around the rash. Fluid or blood coming from the rash. Warmth coming from your rash. Pus or a bad smell coming from the rash. A fever. Get help right away if: The rash is on your face or nose. You have facial pain, pain around your eye area, or loss of feeling on one side of your face. You have difficulty seeing. You have ear pain or have ringing in your ear. You have a loss of taste. Your condition gets worse. Summary Shingles, also known as herpes zoster, is an infection that causes a painful skin rash and fluid-filled blisters. This condition is diagnosed with a skin exam. Skin or fluid samples (a culture) may be taken from the blisters. Keep your rash covered with a loose bandage (dressing). Wear loose-fitting clothing to help ease the pain of material rubbing against the rash. Before your blisters change into scabs, your shingles infection can cause chickenpox in people who have never had it or have never been vaccinated against it. This information is not intended to replace advice given to you by your health care provider. Make sure you discuss any questions you have with your health care provider. Document Revised: 12/16/2019 Document Reviewed: 12/16/2019 Elsevier Patient Education  2023 Elsevier Inc.    If you have been instructed to have an  in-person evaluation today at a local Urgent Care facility, please use the link below. It will take you to a list of all of our available [facility], including address, phone number and hours of operation. Please do not delay care.  [facility]  If you or a family member do not have a primary care provider, use the link below to schedule a visit and establish care. When you choose a [facility] primary care physician or advanced practice provider, you gain a long-term partner in health. Find a Primary Care Provider  Learn more about [facility] in-office and virtual care options: [facility] Get Care Now

## 2021-07-07 ENCOUNTER — Encounter: Payer: Self-pay | Admitting: Internal Medicine

## 2021-07-09 ENCOUNTER — Other Ambulatory Visit: Payer: Self-pay

## 2021-07-09 DIAGNOSIS — B028 Zoster with other complications: Secondary | ICD-10-CM | POA: Diagnosis not present

## 2021-07-09 MED ORDER — GABAPENTIN 100 MG PO CAPS
ORAL_CAPSULE | ORAL | 0 refills | Status: DC
  Filled 2021-07-09: qty 180, 30d supply, fill #0

## 2021-07-19 ENCOUNTER — Encounter: Payer: 59 | Admitting: Internal Medicine

## 2021-07-20 ENCOUNTER — Other Ambulatory Visit: Payer: Self-pay | Admitting: Internal Medicine

## 2021-07-21 ENCOUNTER — Other Ambulatory Visit: Payer: Self-pay

## 2021-07-21 MED ORDER — AMITRIPTYLINE HCL 100 MG PO TABS
100.0000 mg | ORAL_TABLET | Freq: Every day | ORAL | 1 refills | Status: DC
  Filled 2021-07-21 (×2): qty 90, 90d supply, fill #0
  Filled 2021-10-25 – 2021-10-26 (×3): qty 43, 43d supply, fill #1
  Filled 2021-10-26: qty 47, 47d supply, fill #1

## 2021-07-27 ENCOUNTER — Telehealth (INDEPENDENT_AMBULATORY_CARE_PROVIDER_SITE_OTHER): Payer: 59 | Admitting: Internal Medicine

## 2021-07-27 ENCOUNTER — Encounter: Payer: Self-pay | Admitting: Internal Medicine

## 2021-07-27 DIAGNOSIS — B0229 Other postherpetic nervous system involvement: Secondary | ICD-10-CM | POA: Diagnosis not present

## 2021-07-27 MED ORDER — PREDNISONE 10 MG PO TABS: ORAL_TABLET | ORAL | 0 refills | Status: DC

## 2021-07-27 MED ORDER — HYDROCODONE-ACETAMINOPHEN 10-325 MG PO TABS: 1.0000 | ORAL_TABLET | Freq: Four times a day (QID) | ORAL | 0 refills | Status: DC | PRN

## 2021-07-27 NOTE — Progress Notes (Signed)
Virtual Visit via Caregility   Note    This format is felt to be most appropriate for this patient at this time.  All issues noted in this document were discussed and addressed.  No physical exam was performed (except for noted visual exam findings with Video Visits).   I connected with Lori Douglas on 07/27/21 at  4:30 PM EDT by a video enabled telemedicine application and verified that I am speaking with the correct person using two identifiers. Location patient: home Location provider: work or home office Persons participating in the virtual visit: patient, provider  I discussed the limitations, risks, security and privacy concerns of performing an evaluation and management service by telephone and the availability of in person appointments. I also discussed with the patient that there may be a patient responsible charge related to this service. The patient expressed understanding and agreed to proceed.  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  recent outbreak of shingles presented with back pain on june 23,  developed a herpetic rash on left upper torso on June 27 . treated by Urgent Care on June 28  with valtrex and gabapentin .  Pain persistedn and On July 7 returned to [facility] for spread of shingles to left breast and mid axillary region .  Has been using  Gabapentin but dose limited by side effects of dizziness and  sedation;  used leftover Norco 10/235 which was tolerated and helped.  Now  using  lidocaine but pain is still quite severe and at times radiates to arm    ROS: See pertinent positives and negatives per HPI.  Past Medical History:  Diagnosis Date   Bronchitis due to COVID-19 virus 11/02/2018   Easy bruising 01/12/2018   Frequent headaches    Headache, chronic daily 09/18/2015   Heart murmur    Migraine    Preeclampsia    Psoriasis     Past Surgical History:  Procedure Laterality Date   CESAREAN  SECTION  2015   CHOLECYSTECTOMY  2015    Family History  Problem Relation Age of Onset   Arthritis Mother    Anxiety disorder Mother    Arthritis Father    Hyperlipidemia Father    Hypertension Father    Depression Father    Anxiety disorder Father    Hodgkin's lymphoma Father    Heart disease Father    Endometriosis Sister    Psoriasis Brother    Diabetes Paternal Aunt    Arthritis Maternal Grandmother    Cancer Maternal Grandmother        ovarian and uterine cancer, breast cancer   Heart disease Maternal Grandmother    Stroke Maternal Grandmother    Hypertension Maternal Grandmother    Cancer Paternal Grandmother        colon cancer   Alcohol abuse Paternal Grandfather     SOCIAL HX:  reports that she has never smoked. She has never used smokeless tobacco. She reports that she does not drink alcohol and does not use drugs.    Current Outpatient Medications:    albuterol (VENTOLIN HFA) 108 (90 Base) MCG/ACT inhaler, Inhale 2 puffs into the lungs every 6 (six) hours as needed for wheezing or shortness of breath., Disp: 8 g, Rfl: 0   ALPRAZolam (XANAX) 0.25 MG tablet, TAKE 1 TABLET BY MOUTH EACH NIGHT AT BEDTIME AS NEEDED FOR SLEEP, Disp: 30 tablet, Rfl: 0   amitriptyline (ELAVIL) 100  MG tablet, Take 1 tablet (100 mg total) by mouth at bedtime., Disp: 90 tablet, Rfl: 1   butalbital-acetaminophen-caffeine (FIORICET) 50-325-40 MG tablet, Take 1-2 tablets by mouth every 6 (six) hours as needed. Max of 4 per day., Disp: 60 tablet, Rfl: 2   cetirizine (ZYRTEC) 10 MG tablet, Take 1 tablet (10 mg total) by mouth daily., Disp: 30 tablet, Rfl: 11   clobetasol (TEMOVATE) 0.05 % external solution, , Disp: , Rfl: 3   clotrimazole-betamethasone (LOTRISONE) cream, Apply 1 application topically 2 (two) times daily., Disp: 30 g, Rfl: 0   cyclobenzaprine (FLEXERIL) 5 MG tablet, Take 1 tablet (5 mg total) by mouth 3 (three) times daily as needed for muscle spasms., Disp: 90 tablet, Rfl: 1    diltiazem (CARDIZEM CD) 240 MG 24 hr capsule, Take 1 capsule (240 mg total) by mouth daily., Disp: 30 capsule, Rfl: 3   fluticasone (FLONASE) 50 MCG/ACT nasal spray, Place 2 sprays into both nostrils daily., Disp: 16 g, Rfl: 6   gabapentin (NEURONTIN) 100 MG capsule, Take by mouth., Disp: 180 capsule, Rfl: 0   lidocaine (LIDODERM) 5 %, Place 1 patch onto the skin daily. Remove & Discard patch within 12 hours or as directed by MD, Disp: 30 patch, Rfl: 0   loratadine (CLARITIN) 10 MG tablet, Take 10 mg by mouth daily., Disp: , Rfl:    ondansetron (ZOFRAN-ODT) 4 MG disintegrating tablet, TAKE 1 TABLET (4 MG TOTAL) BY MOUTH EVERY 8 (EIGHT) HOURS AS NEEDED FOR NAUSEA OR VOMITING., Disp: 20 tablet, Rfl: 0   predniSONE (DELTASONE) 10 MG tablet, 6 tablets daily x 3 days,  then reduce by 1 tablet daily until gone, Disp: 33 tablet, Rfl: 0   sertraline (ZOLOFT) 50 MG tablet, Take 1 tablet (50 mg total) by mouth daily., Disp: 90 tablet, Rfl: 1   SUMAtriptan (IMITREX) 25 MG tablet, Take 1 tablet (25 mg total) by mouth as needed for migraine. May repeat in 2 hours if headache persists or recurs., Disp: 10 tablet, Rfl: 0   triamcinolone cream (KENALOG) 0.1 %, 1(one) application(s) topical 2(two) times a day, Disp: 80 g, Rfl: 2   Adapalene 0.3 % gel, , Disp: , Rfl: 2   baclofen (LIORESAL) 10 MG tablet, Take 1 tablet (10 mg total) by mouth 3 (three) times daily. (Patient not taking: Reported on 07/27/2021), Disp: 30 each, Rfl: 0   HYDROcodone-acetaminophen (NORCO) 10-325 MG tablet, Take 1 tablet by mouth every 6 (six) hours as needed., Disp: 30 tablet, Rfl: 0   naproxen (NAPROSYN) 500 MG tablet, Take 1 tablet (500 mg total) by mouth 2 (two) times daily with a meal. (Patient not taking: Reported on 07/27/2021), Disp: 30 tablet, Rfl: 0  EXAM:  VITALS per patient if applicable:  GENERAL: alert, oriented, appears well and in no acute distress  HEENT: atraumatic, conjunttiva clear, no obvious abnormalities on  inspection of external nose and ears  NECK: normal movements of the head and neck  LUNGS: on inspection no signs of respiratory distress, breathing rate appears normal, no obvious gross SOB, gasping or wheezing  CV: no obvious cyanosis  MS: moves all visible extremities without noticeable abnormality  PSYCH/NEURO: pleasant and cooperative, no obvious depression or anxiety, speech and thought processing grossly intact  Skin:  healing herpetic rash on left lateral breast and thorax   ASSESSMENT AND PLAN:  Discussed the following assessment and plan:  Post herpetic neuralgia  Post herpetic neuralgia Her pain has persisted since her initial episode of shingles June 23  despite using valtrex, gabapentin, and lidodaine patches.  Will refill norco and start prednisone taper .    I discussed the assessment and treatment plan with the patient. The patient was provided an opportunity to ask questions and all were answered. The patient agreed with the plan and demonstrated an understanding of the instructions.   The patient was advised to call back or seek an in-person evaluation if the symptoms worsen or if the condition fails to improve as anticipated.   I spent 20 minutes dedicated to the care of this patient on the date of this encounter to include pre-visit review of his medical history,  Face-to-face time with the patient , and post visit ordering of testing and therapeutics.    Sherlene Shams, MD

## 2021-07-27 NOTE — Assessment & Plan Note (Signed)
Her pain has persisted since her initial episode of shingles June 23 despite using valtrex, gabapentin, and lidodaine patches.  Will refill norco and start prednisone taper .

## 2021-08-02 ENCOUNTER — Encounter: Payer: Self-pay | Admitting: Internal Medicine

## 2021-08-03 ENCOUNTER — Other Ambulatory Visit: Payer: Self-pay

## 2021-08-03 MED ORDER — FLUCONAZOLE 150 MG PO TABS
150.0000 mg | ORAL_TABLET | Freq: Every day | ORAL | 0 refills | Status: DC
  Filled 2021-08-03: qty 2, 2d supply, fill #0

## 2021-08-12 ENCOUNTER — Other Ambulatory Visit: Payer: Self-pay | Admitting: Internal Medicine

## 2021-08-13 ENCOUNTER — Other Ambulatory Visit: Payer: Self-pay | Admitting: Internal Medicine

## 2021-08-13 ENCOUNTER — Other Ambulatory Visit: Payer: Self-pay

## 2021-08-15 ENCOUNTER — Other Ambulatory Visit: Payer: Self-pay

## 2021-08-27 ENCOUNTER — Encounter: Payer: 59 | Admitting: Internal Medicine

## 2021-09-03 ENCOUNTER — Ambulatory Visit (INDEPENDENT_AMBULATORY_CARE_PROVIDER_SITE_OTHER): Payer: 59 | Admitting: Internal Medicine

## 2021-09-03 ENCOUNTER — Other Ambulatory Visit: Payer: Self-pay

## 2021-09-03 ENCOUNTER — Encounter: Payer: Self-pay | Admitting: Internal Medicine

## 2021-09-03 VITALS — BP 120/74 | HR 106 | Temp 98.9°F | Ht 63.0 in | Wt 163.6 lb

## 2021-09-03 DIAGNOSIS — B0229 Other postherpetic nervous system involvement: Secondary | ICD-10-CM | POA: Diagnosis not present

## 2021-09-03 DIAGNOSIS — Z0000 Encounter for general adult medical examination without abnormal findings: Secondary | ICD-10-CM | POA: Diagnosis not present

## 2021-09-03 DIAGNOSIS — R5383 Other fatigue: Secondary | ICD-10-CM

## 2021-09-03 DIAGNOSIS — Z124 Encounter for screening for malignant neoplasm of cervix: Secondary | ICD-10-CM | POA: Diagnosis not present

## 2021-09-03 DIAGNOSIS — R7401 Elevation of levels of liver transaminase levels: Secondary | ICD-10-CM

## 2021-09-03 LAB — CBC WITH DIFFERENTIAL/PLATELET
Basophils Absolute: 0.1 10*3/uL (ref 0.0–0.1)
Basophils Relative: 1.1 % (ref 0.0–3.0)
Eosinophils Absolute: 0.1 10*3/uL (ref 0.0–0.7)
Eosinophils Relative: 2 % (ref 0.0–5.0)
HCT: 39.2 % (ref 36.0–46.0)
Hemoglobin: 13 g/dL (ref 12.0–15.0)
Lymphocytes Relative: 37.1 % (ref 12.0–46.0)
Lymphs Abs: 2.1 10*3/uL (ref 0.7–4.0)
MCHC: 33.1 g/dL (ref 30.0–36.0)
MCV: 89.7 fl (ref 78.0–100.0)
Monocytes Absolute: 0.3 10*3/uL (ref 0.1–1.0)
Monocytes Relative: 5.8 % (ref 3.0–12.0)
Neutro Abs: 3.1 10*3/uL (ref 1.4–7.7)
Neutrophils Relative %: 54 % (ref 43.0–77.0)
Platelets: 338 10*3/uL (ref 150.0–400.0)
RBC: 4.37 Mil/uL (ref 3.87–5.11)
RDW: 14.1 % (ref 11.5–15.5)
WBC: 5.7 10*3/uL (ref 4.0–10.5)

## 2021-09-03 LAB — COMPREHENSIVE METABOLIC PANEL
ALT: 24 U/L (ref 0–35)
AST: 14 U/L (ref 0–37)
Albumin: 4.3 g/dL (ref 3.5–5.2)
Alkaline Phosphatase: 83 U/L (ref 39–117)
BUN: 10 mg/dL (ref 6–23)
CO2: 29 mEq/L (ref 19–32)
Calcium: 9.4 mg/dL (ref 8.4–10.5)
Chloride: 103 mEq/L (ref 96–112)
Creatinine, Ser: 0.85 mg/dL (ref 0.40–1.20)
GFR: 89.73 mL/min (ref >60.00)
Glucose, Bld: 69 mg/dL — ABNORMAL LOW (ref 70–99)
Potassium: 3.8 mEq/L (ref 3.5–5.1)
Sodium: 140 mEq/L (ref 135–145)
Total Bilirubin: 0.5 mg/dL (ref 0.2–1.2)
Total Protein: 6.9 g/dL (ref 6.0–8.3)

## 2021-09-03 LAB — TSH: TSH: 2.34 u[IU]/mL (ref 0.35–5.50)

## 2021-09-03 MED ORDER — PROMETHAZINE HCL 25 MG RE SUPP
25.0000 mg | Freq: Four times a day (QID) | RECTAL | 0 refills | Status: DC | PRN
  Filled 2021-09-03: qty 12, 3d supply, fill #0

## 2021-09-03 MED ORDER — ZOSTER VAC RECOMB ADJUVANTED 50 MCG/0.5ML IM SUSR: 0.5000 mL | Freq: Once | INTRAMUSCULAR | 1 refills | Status: AC

## 2021-09-03 NOTE — Assessment & Plan Note (Signed)

## 2021-09-03 NOTE — Progress Notes (Unsigned)
The patient is here for annual preventive examination and management of other chronic and acute problems.   The risk factors are reflected in the social history.   The roster of all physicians providing medical care to patient - is listed in the Snapshot section of the chart.   Activities of daily living:  The patient is 100% independent in all ADLs: dressing, toileting, feeding as well as independent mobility   Home safety : The patient has smoke detectors in the home. They wear seatbelts.  There are no unsecured firearms at home. There is no violence in the home.    There is no risks for hepatitis, STDs or HIV. There is no   history of blood transfusion. They have no travel history to infectious disease endemic areas of the world.   The patient has seen their dentist in the last six month. They have seen their eye doctor in the last year. The patinet  denies slight hearing difficulty with regard to whispered voices and some television programs.  They have deferred audiologic testing in the last year.  They do not  have excessive sun exposure. Discussed the need for sun protection: hats, long sleeves and use of sunscreen if there is significant sun exposure.    Diet: the importance of a healthy diet is discussed. They do have a healthy diet.   The benefits of regular aerobic exercise were discussed. The patient  exercises  3 to 5 days per week  for  60 minutes.    Depression screen: there are no signs or vegative symptoms of depression- irritability, change in appetite, anhedonia, sadness/tearfullness.   The following portions of the patient's history were reviewed and updated as appropriate: allergies, current medications, past family history, past medical history,  past surgical history, past social history  and problem list.   Visual acuity was not assessed per patient preference since the patient has regular follow up with an  ophthalmologist. Hearing and body mass index were assessed and  reviewed.    During the course of the visit the patient was educated and counseled about appropriate screening and preventive services including : fall prevention , diabetes screening, nutrition counseling, colorectal cancer screening, and recommended immunizations.    Chief Complaint:   1) post herpetic neuralgia  which started June 23 .  Feels like she is getting more colds than prior to the shingles episode several months ago.  Had/  N/V from a migraine 2 weeks  ago,  vomited for 3-4 hours.   Needs phenergan suppository .     Review of Symptoms  Patient denies headache, fevers, malaise, unintentional weight loss, skin rash, eye pain, sinus congestion and sinus pain, sore throat, dysphagia,  hemoptysis , cough, dyspnea, wheezing, chest pain, palpitations, orthopnea, edema, abdominal pain, nausea, melena, diarrhea, constipation, flank pain, dysuria, hematuria, urinary  Frequency, nocturia, numbness, tingling, seizures,  Focal weakness, Loss of consciousness,  Tremor, insomnia, depression, anxiety, and suicidal ideation.    Physical Exam:  BP 120/74 (BP Location: Left Arm, Patient Position: Sitting, Cuff Size: Normal)   Pulse (!) 106   Temp 98.9 F (37.2 C) (Oral)   Ht 5\' 3"  (1.6 m)   Wt 163 lb 9.6 oz (74.2 kg)   SpO2 99%   BMI 28.98 kg/m    General appearance: alert, cooperative and appears stated age Head: Normocephalic, without obvious abnormality, atraumatic Eyes: conjunctivae/corneas clear. PERRL, EOM's intact. Fundi benign. Ears: normal TM's and external ear canals both ears Nose: Nares normal. Septum  midline. Mucosa normal. No drainage or sinus tenderness. Throat: lips, mucosa, and tongue normal; teeth and gums normal Neck: no adenopathy, no carotid bruit, no JVD, supple, symmetrical, trachea midline and thyroid not enlarged, symmetric, no tenderness/mass/nodules Lungs: clear to auscultation bilaterally Breasts: normal appearance, no masses or tenderness Heart: regular rate  and rhythm, S1, S2 normal, no murmur, click, rub or gallop Abdomen: soft, non-tender; bowel sounds normal; no masses,  no organomegaly Extremities: extremities normal, atraumatic, no cyanosis or edema Pulses: 2+ and symmetric Skin: Skin color, texture, turgor normal. No rashes or lesions Neurologic: Alert and oriented X 3, normal strength and tone. Normal symmetric reflexes. Normal coordination and gait.     Assessment and Plan:  Encounter for preventive health examination age appropriate education and counseling updated, referrals for preventative services and immunizations addressed, dietary and smoking counseling addressed, most recent labs reviewed.  I have personally reviewed and have noted:   1) the patient's medical and social history 2) The pt's use of alcohol, tobacco, and illicit drugs 3) The patient's current medications and supplements 4) Functional ability including ADL's, fall risk, home safety risk, hearing and visual impairment 5) Diet and physical activities 6) Evidence for depression or mood disorder 7) The patient's height, weight, and BMI have been recorded in the chart  I have made referrals, and provided counseling and education based on review of the above  Elevated ALT measurement All screening labs were normal  Lab Results  Component Value Date   ALT 17 06/12/2020   AST 10 06/12/2020   ALKPHOS 73 06/12/2020   BILITOT 0.6 06/12/2020     Post herpetic neuralgia Still using gabapentin 200 mg bid. Advised to wean , not stop abruptly, starting with morning dose   Updated Medication List Outpatient Encounter Medications as of 09/03/2021  Medication Sig   albuterol (VENTOLIN HFA) 108 (90 Base) MCG/ACT inhaler Inhale 2 puffs into the lungs every 6 (six) hours as needed for wheezing or shortness of breath.   ALPRAZolam (XANAX) 0.25 MG tablet TAKE 1 TABLET BY MOUTH EACH NIGHT AT BEDTIME AS NEEDED FOR SLEEP   amitriptyline (ELAVIL) 100 MG tablet Take 1 tablet  (100 mg total) by mouth at bedtime.   butalbital-acetaminophen-caffeine (FIORICET) 50-325-40 MG tablet Take 1-2 tablets by mouth every 6 (six) hours as needed. Max of 4 per day.   cetirizine (ZYRTEC) 10 MG tablet Take 1 tablet (10 mg total) by mouth daily.   clobetasol (TEMOVATE) 0.05 % external solution    cyclobenzaprine (FLEXERIL) 5 MG tablet Take 1 tablet (5 mg total) by mouth 3 (three) times daily as needed for muscle spasms.   diltiazem (CARDIZEM CD) 240 MG 24 hr capsule Take 1 capsule (240 mg total) by mouth daily.   gabapentin (NEURONTIN) 100 MG capsule Take by mouth. (Patient taking differently: Take 200 mg by mouth 3 (three) times daily.)   HYDROcodone-acetaminophen (NORCO) 10-325 MG tablet Take 1 tablet by mouth every 6 (six) hours as needed.   lidocaine (LIDODERM) 5 % Place 1 patch onto the skin daily. Remove & Discard patch within 12 hours or as directed by MD   ondansetron (ZOFRAN-ODT) 4 MG disintegrating tablet TAKE 1 TABLET (4 MG TOTAL) BY MOUTH EVERY 8 (EIGHT) HOURS AS NEEDED FOR NAUSEA OR VOMITING.   promethazine (PHENERGAN) 25 MG suppository Place 1 suppository (25 mg total) rectally every 6 (six) hours as needed for nausea or vomiting.   sertraline (ZOLOFT) 50 MG tablet Take 1 tablet (50 mg total) by mouth daily.  SUMAtriptan (IMITREX) 25 MG tablet Take 1 tablet (25 mg total) by mouth as needed for migraine. May repeat in 2 hours if headache persists or recurs.   triamcinolone cream (KENALOG) 0.1 % 1(one) application(s) topical 2(two) times a day   [EXPIRED] Zoster Vaccine Adjuvanted [facility]) injection Inject 0.5 mLs into the muscle once for 1 dose.   [DISCONTINUED] Adapalene 0.3 % gel  (Patient not taking: Reported on 05/06/2020)   [DISCONTINUED] baclofen (LIORESAL) 10 MG tablet Take 1 tablet (10 mg total) by mouth 3 (three) times daily. (Patient not taking: Reported on 07/27/2021)   [DISCONTINUED] clotrimazole-betamethasone (LOTRISONE) cream Apply 1 application topically 2  (two) times daily. (Patient not taking: Reported on 09/03/2021)   [DISCONTINUED] fluconazole (DIFLUCAN) 150 MG tablet Take 1 tablet (150 mg total) by mouth daily. (Patient not taking: Reported on 09/03/2021)   [DISCONTINUED] fluticasone (FLONASE) 50 MCG/ACT nasal spray Place 2 sprays into both nostrils daily. (Patient not taking: Reported on 09/03/2021)   [DISCONTINUED] Fluticasone-Salmeterol (ADVAIR DISKUS) 100-50 MCG/DOSE AEPB Inhale 1 puff into the lungs 2 (two) times daily.   [DISCONTINUED] loratadine (CLARITIN) 10 MG tablet Take 10 mg by mouth daily. (Patient not taking: Reported on 09/03/2021)   [DISCONTINUED] naproxen (NAPROSYN) 500 MG tablet Take 1 tablet (500 mg total) by mouth 2 (two) times daily with a meal. (Patient not taking: Reported on 07/27/2021)   [DISCONTINUED] predniSONE (DELTASONE) 10 MG tablet 6 tablets daily x 3 days,  then reduce by 1 tablet daily until gone (Patient not taking: Reported on 09/03/2021)   No facility-administered encounter medications on file as of 09/03/2021.

## 2021-09-03 NOTE — Assessment & Plan Note (Signed)
Still using gabapentin 200 mg bid. Advised to wean , not stop abruptly, starting with morning dose

## 2021-09-03 NOTE — Assessment & Plan Note (Signed)
All screening labs were normal  Lab Results  Component Value Date   ALT 17 06/12/2020   AST 10 06/12/2020   ALKPHOS 73 06/12/2020   BILITOT 0.6 06/12/2020

## 2021-09-04 LAB — LIPID PANEL W/REFLEX DIRECT LDL
Cholesterol: 127 mg/dL (ref <200)
HDL: 53 mg/dL (ref >=50)
LDL Cholesterol (Calc): 51 mg/dL (calc)
Non-HDL Cholesterol (Calc): 74 mg/dL (calc) (ref <130)
Total CHOL/HDL Ratio: 2.4 (calc) (ref <5.0)
Triglycerides: 146 mg/dL (ref <150)

## 2021-09-07 ENCOUNTER — Other Ambulatory Visit: Payer: Self-pay | Admitting: Family

## 2021-09-07 ENCOUNTER — Other Ambulatory Visit: Payer: Self-pay | Admitting: Internal Medicine

## 2021-09-07 ENCOUNTER — Other Ambulatory Visit: Payer: Self-pay

## 2021-09-07 MED FILL — Sumatriptan Succinate Tab 25 MG: ORAL | 15 days supply | Qty: 9 | Fill #0 | Status: AC

## 2021-09-08 ENCOUNTER — Other Ambulatory Visit: Payer: Self-pay

## 2021-09-08 MED ORDER — SERTRALINE HCL 50 MG PO TABS
50.0000 mg | ORAL_TABLET | Freq: Every day | ORAL | 1 refills | Status: DC
Start: 2021-09-08 — End: 2022-03-14
  Filled 2021-09-08: qty 90, 90d supply, fill #0
  Filled 2021-12-05: qty 90, 90d supply, fill #1

## 2021-09-08 MED ORDER — ONDANSETRON 4 MG PO TBDP
ORAL_TABLET | Freq: Three times a day (TID) | ORAL | 0 refills | Status: DC | PRN
  Filled 2021-09-08: qty 20, 6d supply, fill #0

## 2021-09-09 ENCOUNTER — Encounter: Payer: Self-pay | Admitting: Internal Medicine

## 2021-09-09 ENCOUNTER — Other Ambulatory Visit: Payer: Self-pay

## 2021-09-10 ENCOUNTER — Other Ambulatory Visit: Payer: Self-pay | Admitting: Internal Medicine

## 2021-09-10 ENCOUNTER — Other Ambulatory Visit: Payer: Self-pay

## 2021-09-10 NOTE — Telephone Encounter (Signed)
Xanax 0.25mg  LOV: 09/03/21 Last Refill:06/22/21 Upcoming appt: 10/08/21  Gabapentin 100 mg Filled 07/09/21

## 2021-09-12 ENCOUNTER — Other Ambulatory Visit: Payer: Self-pay | Admitting: Internal Medicine

## 2021-09-12 ENCOUNTER — Other Ambulatory Visit: Payer: Self-pay

## 2021-09-12 MED FILL — Alprazolam Tab 0.25 MG: ORAL | 30 days supply | Qty: 30 | Fill #0 | Status: AC

## 2021-09-12 MED FILL — Gabapentin Cap 100 MG: ORAL | 30 days supply | Qty: 180 | Fill #0 | Status: AC

## 2021-09-12 MED FILL — Cyclobenzaprine HCl Tab 5 MG: ORAL | 30 days supply | Qty: 90 | Fill #0 | Status: AC

## 2021-09-12 MED FILL — Butalbital-Acetaminophen-Caffeine Tab 50-325-40 MG: ORAL | 8 days supply | Qty: 60 | Fill #0 | Status: AC

## 2021-09-13 ENCOUNTER — Other Ambulatory Visit: Payer: Self-pay

## 2021-10-08 ENCOUNTER — Ambulatory Visit: Payer: 59 | Admitting: Internal Medicine

## 2021-10-14 ENCOUNTER — Telehealth: Payer: 59 | Admitting: Family Medicine

## 2021-10-14 ENCOUNTER — Other Ambulatory Visit: Payer: Self-pay

## 2021-10-14 DIAGNOSIS — B3731 Acute candidiasis of vulva and vagina: Secondary | ICD-10-CM | POA: Diagnosis not present

## 2021-10-14 MED ORDER — FLUCONAZOLE 150 MG PO TABS
150.0000 mg | ORAL_TABLET | Freq: Once | ORAL | 0 refills | Status: AC
  Filled 2021-10-14: qty 1, 1d supply, fill #0

## 2021-10-14 NOTE — Progress Notes (Signed)

## 2021-10-25 ENCOUNTER — Other Ambulatory Visit: Payer: Self-pay

## 2021-10-25 ENCOUNTER — Other Ambulatory Visit: Payer: Self-pay | Admitting: Internal Medicine

## 2021-10-25 MED ORDER — DILTIAZEM HCL ER COATED BEADS 240 MG PO CP24
240.0000 mg | ORAL_CAPSULE | Freq: Every day | ORAL | 3 refills | Status: DC
  Filled 2021-10-25: qty 30, 30d supply, fill #0
  Filled 2021-12-05: qty 30, 30d supply, fill #1
  Filled 2022-01-07: qty 30, 30d supply, fill #2
  Filled 2022-02-09: qty 30, 30d supply, fill #3

## 2021-10-26 ENCOUNTER — Other Ambulatory Visit: Payer: Self-pay

## 2021-11-04 ENCOUNTER — Ambulatory Visit: Payer: 59 | Admitting: Internal Medicine

## 2021-11-05 ENCOUNTER — Other Ambulatory Visit: Payer: Self-pay

## 2021-11-05 MED FILL — Alprazolam Tab 0.25 MG: ORAL | 30 days supply | Qty: 30 | Fill #1 | Status: AC

## 2021-11-08 ENCOUNTER — Other Ambulatory Visit: Payer: Self-pay | Admitting: Internal Medicine

## 2021-11-08 ENCOUNTER — Other Ambulatory Visit: Payer: Self-pay | Admitting: Nurse Practitioner

## 2021-11-08 ENCOUNTER — Other Ambulatory Visit: Payer: Self-pay

## 2021-11-08 DIAGNOSIS — J40 Bronchitis, not specified as acute or chronic: Secondary | ICD-10-CM

## 2021-11-08 MED ORDER — ALBUTEROL SULFATE HFA 108 (90 BASE) MCG/ACT IN AERS
2.0000 | INHALATION_SPRAY | Freq: Four times a day (QID) | RESPIRATORY_TRACT | 0 refills | Status: DC | PRN
  Filled 2021-11-08: qty 6.7, 25d supply, fill #0

## 2021-11-08 MED ORDER — CLOBETASOL PROPIONATE 0.05 % EX SOLN
Freq: Two times a day (BID) | CUTANEOUS | 3 refills | Status: DC
Start: 2021-11-08 — End: 2022-05-31
  Filled 2021-11-08: qty 50, 30d supply, fill #0
  Filled 2021-12-05: qty 50, 30d supply, fill #1
  Filled 2022-01-07: qty 50, 30d supply, fill #2
  Filled 2022-03-14: qty 50, 30d supply, fill #3

## 2021-12-05 ENCOUNTER — Other Ambulatory Visit: Payer: Self-pay | Admitting: Internal Medicine

## 2021-12-05 ENCOUNTER — Other Ambulatory Visit: Payer: Self-pay

## 2021-12-05 MED FILL — Alprazolam Tab 0.25 MG: ORAL | 30 days supply | Qty: 30 | Fill #2 | Status: AC

## 2021-12-05 MED FILL — Gabapentin Cap 100 MG: ORAL | 30 days supply | Qty: 180 | Fill #1 | Status: AC

## 2021-12-06 ENCOUNTER — Other Ambulatory Visit: Payer: Self-pay

## 2021-12-07 ENCOUNTER — Other Ambulatory Visit: Payer: Self-pay

## 2021-12-07 ENCOUNTER — Telehealth: Payer: 59 | Admitting: Physician Assistant

## 2021-12-07 DIAGNOSIS — B3731 Acute candidiasis of vulva and vagina: Secondary | ICD-10-CM

## 2021-12-07 MED ORDER — FLUCONAZOLE 150 MG PO TABS
150.0000 mg | ORAL_TABLET | Freq: Once | ORAL | 0 refills | Status: AC
  Filled 2021-12-07: qty 1, 1d supply, fill #0

## 2021-12-07 NOTE — Progress Notes (Signed)

## 2021-12-07 NOTE — Progress Notes (Signed)
I have spent 5 minutes in review of e-visit questionnaire, review and updating patient chart, medical decision making and response to patient.   Lumen Brinlee Cody Tanvir Hipple, PA-C    

## 2021-12-08 ENCOUNTER — Other Ambulatory Visit: Payer: Self-pay

## 2021-12-08 MED FILL — Ondansetron Orally Disintegrating Tab 4 MG: ORAL | 6 days supply | Qty: 20 | Fill #0 | Status: AC

## 2021-12-08 MED FILL — Sumatriptan Succinate Tab 25 MG: ORAL | 15 days supply | Qty: 9 | Fill #0 | Status: AC

## 2021-12-15 ENCOUNTER — Telehealth: Payer: 59 | Admitting: Family Medicine

## 2021-12-15 DIAGNOSIS — T3695XA Adverse effect of unspecified systemic antibiotic, initial encounter: Secondary | ICD-10-CM | POA: Diagnosis not present

## 2021-12-15 DIAGNOSIS — J019 Acute sinusitis, unspecified: Secondary | ICD-10-CM | POA: Diagnosis not present

## 2021-12-15 DIAGNOSIS — B379 Candidiasis, unspecified: Secondary | ICD-10-CM

## 2021-12-15 DIAGNOSIS — B9689 Other specified bacterial agents as the cause of diseases classified elsewhere: Secondary | ICD-10-CM | POA: Diagnosis not present

## 2021-12-16 ENCOUNTER — Other Ambulatory Visit: Payer: Self-pay

## 2021-12-16 MED ORDER — AZITHROMYCIN 250 MG PO TABS
ORAL_TABLET | ORAL | 0 refills | Status: AC
  Filled 2021-12-16: qty 6, 5d supply, fill #0

## 2021-12-16 MED ORDER — FLUCONAZOLE 150 MG PO TABS
150.0000 mg | ORAL_TABLET | Freq: Once | ORAL | 0 refills | Status: AC
  Filled 2021-12-16: qty 1, 1d supply, fill #0

## 2021-12-16 NOTE — Addendum Note (Signed)
Addended by: Freddy Finner on: 12/16/2021 07:46 AM   Modules accepted: Orders

## 2021-12-16 NOTE — Progress Notes (Signed)
E-Visit for Sinus Problems  We are sorry that you are not feeling well.  Here is how we plan to help!  Based on what you have shared with me it looks like you have sinusitis.  Sinusitis is inflammation and infection in the sinus cavities of the head.  Based on your presentation I believe you most likely have Acute Bacterial Sinusitis.  This is an infection caused by bacteria and is treated with antibiotics. I have prescribed a Z pack- as you have several allergies to first line treatment. You may use an oral decongestant such as Mucinex D or if you have glaucoma or high blood pressure use plain Mucinex. Saline nasal spray help and can safely be used as often as needed for congestion.  If you develop worsening sinus pain, fever or notice severe headache and vision changes, or if symptoms are not better after completion of antibiotic, please schedule an appointment with a health care provider.    Sinus infections are not as easily transmitted as other respiratory infection, however we still recommend that you avoid close contact with loved ones, especially the very young and elderly.  Remember to wash your hands thoroughly throughout the day as this is the number one way to prevent the spread of infection!  Home Care: Only take medications as instructed by your medical team. Complete the entire course of an antibiotic. Do not take these medications with alcohol. A steam or ultrasonic humidifier can help congestion.  You can place a towel over your head and breathe in the steam from hot water coming from a faucet. Avoid close contacts especially the very young and the elderly. Cover your mouth when you cough or sneeze. Always remember to wash your hands.  Get Help Right Away If: You develop worsening fever or sinus pain. You develop a severe head ache or visual changes. Your symptoms persist after you have completed your treatment plan.  Make sure you Understand these instructions. Will watch  your condition. Will get help right away if you are not doing well or get worse.  Thank you for choosing an e-visit.  Your e-visit answers were reviewed by a board certified advanced clinical practitioner to complete your personal care plan. Depending upon the condition, your plan could have included both over the counter or prescription medications.  Please review your pharmacy choice. Make sure the pharmacy is open so you can pick up prescription now. If there is a problem, you may contact your provider through [employer] and have the prescription routed to another pharmacy.  Your safety is important to [place]. If you have drug allergies check your prescription carefully.   For the next 24 hours you can use MyChart to ask questions about today's visit, request a non-urgent call back, or ask for a work or school excuse. You will get an email in the next two days asking about your experience. I hope that your e-visit has been valuable and will speed your recovery.  I provided 5 minutes of non face-to-face time during this encounter for chart review, medication and order placement, as well as and documentation.

## 2022-01-07 ENCOUNTER — Other Ambulatory Visit: Payer: Self-pay

## 2022-01-07 ENCOUNTER — Other Ambulatory Visit: Payer: Self-pay | Admitting: Internal Medicine

## 2022-01-07 MED FILL — Alprazolam Tab 0.25 MG: ORAL | 30 days supply | Qty: 30 | Fill #3 | Status: AC

## 2022-01-07 MED FILL — Gabapentin Cap 100 MG: ORAL | 30 days supply | Qty: 180 | Fill #2 | Status: AC

## 2022-01-07 MED FILL — Butalbital-Acetaminophen-Caffeine Tab 50-325-40 MG: ORAL | 8 days supply | Qty: 60 | Fill #1 | Status: AC

## 2022-01-07 MED FILL — Cyclobenzaprine HCl Tab 5 MG: ORAL | 30 days supply | Qty: 90 | Fill #1 | Status: AC

## 2022-01-10 ENCOUNTER — Other Ambulatory Visit: Payer: Self-pay

## 2022-01-10 MED FILL — Sumatriptan Succinate Tab 25 MG: ORAL | 30 days supply | Qty: 9 | Fill #0 | Status: AC

## 2022-01-11 ENCOUNTER — Other Ambulatory Visit: Payer: Self-pay

## 2022-01-24 ENCOUNTER — Other Ambulatory Visit: Payer: Self-pay | Admitting: Internal Medicine

## 2022-01-24 ENCOUNTER — Other Ambulatory Visit: Payer: Self-pay

## 2022-01-24 MED ORDER — AMITRIPTYLINE HCL 100 MG PO TABS
100.0000 mg | ORAL_TABLET | Freq: Every day | ORAL | 1 refills | Status: DC
  Filled 2022-01-24: qty 90, 90d supply, fill #0
  Filled 2022-04-26: qty 90, 90d supply, fill #1

## 2022-02-02 ENCOUNTER — Other Ambulatory Visit: Payer: Self-pay

## 2022-02-19 ENCOUNTER — Telehealth: Payer: Commercial Managed Care - PPO | Admitting: Family Medicine

## 2022-02-19 DIAGNOSIS — H60502 Unspecified acute noninfective otitis externa, left ear: Secondary | ICD-10-CM | POA: Diagnosis not present

## 2022-02-19 MED ORDER — CIPROFLOXACIN-DEXAMETHASONE 0.3-0.1 % OT SUSP: 4.0000 [drp] | Freq: Two times a day (BID) | OTIC | 0 refills | Status: AC

## 2022-02-19 NOTE — Progress Notes (Signed)
E Visit for Ear Pain - Swimmer's Ear  We are sorry that you are not feeling well. Here is how we plan to help!  Based on what you have shared with me it looks like you have Swimmer's Ear.  Swimmer's ear is a redness or swelling, irritation, or infection of your outer ear canal. These symptoms usually occur within a few days of swimming. Your ear canal is a tube that goes from the opening of the ear to the eardrum.  When water stays in your ear canal, germs can grow.  This is a painful condition that often happens to children and swimmers of all ages.  It is not contagious and oral antibiotics are not required to treat uncomplicated swimmer's ear.  The usual symptoms include:    Itchiness inside the ear  Redness or a sense of swelling in the ear  Pain when the ear is tugged on when pressure is placed on the ear  Pus draining from the infected ear     Ciprodex otic as directed  In certain cases, swimmer's ear may progress to a more serious bacterial infection of the middle or inner ear.  If you have a fever 102 and up and significantly worsening symptoms, this could indicate a more serious infection moving to the middle/inner and needs face to face evaluation in an office by a provider.  Your symptoms should improve over the next 3 days and should resolve in about 7 days.  Be sure to complete ALL of your prescription.  HOME CARE: Wash your hands frequently. If you are prescribed an ear drop, do not place the tip of the bottle on your ear or touch it with your fingers. You can take Acetaminophen 650 mg every 4-6 hours as needed for pain.  If pain is severe or moderate, you can apply a heating pad (set on low) or hot water bottle (wrapped in a towel) to outer ear for 20 minutes.  This will also increase drainage. Avoid ear plugs Do not go swimming until the symptoms are gone Do not use Q-tips After showers, help the water run out by tilting your head to one side.   GET HELP RIGHT AWAY  IF: Fever is over 102.2 degrees. You develop progressive ear pain or hearing loss. Ear symptoms persist longer than 3 days after treatment.  MAKE SURE YOU: Understand these instructions. Will watch your condition. Will get help right away if you are not doing well or get worse.  TO PREVENT SWIMMER'S EAR: Use a bathing cap or custom fitted swim molds to keep your ears dry. Towel off after swimming to dry your ears. Tilt your head or pull your earlobes to allow the water to escape your ear canal. If there is still water in your ears, consider using a hairdryer on the lowest setting.  Thank you for choosing an e-visit.  Your e-visit answers were reviewed by a board certified advanced clinical practitioner to complete your personal care plan. Depending upon the condition, your plan could have included both over the counter or prescription medications.  Please review your pharmacy choice. Make sure the pharmacy is open so you can pick up the prescription now. If there is a problem, you may contact your provider through [employer] and have the prescription routed to another pharmacy.  Your safety is important to [place]. If you have drug allergies check your prescription carefully.   For the next 24 hours you can use MyChart to ask questions about today's  visit, request a non-urgent call back, or ask for a work or school excuse. You will get an email with a survey after your eVisit asking about your experience. We would appreciate your feedback. I hope that your e-visit has been valuable and will aid in your recovery.    have provided 5 minutes of non face to face time during this encounter for chart review and documentation.

## 2022-03-14 ENCOUNTER — Other Ambulatory Visit: Payer: Self-pay

## 2022-03-14 ENCOUNTER — Other Ambulatory Visit: Payer: Self-pay | Admitting: Internal Medicine

## 2022-03-14 DIAGNOSIS — J40 Bronchitis, not specified as acute or chronic: Secondary | ICD-10-CM

## 2022-03-14 MED ORDER — SERTRALINE HCL 50 MG PO TABS
50.0000 mg | ORAL_TABLET | Freq: Every day | ORAL | 1 refills | Status: DC
  Filled 2022-03-14: qty 90, 90d supply, fill #0
  Filled 2022-07-09: qty 90, 90d supply, fill #1

## 2022-03-14 MED ORDER — SUMATRIPTAN SUCCINATE 25 MG PO TABS
ORAL_TABLET | ORAL | 0 refills | Status: DC
  Filled 2022-03-14: qty 9, 15d supply, fill #0

## 2022-03-14 MED ORDER — ALBUTEROL SULFATE HFA 108 (90 BASE) MCG/ACT IN AERS
2.0000 | INHALATION_SPRAY | Freq: Four times a day (QID) | RESPIRATORY_TRACT | 0 refills | Status: DC | PRN
  Filled 2022-03-14: qty 6.7, 25d supply, fill #0

## 2022-03-14 MED ORDER — GABAPENTIN 100 MG PO CAPS
200.0000 mg | ORAL_CAPSULE | Freq: Three times a day (TID) | ORAL | 2 refills | Status: DC
  Filled 2022-03-14: qty 180, 30d supply, fill #0
  Filled 2022-05-29: qty 180, 30d supply, fill #1

## 2022-03-14 MED ORDER — DILTIAZEM HCL ER COATED BEADS 240 MG PO CP24
240.0000 mg | ORAL_CAPSULE | Freq: Every day | ORAL | 3 refills | Status: DC
  Filled 2022-03-14: qty 30, 30d supply, fill #0
  Filled 2022-04-26: qty 30, 30d supply, fill #1
  Filled 2022-05-29: qty 30, 30d supply, fill #2
  Filled 2022-07-09: qty 30, 30d supply, fill #3

## 2022-03-15 ENCOUNTER — Other Ambulatory Visit: Payer: Self-pay

## 2022-03-15 MED FILL — Butalbital-Acetaminophen-Caffeine Tab 50-325-40 MG: ORAL | 8 days supply | Qty: 60 | Fill #0 | Status: AC

## 2022-03-15 NOTE — Telephone Encounter (Signed)
Refilled: 09/12/2021 Last OV: 09/03/2021 Next OV: not scheduled

## 2022-03-17 ENCOUNTER — Other Ambulatory Visit: Payer: Self-pay

## 2022-04-26 ENCOUNTER — Other Ambulatory Visit: Payer: Self-pay | Admitting: Internal Medicine

## 2022-04-26 ENCOUNTER — Telehealth: Payer: Commercial Managed Care - PPO | Admitting: Physician Assistant

## 2022-04-26 DIAGNOSIS — J020 Streptococcal pharyngitis: Secondary | ICD-10-CM

## 2022-04-26 MED ORDER — FLUCONAZOLE 150 MG PO TABS: ORAL_TABLET | ORAL | 0 refills | Status: DC

## 2022-04-26 MED ORDER — AZITHROMYCIN 250 MG PO TABS: ORAL_TABLET | ORAL | 0 refills | Status: AC

## 2022-04-26 NOTE — Telephone Encounter (Signed)
LOV: 09/03/2021  NOV: Not scheduled

## 2022-04-26 NOTE — Progress Notes (Signed)
I have spent 5 minutes in review of e-visit questionnaire, review and updating patient chart, medical decision making and response to patient.   Amyrah Pinkhasov Cody Mariabelen Pressly, PA-C    

## 2022-04-26 NOTE — Progress Notes (Signed)

## 2022-04-26 NOTE — Progress Notes (Signed)
Message sent to patient requesting further input regarding current symptoms. Awaiting patient response.  

## 2022-04-26 NOTE — Addendum Note (Signed)
Addended by: Waldon Merl on: 04/26/2022 04:04 PM   Modules accepted: Orders

## 2022-04-27 ENCOUNTER — Other Ambulatory Visit: Payer: Self-pay

## 2022-04-27 MED ORDER — ALPRAZOLAM 0.25 MG PO TABS
0.2500 mg | ORAL_TABLET | Freq: Every evening | ORAL | 0 refills | Status: DC | PRN
  Filled 2022-04-27: qty 30, 30d supply, fill #0

## 2022-05-12 ENCOUNTER — Telehealth: Payer: Commercial Managed Care - PPO | Admitting: Physician Assistant

## 2022-05-12 DIAGNOSIS — M549 Dorsalgia, unspecified: Secondary | ICD-10-CM

## 2022-05-12 NOTE — Progress Notes (Signed)
Because it is hard to truly determine cause of back pain without examination and need to differentiate possible start of a shingles flare versus muscular inflammation and spasm from all the recent coughing, I feel your condition warrants further evaluation and I recommend that you be seen in a face to face visit.   NOTE: There will be NO CHARGE for this eVisit   If you are having a true medical emergency please call 911.      For an urgent face to face visit, [place] has eight urgent care centers for your convenience:   NEW!! [facility] at [facility] Get Driving Directions [phone] [address], [address] [place], [address]    [facility] Center at [facility] Get Driving Directions [phone] [address] [place], [place] [address]   [facility] Health Urgent Care Center [facility]) Get Driving Directions [phone] [address] [place], [place] [address]  [facility] Health Urgent Care Center [facility] [place]) Get Driving Directions [phone] [address] [place],  [place]  [address]  [facility] Health Urgent Care Center [facility] - at [employer]  [phone] [address] [place],  [place] [address]   [facility] Health Urgent Care at [facility] Get Driving Directions [phone] [address] [place], [place] [address]   [facility] Health Urgent Care at [facility] Get Driving Directions  [phone] [address] [place], [place] [address]   [facility] Health Urgent Care at [facility] Directions [phone] [address], [address] [place], [place] [address]  Your MyChart E-visit questionnaire answers were reviewed by a board certified advanced clinical practitioner to complete your personal care plan based on your specific symptoms.  Thank you for using e-Visits.

## 2022-05-13 ENCOUNTER — Other Ambulatory Visit: Payer: Self-pay

## 2022-05-13 NOTE — Telephone Encounter (Signed)
Filled by a different provider Last OV: 09/03/2021 Next OV: not scheduled

## 2022-05-16 ENCOUNTER — Other Ambulatory Visit: Payer: Self-pay

## 2022-05-16 MED ORDER — LIDOCAINE 5 % EX PTCH
1.0000 | MEDICATED_PATCH | CUTANEOUS | 0 refills | Status: DC
  Filled 2022-05-16: qty 30, 30d supply, fill #0

## 2022-05-29 ENCOUNTER — Other Ambulatory Visit: Payer: Self-pay | Admitting: Internal Medicine

## 2022-05-30 ENCOUNTER — Other Ambulatory Visit: Payer: Self-pay

## 2022-05-30 ENCOUNTER — Other Ambulatory Visit: Payer: Self-pay | Admitting: Internal Medicine

## 2022-05-31 ENCOUNTER — Other Ambulatory Visit: Payer: Self-pay

## 2022-05-31 MED FILL — Sumatriptan Succinate Tab 25 MG: ORAL | 18 days supply | Qty: 9 | Fill #0 | Status: AC

## 2022-05-31 MED FILL — Clobetasol Propionate Soln 0.05%: CUTANEOUS | 30 days supply | Qty: 50 | Fill #0 | Status: AC

## 2022-06-05 IMAGING — CT CT ABD-PELV W/O CM
2 of 4 series · 16 of 46 positions shown, 18 images · non-contrast
Comparison: CT abdomen pelvis dated June 26, 2015.

CLINICAL DATA: Right flank and lower quadrant pain for the past 2
months. Microscopic hematuria.

EXAM:
CT ABDOMEN AND PELVIS WITHOUT CONTRAST
TECHNIQUE: Multidetector CT imaging of the abdomen and pelvis was performed
following the standard protocol without IV contrast.

[Series 2: routine abd/pel wo · axial · 0.67mm/px · z∈[-966,-536]mm · 13 of 94 slices shown, 15 images]
[im 4/94  soft-tissue]
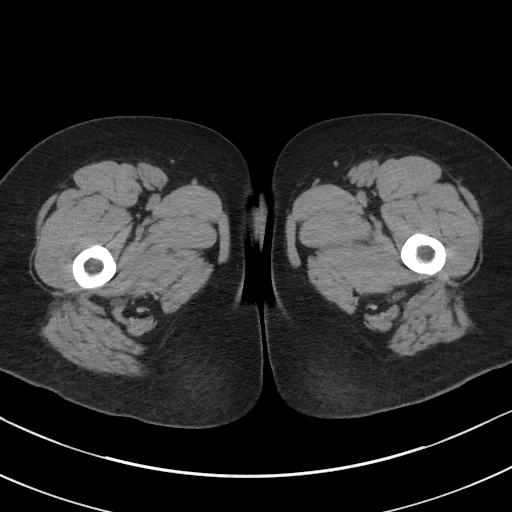
[im 4/94  bone]
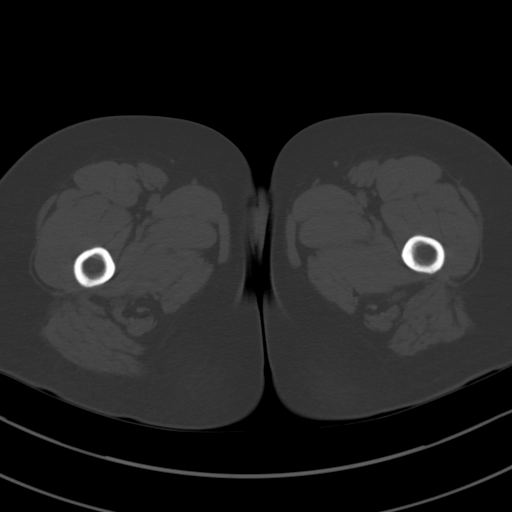
[im 12/94  soft-tissue]
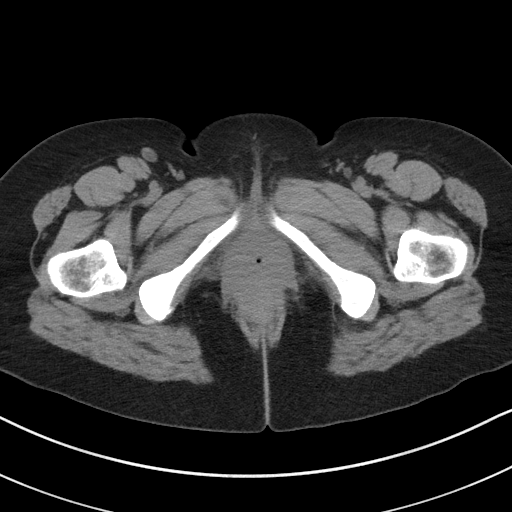
[im 19/94  soft-tissue]
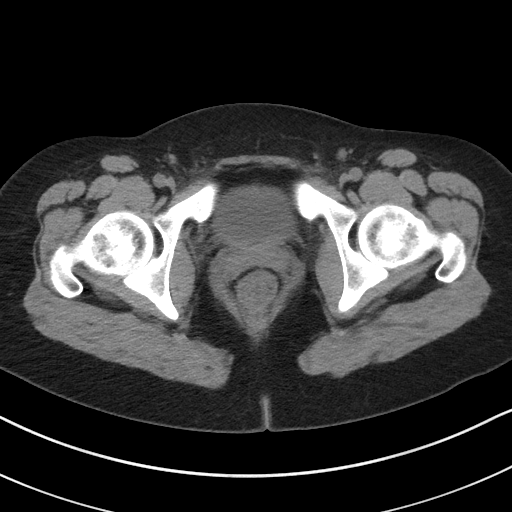
[im 27/94  soft-tissue]
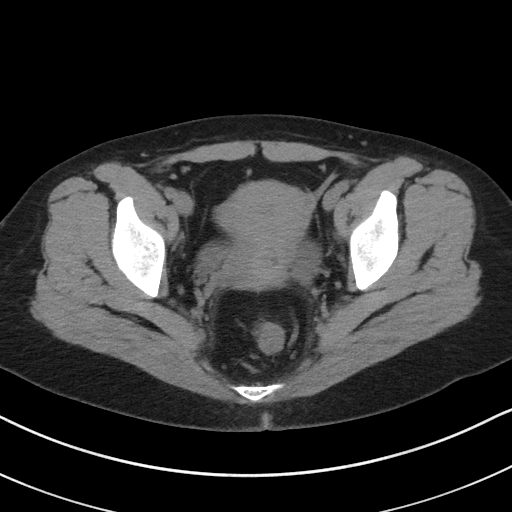
[im 34/94  soft-tissue]
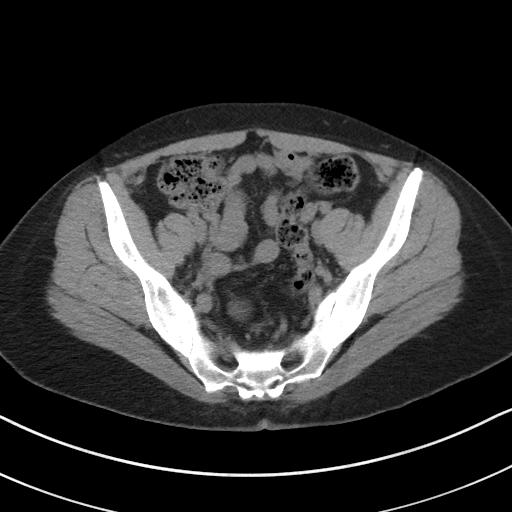
[im 41/94  soft-tissue]
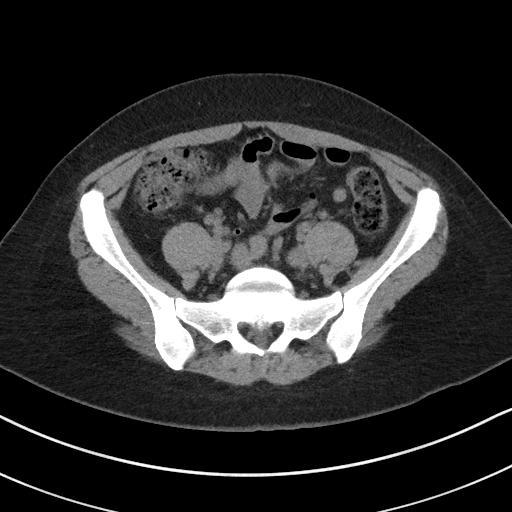
[im 49/94  soft-tissue]
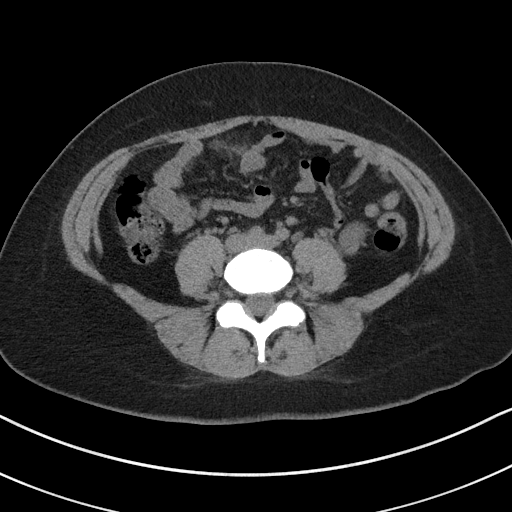
[im 53/94  soft-tissue]
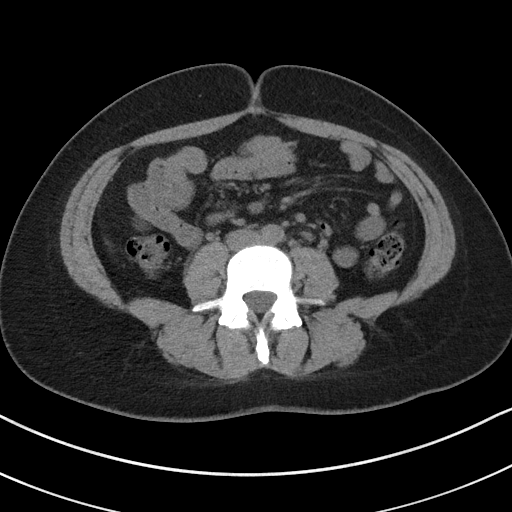
[im 60/94  soft-tissue]
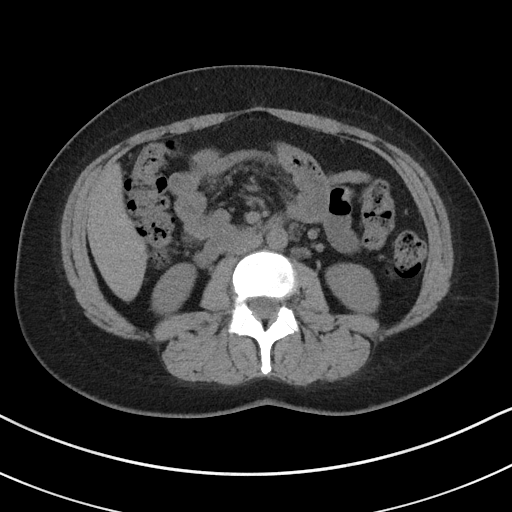
[im 60/94  bone]
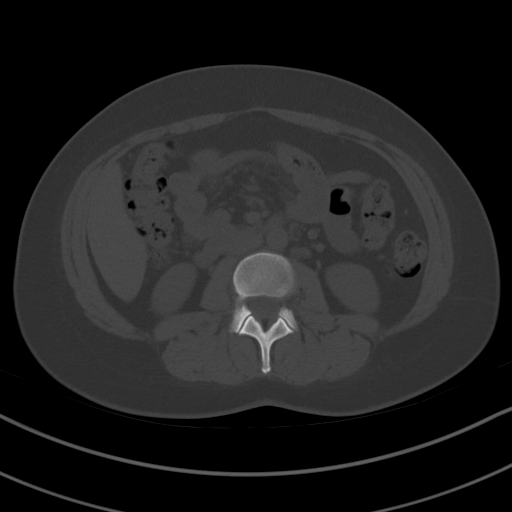
[im 67/94  soft-tissue]
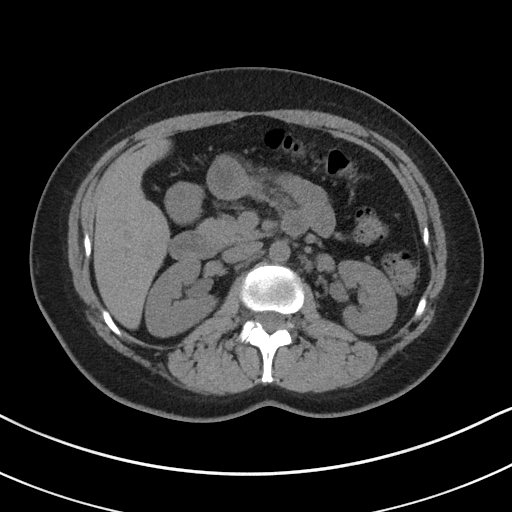
[im 75/94  soft-tissue]
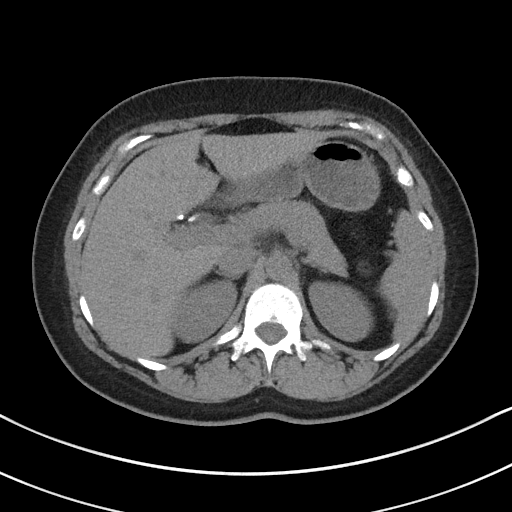
[im 82/94  soft-tissue]
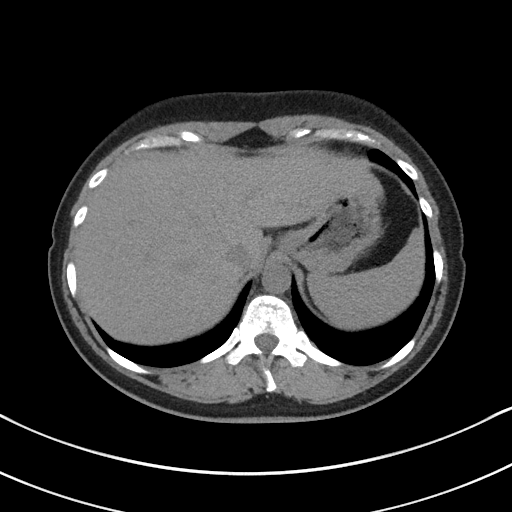
[im 90/94  soft-tissue]
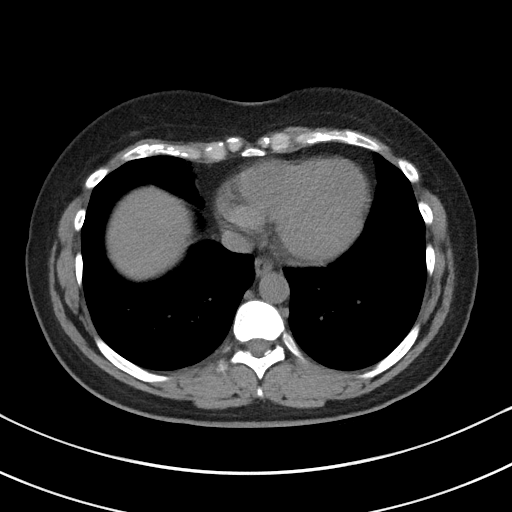

[Series 5: coronal st · coronal · 0.68mm/px · 3 of 85 slices shown]
[im 29/85  soft-tissue]
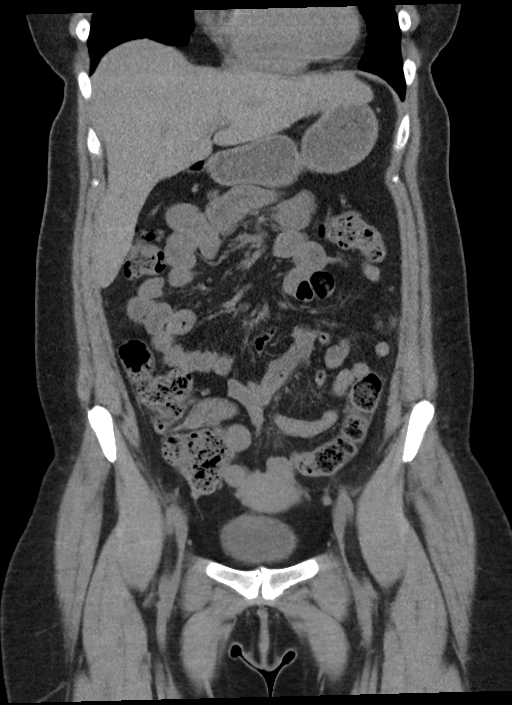
[im 38/85  soft-tissue]
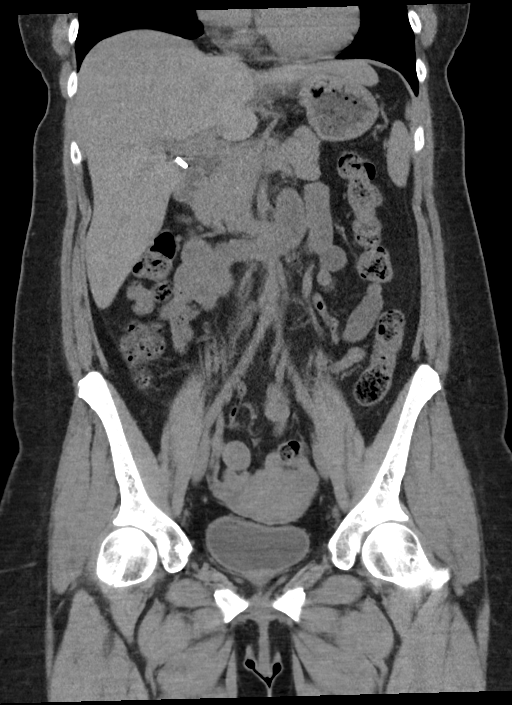
[im 47/85  soft-tissue]
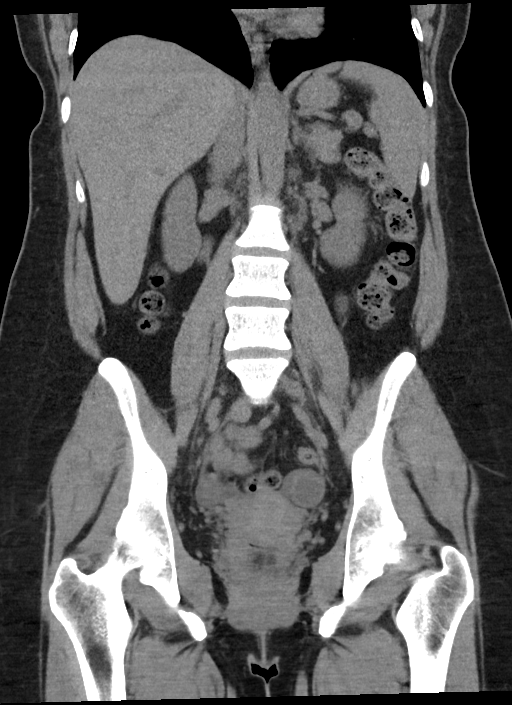

[16 of 46 positions shown; findings below may reference images not displayed]

FINDINGS: Lower chest: No acute abnormality.

Hepatobiliary: No focal liver abnormality is seen. Status post
cholecystectomy. No biliary dilatation.

Pancreas: Unremarkable. No pancreatic ductal dilatation or
surrounding inflammatory changes.

Spleen: Normal in size without focal abnormality.

Adrenals/Urinary Tract: Adrenal glands are unremarkable. Kidneys are
normal, without renal calculi, focal lesion, or hydronephrosis.
Bladder is unremarkable.

Stomach/Bowel: Stomach is within normal limits. Appendix appears
normal. No evidence of bowel wall thickening, distention, or
inflammatory changes.

Vascular/Lymphatic: No significant vascular findings are present. No
enlarged abdominal or pelvic lymph nodes.

Reproductive: Uterus and bilateral adnexa are unremarkable. Dominant
follicle in the left ovary.

Other: Trace free fluid in the pelvis, likely physiologic. No
pneumoperitoneum.

Musculoskeletal: No acute or significant osseous findings.
IMPRESSION: 1. No acute intra-abdominal process.  No urolithiasis.

## 2022-06-07 ENCOUNTER — Other Ambulatory Visit: Payer: Self-pay

## 2022-06-07 ENCOUNTER — Telehealth: Payer: Commercial Managed Care - PPO | Admitting: Internal Medicine

## 2022-06-07 ENCOUNTER — Encounter: Payer: Self-pay | Admitting: Internal Medicine

## 2022-06-07 DIAGNOSIS — B0229 Other postherpetic nervous system involvement: Secondary | ICD-10-CM | POA: Diagnosis not present

## 2022-06-07 MED ORDER — GABAPENTIN 100 MG PO CAPS
200.0000 mg | ORAL_CAPSULE | Freq: Three times a day (TID) | ORAL | 3 refills | Status: DC
  Filled 2022-07-09: qty 180, 30d supply, fill #0
  Filled 2022-09-08: qty 180, 30d supply, fill #1
  Filled 2022-10-18: qty 180, 30d supply, fill #2
  Filled 2022-12-13: qty 180, 30d supply, fill #3

## 2022-06-07 MED ORDER — SUMATRIPTAN SUCCINATE 25 MG PO TABS
ORAL_TABLET | ORAL | 0 refills | Status: DC
  Filled 2022-07-09: qty 9, 18d supply, fill #0

## 2022-06-07 MED ORDER — AMITRIPTYLINE HCL 100 MG PO TABS
100.0000 mg | ORAL_TABLET | Freq: Every day | ORAL | 1 refills | Status: DC
  Filled 2022-06-07 – 2022-09-08 (×2): qty 90, 90d supply, fill #0
  Filled 2022-12-13: qty 90, 90d supply, fill #1

## 2022-06-07 NOTE — Progress Notes (Unsigned)
Virtual Visit via Caregility   Note   This format is felt to be most appropriate for this patient at this time.  All issues noted in this document were discussed and addressed.  No physical exam was performed (except for noted visual exam findings with Video Visits).   I connected with Jazelyn  on 06/07/22 at  3:00 PM EDT by a video enabled telemedicine application or telephone and verified that I am speaking with the correct person using two identifiers. Location patient: home Location provider: work or home office Persons participating in the virtual visit: patient, provider  I discussed the limitations, risks, security and privacy concerns of performing an evaluation and management service by telephone and the availability of in person appointments. I also discussed with the patient that there may be a patient responsible charge related to this service. The patient expressed understanding and agreed to proceed.  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  Pain is still present 24/7,  moderate in intensity  but aggravated by cold temperatures and certain constricting garments (bra) .  Using lidocaine patches and gabapentin  averaging 200 mg daily but pain not controlled most days .  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    ROS: See pertinent positives and negatives per HPI.  Past Medical History:  Diagnosis Date   Bronchitis due to COVID-19 virus 11/02/2018   Easy bruising 01/12/2018   Frequent headaches    Headache, chronic daily 09/18/2015   Heart murmur    Migraine    Preeclampsia    Psoriasis     Past Surgical History:  Procedure Laterality Date   CESAREAN SECTION  2015   CHOLECYSTECTOMY  2015    Family History  Problem Relation Age of Onset    Arthritis Mother    Anxiety disorder Mother    Arthritis Father    Hyperlipidemia Father    Hypertension Father    Depression Father    Anxiety disorder Father    Hodgkin's lymphoma Father    Heart disease Father    Endometriosis Sister    Psoriasis Brother    Diabetes Paternal Aunt    Arthritis Maternal Grandmother    Cancer Maternal Grandmother        ovarian and uterine cancer, breast cancer   Heart disease Maternal Grandmother    Stroke Maternal Grandmother    Hypertension Maternal Grandmother    Cancer Paternal Grandmother        colon cancer   Alcohol abuse Paternal Grandfather     SOCIAL HX: ***   Current Outpatient Medications:    albuterol (VENTOLIN HFA) 108 (90 Base) MCG/ACT inhaler, Inhale 2 puffs into the lungs every 6 (six) hours as needed for wheezing or shortness of breath., Disp: 6.7 g, Rfl: 0   ALPRAZolam (XANAX) 0.25 MG tablet, Take 1 tablet (0.25 mg total) by mouth at bedtime as needed for anxiety. LAST REFILL WITHOUT IN OFFICE VISIT , PLEASE SCHEDULE, Disp: 30 tablet, Rfl: 0   amitriptyline (ELAVIL) 100 MG tablet, Take 1 tablet (100 mg total) by mouth at bedtime., Disp: 90 tablet, Rfl: 1   butalbital-acetaminophen-caffeine (FIORICET) 50-325-40 MG tablet, Take 1-2 tablets by mouth every 6 (six) hours as needed., Disp: 60 tablet, Rfl: 2  clobetasol (TEMOVATE) 0.05 % external solution, Apply topically 2 (two) times daily., Disp: 50 mL, Rfl: 3   cyclobenzaprine (FLEXERIL) 5 MG tablet, Take 1 tablet (5 mg total) by mouth 3 (three) times daily as needed for muscle spasms., Disp: 90 tablet, Rfl: 2   diltiazem (CARDIZEM CD) 240 MG 24 hr capsule, Take 1 capsule (240 mg total) by mouth daily., Disp: 30 capsule, Rfl: 3   gabapentin (NEURONTIN) 100 MG capsule, Take 2 capsules (200 mg total) by mouth 3 (three) times daily., Disp: 180 capsule, Rfl: 2   lidocaine (LIDODERM) 5 %, Place 1 patch onto the skin daily. Remove & Discard patch within 12 hours or as directed by MD,  Disp: 30 patch, Rfl: 0   ondansetron (ZOFRAN-ODT) 4 MG disintegrating tablet, TAKE 1 TABLET (4 MG TOTAL) BY MOUTH EVERY 8 (EIGHT) HOURS AS NEEDED FOR NAUSEA OR VOMITING., Disp: 20 tablet, Rfl: 0   promethazine (PHENERGAN) 25 MG suppository, Place 1 suppository (25 mg total) rectally every 6 (six) hours as needed for nausea or vomiting., Disp: 12 each, Rfl: 0   sertraline (ZOLOFT) 50 MG tablet, Take 1 tablet (50 mg total) by mouth daily., Disp: 90 tablet, Rfl: 1   SUMAtriptan (IMITREX) 25 MG tablet, Take 1 tablet (25 mg total) by mouth as needed for migraine. May repeat in 2 hours if headache persists or recurs., Disp: 9 tablet, Rfl: 0  EXAM:  VITALS per patient if applicable:  GENERAL: alert, oriented, appears well and in no acute distress  HEENT: atraumatic, conjunttiva clear, no obvious abnormalities on inspection of external nose and ears  NECK: normal movements of the head and neck  LUNGS: on inspection no signs of respiratory distress, breathing rate appears normal, no obvious gross SOB, gasping or wheezing  CV: no obvious cyanosis  MS: moves all visible extremities without noticeable abnormality  PSYCH/NEURO: pleasant and cooperative, no obvious depression or anxiety, speech and thought processing grossly intact  ASSESSMENT AND PLAN: There are no diagnoses linked to this encounter.    I discussed the assessment and treatment plan with the patient. The patient was provided an opportunity to ask questions and all were answered. The patient agreed with the plan and demonstrated an understanding of the instructions.   The patient was advised to call back or seek an in-person evaluation if the symptoms worsen or if the condition fails to improve as anticipated.   I spent 30 minutes dedicated to the care of this patient on the date of this encounter to include pre-visit review of his medical history,  Face-to-face time with the patient , and post visit ordering of testing and  therapeutics.    Sherlene Shams, MD

## 2022-06-08 ENCOUNTER — Other Ambulatory Visit: Payer: Self-pay

## 2022-06-09 ENCOUNTER — Encounter: Payer: Self-pay | Admitting: Internal Medicine

## 2022-06-09 NOTE — Assessment & Plan Note (Signed)
Continue gabapentin 200 mg two times daily and lidocaine topical

## 2022-06-12 ENCOUNTER — Telehealth: Payer: Commercial Managed Care - PPO | Admitting: Family

## 2022-06-12 DIAGNOSIS — R399 Unspecified symptoms and signs involving the genitourinary system: Secondary | ICD-10-CM

## 2022-06-12 MED ORDER — CEPHALEXIN 500 MG PO CAPS: 500.0000 mg | ORAL_CAPSULE | Freq: Two times a day (BID) | ORAL | 0 refills | Status: DC

## 2022-06-12 MED ORDER — FLUCONAZOLE 150 MG PO TABS: 150.0000 mg | ORAL_TABLET | ORAL | 0 refills | Status: DC | PRN

## 2022-06-12 NOTE — Progress Notes (Signed)
E-Visit for Urinary Problems  We are sorry that you are not feeling well.  Here is how we plan to help!  Based on what you shared with me it looks like you most likely have a simple urinary tract infection.  A UTI (Urinary Tract Infection) is a bacterial infection of the bladder.  Most cases of urinary tract infections are simple to treat but a key part of your care is to encourage you to drink plenty of fluids and watch your symptoms carefully.  I have prescribed Keflex 500 mg twice a day for 7 days.  Your symptoms should gradually improve. Call us if the burning in your urine worsens, you develop worsening fever, back pain or pelvic pain or if your symptoms do not resolve after completing the antibiotic.  I have also sent in a rx for diflucan to your pharmacy.   Urinary tract infections can be prevented by drinking plenty of water to keep your body hydrated.  Also be sure when you wipe, wipe from front to back and don't hold it in!  If possible, empty your bladder every 4 hours.  HOME CARE Drink plenty of fluids Compete the full course of the antibiotics even if the symptoms resolve Remember, when you need to go.go. Holding in your urine can increase the likelihood of getting a UTI! GET HELP RIGHT AWAY IF: You cannot urinate You get a high fever Worsening back pain occurs You see blood in your urine You feel sick to your stomach or throw up You feel like you are going to pass out  MAKE SURE YOU  Understand these instructions. Will watch your condition. Will get help right away if you are not doing well or get worse.   Thank you for choosing an e-visit.  Your e-visit answers were reviewed by a board certified advanced clinical practitioner to complete your personal care plan. Depending upon the condition, your plan could have included both over the counter or prescription medications.  Please review your pharmacy choice. Make sure the pharmacy is open so you can pick up  prescription now. If there is a problem, you may contact your provider through [employer] and have the prescription routed to another pharmacy.  Your safety is important to [place]. If you have drug allergies check your prescription carefully.   For the next 24 hours you can use MyChart to ask questions about today's visit, request a non-urgent call back, or ask for a work or school excuse. You will get an email in the next two days asking about your experience. I hope that your e-visit has been valuable and will speed your recovery.  Approximately 5 minutes was spent documenting and reviewing patient's chart.

## 2022-06-13 ENCOUNTER — Telehealth: Payer: Commercial Managed Care - PPO | Admitting: Internal Medicine

## 2022-07-04 ENCOUNTER — Ambulatory Visit: Payer: Commercial Managed Care - PPO | Admitting: Internal Medicine

## 2022-07-08 NOTE — Telephone Encounter (Signed)
Dr. Melina Schools message sent to pt in mychart.

## 2022-07-09 ENCOUNTER — Other Ambulatory Visit: Payer: Self-pay | Admitting: Internal Medicine

## 2022-07-09 MED FILL — Butalbital-Acetaminophen-Caffeine Tab 50-325-40 MG: ORAL | 8 days supply | Qty: 60 | Fill #1 | Status: AC

## 2022-07-09 MED FILL — Clobetasol Propionate Soln 0.05%: CUTANEOUS | 30 days supply | Qty: 50 | Fill #1 | Status: AC

## 2022-07-10 ENCOUNTER — Other Ambulatory Visit: Payer: Self-pay

## 2022-07-11 ENCOUNTER — Other Ambulatory Visit: Payer: Self-pay

## 2022-07-11 ENCOUNTER — Other Ambulatory Visit: Payer: Self-pay | Admitting: Internal Medicine

## 2022-07-11 MED FILL — Ondansetron Orally Disintegrating Tab 4 MG: ORAL | 7 days supply | Qty: 20 | Fill #0 | Status: AC

## 2022-07-26 ENCOUNTER — Other Ambulatory Visit: Payer: Self-pay

## 2022-08-03 ENCOUNTER — Encounter (INDEPENDENT_AMBULATORY_CARE_PROVIDER_SITE_OTHER): Payer: Self-pay

## 2022-09-01 ENCOUNTER — Ambulatory Visit: Payer: Commercial Managed Care - PPO | Admitting: Internal Medicine

## 2022-09-01 ENCOUNTER — Other Ambulatory Visit: Payer: Self-pay

## 2022-09-01 ENCOUNTER — Encounter: Payer: Self-pay | Admitting: Internal Medicine

## 2022-09-01 ENCOUNTER — Ambulatory Visit (INDEPENDENT_AMBULATORY_CARE_PROVIDER_SITE_OTHER): Payer: Commercial Managed Care - PPO | Admitting: Internal Medicine

## 2022-09-01 ENCOUNTER — Other Ambulatory Visit (HOSPITAL_COMMUNITY): Admission: RE | Admit: 2022-09-01 | Payer: Commercial Managed Care - PPO | Source: Ambulatory Visit

## 2022-09-01 VITALS — BP 112/70 | HR 96 | Temp 98.1°F | Ht 63.0 in | Wt 165.0 lb

## 2022-09-01 DIAGNOSIS — F5102 Adjustment insomnia: Secondary | ICD-10-CM | POA: Diagnosis not present

## 2022-09-01 DIAGNOSIS — Z124 Encounter for screening for malignant neoplasm of cervix: Secondary | ICD-10-CM | POA: Diagnosis not present

## 2022-09-01 DIAGNOSIS — R5383 Other fatigue: Secondary | ICD-10-CM

## 2022-09-01 DIAGNOSIS — B0229 Other postherpetic nervous system involvement: Secondary | ICD-10-CM

## 2022-09-01 DIAGNOSIS — Z0000 Encounter for general adult medical examination without abnormal findings: Secondary | ICD-10-CM

## 2022-09-01 DIAGNOSIS — Z131 Encounter for screening for diabetes mellitus: Secondary | ICD-10-CM | POA: Diagnosis not present

## 2022-09-01 DIAGNOSIS — R7401 Elevation of levels of liver transaminase levels: Secondary | ICD-10-CM | POA: Diagnosis not present

## 2022-09-01 DIAGNOSIS — Z13220 Encounter for screening for lipoid disorders: Secondary | ICD-10-CM

## 2022-09-01 DIAGNOSIS — R519 Headache, unspecified: Secondary | ICD-10-CM

## 2022-09-01 LAB — CBC WITH DIFFERENTIAL/PLATELET
Basophils Absolute: 0.1 10*3/uL (ref 0.0–0.1)
Basophils Relative: 1 % (ref 0.0–3.0)
Eosinophils Absolute: 0.1 10*3/uL (ref 0.0–0.7)
Eosinophils Relative: 1.9 % (ref 0.0–5.0)
HCT: 40.2 % (ref 36.0–46.0)
Hemoglobin: 13 g/dL (ref 12.0–15.0)
Lymphocytes Relative: 23.5 % (ref 12.0–46.0)
Lymphs Abs: 1.7 10*3/uL (ref 0.7–4.0)
MCHC: 32.4 g/dL (ref 30.0–36.0)
MCV: 89.2 fl (ref 78.0–100.0)
Monocytes Absolute: 0.4 10*3/uL (ref 0.1–1.0)
Monocytes Relative: 4.8 % (ref 3.0–12.0)
Neutro Abs: 5 10*3/uL (ref 1.4–7.7)
Neutrophils Relative %: 68.8 % (ref 43.0–77.0)
Platelets: 339 10*3/uL (ref 150.0–400.0)
RBC: 4.5 Mil/uL (ref 3.87–5.11)
RDW: 13.2 % (ref 11.5–15.5)
WBC: 7.3 10*3/uL (ref 4.0–10.5)

## 2022-09-01 LAB — COMPREHENSIVE METABOLIC PANEL
ALT: 29 U/L (ref 0–35)
AST: 14 U/L (ref 0–37)
Albumin: 4.2 g/dL (ref 3.5–5.2)
Alkaline Phosphatase: 92 U/L (ref 39–117)
BUN: 8 mg/dL (ref 6–23)
CO2: 25 meq/L (ref 19–32)
Calcium: 9.3 mg/dL (ref 8.4–10.5)
Chloride: 104 meq/L (ref 96–112)
Creatinine, Ser: 0.68 mg/dL (ref 0.40–1.20)
GFR: 113.27 mL/min (ref >60.00)
Glucose, Bld: 91 mg/dL (ref 70–99)
Potassium: 4 meq/L (ref 3.5–5.1)
Sodium: 138 meq/L (ref 135–145)
Total Bilirubin: 0.3 mg/dL (ref 0.2–1.2)
Total Protein: 6.6 g/dL (ref 6.0–8.3)

## 2022-09-01 LAB — LIPID PANEL
Cholesterol: 117 mg/dL (ref 0–200)
HDL: 46.1 mg/dL (ref >39.00)
LDL Cholesterol: 40 mg/dL (ref 0–99)
NonHDL: 71.26
Total CHOL/HDL Ratio: 3
Triglycerides: 157 mg/dL — ABNORMAL HIGH (ref 0.0–149.0)
VLDL: 31.4 mg/dL (ref 0.0–40.0)

## 2022-09-01 LAB — HEMOGLOBIN A1C: Hgb A1c MFr Bld: 5.3 % (ref 4.6–6.5)

## 2022-09-01 LAB — LDL CHOLESTEROL, DIRECT: Direct LDL: 38 mg/dL

## 2022-09-01 MED ORDER — DILTIAZEM HCL ER COATED BEADS 240 MG PO CP24
240.0000 mg | ORAL_CAPSULE | Freq: Every day | ORAL | 3 refills | Status: DC
  Filled 2022-09-01: qty 30, 30d supply, fill #0
  Filled 2022-10-18: qty 30, 30d supply, fill #1
  Filled 2022-12-13: qty 30, 30d supply, fill #2
  Filled 2023-01-31: qty 30, 30d supply, fill #3

## 2022-09-01 MED ORDER — BUTALBITAL-APAP-CAFFEINE 50-325-40 MG PO TABS
1.0000 | ORAL_TABLET | Freq: Four times a day (QID) | ORAL | 2 refills | Status: DC | PRN
  Filled 2022-09-01 – 2022-10-31 (×2): qty 60, 8d supply, fill #0
  Filled 2022-12-13: qty 60, 8d supply, fill #1

## 2022-09-01 MED ORDER — ALPRAZOLAM 0.25 MG PO TABS
0.2500 mg | ORAL_TABLET | Freq: Every evening | ORAL | 5 refills | Status: DC | PRN
  Filled 2022-09-01: qty 30, 30d supply, fill #0
  Filled 2022-10-18: qty 30, 30d supply, fill #1
  Filled 2022-12-13: qty 30, 30d supply, fill #2
  Filled 2023-01-31: qty 30, 30d supply, fill #3

## 2022-09-01 MED ORDER — SERTRALINE HCL 50 MG PO TABS
50.0000 mg | ORAL_TABLET | Freq: Every day | ORAL | 1 refills | Status: DC
  Filled 2022-09-01 – 2022-10-18 (×2): qty 90, 90d supply, fill #0
  Filled 2023-01-31: qty 90, 90d supply, fill #1

## 2022-09-01 MED ORDER — SUMATRIPTAN SUCCINATE 25 MG PO TABS
ORAL_TABLET | ORAL | 0 refills | Status: DC
  Filled 2022-09-01: qty 9, 30d supply, fill #0

## 2022-09-01 NOTE — Assessment & Plan Note (Signed)
Refilling fioricet for prn use and refilling  imitrex for migraine

## 2022-09-01 NOTE — Assessment & Plan Note (Signed)
Continue  sertraline for management of anxiety related to son''s health issus.  Prn alprazolam refilled, The risks and benefits of benzodiazepine use were reviewed with patient today including excessive sedation leading to respiratory depression,  impaired thinking/driving, and addiction.  Patient was advised to avoid concurrent use with alcohol, to use medication only as needed and not to share with others  .

## 2022-09-01 NOTE — Assessment & Plan Note (Signed)
All screening labs were normal and LFTs have normalized;  likely viral   Lab Results  Component Value Date   ALT 24 09/03/2021   AST 14 09/03/2021   ALKPHOS 83 09/03/2021   BILITOT 0.5 09/03/2021

## 2022-09-01 NOTE — Progress Notes (Signed)
Patient ID: Lori Douglas, female    DOB: 1985/12/28  Age: 37 y.o. MRN: [id number]  The patient is here for annual preventive examination and management of other chronic and acute problems.   The risk factors are reflected in the social history.   The roster of all physicians providing medical care to patient - is listed in the Snapshot section of the chart.   Activities of daily living:  The patient is 100% independent in all ADLs: dressing, toileting, feeding as well as independent mobility   Home safety : The patient has smoke detectors in the home. They wear seatbelts.  There are no unsecured firearms at home. There is no violence in the home.    There is no risks for hepatitis, STDs or HIV. There is no   history of blood transfusion. They have no travel history to infectious disease endemic areas of the world.   The patient has seen their dentist in the last six month. They have seen their eye doctor in the last year. The patinet  denies slight hearing difficulty with regard to whispered voices and some television programs.  They have deferred audiologic testing in the last year.  They do not  have excessive sun exposure. Discussed the need for sun protection: hats, long sleeves and use of sunscreen if there is significant sun exposure.    Diet: the importance of a healthy diet is discussed. They do have a healthy diet.   The benefits of regular aerobic exercise were discussed. The patient  exercises  3 to 5 days per week  for  60 minutes.    Depression screen: there are no signs or vegative symptoms of depression- irritability, change in appetite, anhedonia, sadness/tearfullness.   The following portions of the patient's history were reviewed and updated as appropriate: allergies, current medications, past family history, past medical history,  past surgical history, past social history  and problem list.   Visual acuity was not assessed per patient preference since the patient has  regular follow up with an  ophthalmologist. Hearing and body mass index were assessed and reviewed.    During the course of the visit the patient was educated and counseled about appropriate screening and preventive services including : fall prevention , diabetes screening, nutrition counseling, colorectal cancer screening, and recommended immunizations.    Chief Complaint:   1) Her post herpetic neuralgia has been  improving , she has been using alpha lipoic acid , no lidocaine,  and less gabapentin  2) Insomnia: intermittent,  aggravated by working night shifts at [employer] .  Trouble staying asleep,  brain feels too stimulated.  Using alprazolam prn.  Last refill April 2024 for #30 per [place] [facility] 3) Migraine headaches : managed with Imitrex/fioricet . Not occurring more than one a month  4) Contraceptive method:  condoms.   5) Grief:  lost a beloved family member ("2nd father to me") in 22-Jun-2022 to sudden death .  No relation, was her father's best fried.   Review of Symptoms  Patient denies headache, fevers, malaise, unintentional weight loss, skin rash, eye pain, sinus congestion and sinus pain, sore throat, dysphagia,  hemoptysis , cough, dyspnea, wheezing, chest pain, palpitations, orthopnea, edema, abdominal pain, nausea, melena, diarrhea, constipation, flank pain, dysuria, hematuria, urinary  Frequency, nocturia, numbness, tingling, seizures,  Focal weakness, Loss of consciousness,  Tremor, insomnia, depression, anxiety, and suicidal ideation.    Physical Exam:  BP 112/70   Pulse 96   Temp 98.1 F (36.7  C) (Oral)   Ht 5\' 3"  (1.6 m)   Wt 165 lb (74.8 kg)   SpO2 95%   BMI 29.23 kg/m    Physical Exam Vitals reviewed.  Constitutional:      General: She is not in acute distress.    Appearance: Normal appearance. She is well-developed and normal weight. She is not ill-appearing, toxic-appearing or diaphoretic.  HENT:     Head: Normocephalic.     Right Ear: Tympanic membrane, ear canal  and external ear normal. There is no impacted cerumen.     Left Ear: Tympanic membrane, ear canal and external ear normal. There is no impacted cerumen.     Nose: Nose normal.     Mouth/Throat:     Mouth: Mucous membranes are moist.     Pharynx: Oropharynx is clear.  Eyes:     General: No scleral icterus.       Right eye: No discharge.        Left eye: No discharge.     Conjunctiva/sclera: Conjunctivae normal.     Pupils: Pupils are equal, round, and reactive to light.  Neck:     Thyroid: No thyromegaly.     Vascular: No carotid bruit or JVD.  Cardiovascular:     Rate and Rhythm: Normal rate and regular rhythm.     Heart sounds: Normal heart sounds.  Pulmonary:     Effort: Pulmonary effort is normal. No respiratory distress.     Breath sounds: Normal breath sounds.  Chest:  Breasts:    Breasts are symmetrical.     Right: Normal. No swelling, inverted nipple, mass, nipple discharge, skin change or tenderness.     Left: Normal. No swelling, inverted nipple, mass, nipple discharge, skin change or tenderness.  Abdominal:     General: Bowel sounds are normal.     Palpations: Abdomen is soft. There is no mass.     Tenderness: There is no abdominal tenderness. There is no guarding or rebound.     Hernia: There is no hernia in the left inguinal area or right inguinal area.  Genitourinary:    Exam position: Lithotomy position.     Pubic Area: No rash or pubic lice.      Labia:        Right: No rash, tenderness, lesion or injury.        Left: No rash, tenderness, lesion or injury.      Vagina: Normal.     Cervix: Normal.     Uterus: Normal.      Adnexa: Right adnexa normal and left adnexa normal.  Musculoskeletal:        General: Normal range of motion.     Cervical back: Normal range of motion and neck supple.  Lymphadenopathy:     Cervical: No cervical adenopathy.     Upper Body:     Right upper body: No supraclavicular, axillary or pectoral adenopathy.     Left upper body: No  supraclavicular, axillary or pectoral adenopathy.     Lower Body: No right inguinal adenopathy. No left inguinal adenopathy.  Skin:    General: Skin is warm and dry.  Neurological:     General: No focal deficit present.     Mental Status: She is alert and oriented to person, place, and time. Mental status is at baseline.  Psychiatric:        Mood and Affect: Mood normal.        Behavior: Behavior normal.  Thought Content: Thought content normal.        Judgment: Judgment normal.    Assessment and Plan: Cervical cancer screening -     Cytology - PAP  Encounter for preventive health examination Assessment & Plan: age appropriate education and counseling updated, referrals for preventative services and immunizations addressed, dietary and smoking counseling addressed, most recent labs reviewed.  I have personally reviewed and have noted:   1) the patient's medical and social history 2) The pt's use of alcohol, tobacco, and illicit drugs 3) The patient's current medications and supplements 4) Functional ability including ADL's, fall risk, home safety risk, hearing and visual impairment 5) Diet and physical activities 6) Evidence for depression or mood disorder 7) The patient's height, weight, and BMI have been recorded in the chart  I have made referrals, and provided counseling and education based on review of the above   Orders: -     CBC with Differential/Platelet -     TSH -     Comprehensive metabolic panel -     Hemoglobin A1c -     Lipid panel -     LDL cholesterol, direct  Other fatigue -     CBC with Differential/Platelet -     TSH  Post herpetic neuralgia Assessment & Plan: Improving.  Has reduced gabapentin to 300 mg once daily,  and alpha lipoic acid supplement daily      Headache disorder Assessment & Plan: Refilling fioricet for prn use and refilling  imitrex for migraine    Elevated ALT measurement Assessment & Plan: All screening labs were  normal and LFTs have normalized;  likely viral   Lab Results  Component Value Date   ALT 24 09/03/2021   AST 14 09/03/2021   ALKPHOS 83 09/03/2021   BILITOT 0.5 09/03/2021      Insomnia due to psychological stress Assessment & Plan: Continue  sertraline for management of anxiety related to son''s health issus.  Prn alprazolam refilled, The risks and benefits of benzodiazepine use were reviewed with patient today including excessive sedation leading to respiratory depression,  impaired thinking/driving, and addiction.  Patient was advised to avoid concurrent use with alcohol, to use medication only as needed and not to share with others  .     Other orders -     dilTIAZem HCl ER Coated Beads; Take 1 capsule (240 mg total) by mouth daily.  Dispense: 30 capsule; Refill: 3 -     Sertraline HCl; Take 1 tablet (50 mg total) by mouth daily.  Dispense: 90 tablet; Refill: 1 -     SUMAtriptan Succinate; Take 1 tablet (25 mg total) by mouth as needed for migraine. May repeat in 2 hours if headache persists or recurs.  Dispense: 9 tablet; Refill: 0 -     ALPRAZolam; Take 1 tablet (0.25 mg total) by mouth at bedtime as needed for anxiety.  Dispense: 30 tablet; Refill: 5 -     Butalbital-APAP-Caffeine; Take 1-2 tablets by mouth every 6 (six) hours as needed.  Dispense: 60 tablet; Refill: 2    No follow-ups on file.  Sherlene Shams, MD

## 2022-09-01 NOTE — Assessment & Plan Note (Signed)
Improving.  Has reduced gabapentin to 300 mg once daily,  and alpha lipoic acid supplement daily

## 2022-09-01 NOTE — Patient Instructions (Signed)
Td booster is due march 2025  Alprazolam has been refilled and Fioricet put on file for future refills

## 2022-09-01 NOTE — Assessment & Plan Note (Signed)

## 2022-09-02 LAB — TSH: TSH: 1.84 u[IU]/mL (ref 0.35–5.50)

## 2022-09-08 ENCOUNTER — Other Ambulatory Visit: Payer: Self-pay

## 2022-09-08 LAB — CYTOLOGY - PAP
Comment: NEGATIVE
Diagnosis: NEGATIVE
High risk HPV: NEGATIVE

## 2022-09-08 MED FILL — Cyclobenzaprine HCl Tab 5 MG: ORAL | 30 days supply | Qty: 90 | Fill #2 | Status: AC

## 2022-09-13 ENCOUNTER — Encounter: Payer: Self-pay | Admitting: Internal Medicine

## 2022-09-13 ENCOUNTER — Other Ambulatory Visit: Payer: Self-pay

## 2022-09-13 MED ORDER — TRAZODONE HCL 50 MG PO TABS
25.0000 mg | ORAL_TABLET | Freq: Every evening | ORAL | 3 refills | Status: DC | PRN
  Filled 2022-09-13: qty 90, 90d supply, fill #0
  Filled 2023-03-03: qty 90, 90d supply, fill #1
  Filled 2023-07-27: qty 90, 90d supply, fill #2

## 2022-10-18 ENCOUNTER — Other Ambulatory Visit: Payer: Self-pay

## 2022-10-18 MED FILL — Clobetasol Propionate Soln 0.05%: CUTANEOUS | 30 days supply | Qty: 50 | Fill #2 | Status: AC

## 2022-10-31 ENCOUNTER — Other Ambulatory Visit: Payer: Self-pay

## 2022-10-31 ENCOUNTER — Telehealth: Payer: Commercial Managed Care - PPO | Admitting: Physician Assistant

## 2022-10-31 ENCOUNTER — Other Ambulatory Visit: Payer: Self-pay | Admitting: Internal Medicine

## 2022-10-31 DIAGNOSIS — T3695XA Adverse effect of unspecified systemic antibiotic, initial encounter: Secondary | ICD-10-CM | POA: Diagnosis not present

## 2022-10-31 DIAGNOSIS — J019 Acute sinusitis, unspecified: Secondary | ICD-10-CM

## 2022-10-31 DIAGNOSIS — B9689 Other specified bacterial agents as the cause of diseases classified elsewhere: Secondary | ICD-10-CM | POA: Diagnosis not present

## 2022-10-31 DIAGNOSIS — B379 Candidiasis, unspecified: Secondary | ICD-10-CM | POA: Diagnosis not present

## 2022-10-31 MED ORDER — SUMATRIPTAN SUCCINATE 25 MG PO TABS
25.0000 mg | ORAL_TABLET | ORAL | 0 refills | Status: DC
  Filled 2022-10-31: qty 9, 15d supply, fill #0

## 2022-10-31 MED ORDER — AZITHROMYCIN 250 MG PO TABS
ORAL_TABLET | ORAL | 0 refills | Status: AC
Start: 2022-10-31 — End: 2022-11-05
  Filled 2022-10-31: qty 6, 5d supply, fill #0

## 2022-10-31 NOTE — Progress Notes (Signed)
E-Visit for Sinus Problems  We are sorry that you are not feeling well.  Here is how we plan to help!  Based on what you have shared with me it looks like you have sinusitis.  Sinusitis is inflammation and infection in the sinus cavities of the head.  Based on your presentation I believe you most likely have Acute Bacterial Sinusitis.  This is an infection caused by bacteria and is treated with antibiotics. I have prescribed Azithromycin 250mg Take 2 tablets on day 1, then 1 tablet daily until completed.  You may use an oral decongestant such as Mucinex D or if you have glaucoma or high blood pressure use plain Mucinex. Saline nasal spray help and can safely be used as often as needed for congestion.  If you develop worsening sinus pain, fever or notice severe headache and vision changes, or if symptoms are not better after completion of antibiotic, please schedule an appointment with a health care provider.    Sinus infections are not as easily transmitted as other respiratory infection, however we still recommend that you avoid close contact with loved ones, especially the very young and elderly.  Remember to wash your hands thoroughly throughout the day as this is the number one way to prevent the spread of infection!  Home Care: Only take medications as instructed by your medical team. Complete the entire course of an antibiotic. Do not take these medications with alcohol. A steam or ultrasonic humidifier can help congestion.  You can place a towel over your head and breathe in the steam from hot water coming from a faucet. Avoid close contacts especially the very young and the elderly. Cover your mouth when you cough or sneeze. Always remember to wash your hands.  Get Help Right Away If: You develop worsening fever or sinus pain. You develop a severe head ache or visual changes. Your symptoms persist after you have completed your treatment plan.  Make sure you Understand these  instructions. Will watch your condition. Will get help right away if you are not doing well or get worse.  Thank you for choosing an e-visit.  Your e-visit answers were reviewed by a board certified advanced clinical practitioner to complete your personal care plan. Depending upon the condition, your plan could have included both over the counter or prescription medications.  Please review your pharmacy choice. Make sure the pharmacy is open so you can pick up prescription now. If there is a problem, you may contact your provider through MyChart messaging and have the prescription routed to another pharmacy.  Your safety is important to us. If you have drug allergies check your prescription carefully.   For the next 24 hours you can use MyChart to ask questions about today's visit, request a non-urgent call back, or ask for a work or school excuse. You will get an email in the next two days asking about your experience. I hope that your e-visit has been valuable and will speed your recovery.  I have spent 5 minutes in review of e-visit questionnaire, review and updating patient chart, medical decision making and response to patient.   Koraline Phillipson M Aki Abalos, PA-C  

## 2022-11-01 ENCOUNTER — Other Ambulatory Visit: Payer: Self-pay

## 2022-11-01 MED ORDER — FLUCONAZOLE 150 MG PO TABS
150.0000 mg | ORAL_TABLET | ORAL | 0 refills | Status: DC | PRN
Start: 2022-11-01 — End: 2023-03-28
  Filled 2022-11-01: qty 2, 6d supply, fill #0

## 2022-11-01 NOTE — Addendum Note (Signed)
Addended by: Margaretann Loveless on: 11/01/2022 11:45 AM   Modules accepted: Orders

## 2022-11-23 ENCOUNTER — Telehealth: Payer: Commercial Managed Care - PPO | Admitting: Physician Assistant

## 2022-11-23 DIAGNOSIS — H5789 Other specified disorders of eye and adnexa: Secondary | ICD-10-CM

## 2022-11-23 DIAGNOSIS — J029 Acute pharyngitis, unspecified: Secondary | ICD-10-CM

## 2022-11-23 NOTE — Progress Notes (Signed)
   Thank you for the details you included in the comment boxes. Those details are very helpful in determining the best course of treatment for you and help [place] to provide the best care. Because you are having multiple issues to be seen for, we recommend that you schedule a Virtual Urgent Care video visit in order for the provider to better assess what is going on.  The provider will be able to give you a more accurate diagnosis and treatment plan if we can more freely discuss your symptoms and with the addition of a virtual examination.   If you change your visit to a video visit, we will bill your insurance (similar to an office visit) and you will not be charged for this e-Visit. You will be able to stay at home and speak with the first available [facility] advanced practice provider. The link to do a video visit is in the drop down Menu tab of your Welcome screen in MyChart.     I have spent 5 minutes in review of e-visit questionnaire, review and updating patient chart, medical decision making and response to patient.   Margaretann Loveless, PA-C

## 2022-12-13 ENCOUNTER — Other Ambulatory Visit: Payer: Self-pay | Admitting: Internal Medicine

## 2022-12-13 ENCOUNTER — Other Ambulatory Visit: Payer: Self-pay

## 2022-12-13 DIAGNOSIS — J40 Bronchitis, not specified as acute or chronic: Secondary | ICD-10-CM

## 2022-12-14 ENCOUNTER — Other Ambulatory Visit: Payer: Self-pay

## 2022-12-14 MED FILL — Sumatriptan Succinate Tab 25 MG: ORAL | 15 days supply | Qty: 9 | Fill #0 | Status: AC

## 2022-12-14 MED FILL — Albuterol Sulfate Inhal Aero 108 MCG/ACT (90MCG Base Equiv): RESPIRATORY_TRACT | 25 days supply | Qty: 6.7 | Fill #0 | Status: AC

## 2022-12-25 ENCOUNTER — Telehealth: Payer: Commercial Managed Care - PPO | Admitting: Family Medicine

## 2022-12-25 DIAGNOSIS — B9689 Other specified bacterial agents as the cause of diseases classified elsewhere: Secondary | ICD-10-CM

## 2022-12-25 DIAGNOSIS — J019 Acute sinusitis, unspecified: Secondary | ICD-10-CM

## 2022-12-25 DIAGNOSIS — T3695XA Adverse effect of unspecified systemic antibiotic, initial encounter: Secondary | ICD-10-CM

## 2022-12-25 DIAGNOSIS — B379 Candidiasis, unspecified: Secondary | ICD-10-CM

## 2022-12-25 MED ORDER — FLUCONAZOLE 150 MG PO TABS: 150.0000 mg | ORAL_TABLET | Freq: Once | ORAL | 0 refills | Status: AC

## 2022-12-25 MED ORDER — CEFDINIR 300 MG PO CAPS: 300.0000 mg | ORAL_CAPSULE | Freq: Two times a day (BID) | ORAL | 0 refills | Status: AC

## 2022-12-25 NOTE — Progress Notes (Signed)
Virtual Visit Consent   Lori Douglas, you are scheduled for a virtual visit with a [facility] provider today. Just as with appointments in the office, your consent must be obtained to participate. Your consent will be active for this visit and any virtual visit you may have with one of our providers in the next 365 days. If you have a MyChart account, a copy of this consent can be sent to you electronically.  As this is a virtual visit, video technology does not allow for your provider to perform a traditional examination. This may limit your provider's ability to fully assess your condition. If your provider identifies any concerns that need to be evaluated in person or the need to arrange testing (such as labs, EKG, etc.), we will make arrangements to do so. Although advances in technology are sophisticated, we cannot ensure that it will always work on either your end or our end. If the connection with a video visit is poor, the visit may have to be switched to a telephone visit. With either a video or telephone visit, we are not always able to ensure that we have a secure connection.  By engaging in this virtual visit, you consent to the provision of healthcare and authorize for your insurance to be billed (if applicable) for the services provided during this visit. Depending on your insurance coverage, you may receive a charge related to this service.  I need to obtain your verbal consent now. Are you willing to proceed with your visit today? [name] has provided verbal consent on 12/25/2022 for a virtual visit (video or telephone). Lori Curio, FNP  Date: 12/25/2022 9:21 AM  Virtual Visit via Video Note   I, Lori Douglas, connected with  Lori Douglas  ([id number], March 21, 1985) on 12/25/22 at  9:15 AM EST by a video-enabled telemedicine application and verified that I am speaking with the correct person using two identifiers.  Location: Patient: Virtual Visit Location Patient:  Home Provider: Virtual Visit Location Provider: Home Office   I discussed the limitations of evaluation and management by telemedicine and the availability of in person appointments. The patient expressed understanding and agreed to proceed.    History of Present Illness: Lori Douglas is a 37 y.o. who identifies as a female who was assigned female at birth, and is being seen today for sinus pressure and pain for a month worsening this week over maxillary and frontal sinuses. No fever. Minimal cough. Post nasal drainage green. 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   Hormone deficiency 03/12/2013    Allergies:  Allergies  Allergen Reactions   Biaxin [Clarithromycin] Other (See Comments)    Other reaction(s): Hallucination Hallucinations  But can take zithromax   Doxycycline Nausea And Vomiting   Sulfa Antibiotics Other (See Comments)   Penicillins Rash   Medications:  Current Outpatient Medications:    cefdinir (OMNICEF) 300 MG capsule, Take 1 capsule (300 mg total) by mouth 2 (two) times daily for 10 days., Disp: 20 capsule, Rfl: 0   fluconazole (DIFLUCAN) 150 MG tablet, Take 1 tablet (150 mg total) by mouth  once for 1 dose., Disp: 1 tablet, Rfl: 0   albuterol (VENTOLIN HFA) 108 (90 Base) MCG/ACT inhaler, Inhale 2 puffs into the lungs every 6 (six) hours as needed for wheezing or shortness of breath., Disp: 6.7 g, Rfl: 0   ALPRAZolam (XANAX) 0.25 MG tablet, Take 1 tablet (0.25 mg total) by mouth at bedtime as needed for anxiety., Disp: 30 tablet, Rfl: 5   amitriptyline (ELAVIL) 100 MG tablet, Take  1 tablet (100 mg total) by mouth at bedtime., Disp: 90 tablet, Rfl: 1   butalbital-acetaminophen-caffeine (FIORICET) 50-325-40 MG tablet, Take 1-2 tablets by mouth every 6 (six) hours as needed., Disp: 60 tablet, Rfl: 2   clobetasol (TEMOVATE) 0.05 % external solution, Apply topically 2 (two) times daily., Disp: 50 mL, Rfl: 3   cyclobenzaprine (FLEXERIL) 5 MG tablet, Take 1 tablet (5 mg total) by mouth 3 (three) times daily as needed for muscle spasms., Disp: 90 tablet, Rfl: 2   diltiazem (CARDIZEM CD) 240 MG 24 hr capsule, Take 1 capsule (240 mg total) by mouth daily., Disp: 30 capsule, Rfl: 3   fluconazole (DIFLUCAN) 150 MG tablet, Take 1 tablet (150 mg total) by mouth every 3 (three) days as needed., Disp: 2 tablet, Rfl: 0   gabapentin (NEURONTIN) 100 MG capsule, Take 2 capsules (200 mg total) by mouth 3 (three) times daily. As needed for post herpetic neuralgia, Disp: 180 capsule, Rfl: 3   ondansetron (ZOFRAN-ODT) 4 MG disintegrating tablet, TAKE 1 TABLET (4 MG TOTAL) BY MOUTH EVERY 8 (EIGHT) HOURS AS NEEDED FOR NAUSEA OR VOMITING., Disp: 20 tablet, Rfl: 0   promethazine (PHENERGAN) 25 MG suppository, Place 1 suppository (25 mg total) rectally every 6 (six) hours as needed for nausea or vomiting., Disp: 12 each, Rfl: 0   sertraline (ZOLOFT) 50 MG tablet, Take 1 tablet (50 mg total) by mouth daily., Disp: 90 tablet, Rfl: 1   SUMAtriptan (IMITREX) 25 MG tablet, Take 1 tablet (25 mg total) by mouth daily as needed for migraine, Disp: 9 tablet, Rfl: 0   traZODone (DESYREL) 50 MG tablet, Take 0.5-1 tablets (25-50 mg total) by mouth at bedtime as needed for sleep., Disp: 90 tablet, Rfl: 3  Observations/Objective: Patient is well-developed, well-nourished in no acute distress.  Resting comfortably  at home.  Head is normocephalic, atraumatic.  No labored breathing.  Speech is clear and coherent with logical content.  Patient is alert and oriented at baseline.    Assessment and Plan: 1. Acute  bacterial sinusitis (Primary)  2. Antibiotic-induced yeast infection  Increase fluids, humidifier at night, continue sudafed, may use flonase. UC if sx persist or worsen.   Follow Up Instructions: I discussed the assessment and treatment plan with the patient. The patient was provided an opportunity to ask questions and all were answered. The patient agreed with the plan and demonstrated an understanding of the instructions.  A copy of instructions were sent to the patient via MyChart unless otherwise noted below.     The patient was advised to call back or seek an in-person evaluation if the symptoms worsen or if the condition fails to improve as anticipated.    Lori Curio, FNP

## 2022-12-25 NOTE — Patient Instructions (Signed)

## 2023-01-31 ENCOUNTER — Other Ambulatory Visit: Payer: Self-pay | Admitting: Internal Medicine

## 2023-01-31 MED FILL — Clobetasol Propionate Soln 0.05%: CUTANEOUS | 30 days supply | Qty: 50 | Fill #3 | Status: AC

## 2023-02-01 ENCOUNTER — Other Ambulatory Visit: Payer: Self-pay

## 2023-02-01 MED ORDER — CYCLOBENZAPRINE HCL 5 MG PO TABS
5.0000 mg | ORAL_TABLET | Freq: Three times a day (TID) | ORAL | 2 refills | Status: DC | PRN
  Filled 2023-02-01 (×2): qty 90, 30d supply, fill #0
  Filled 2023-04-20: qty 90, 30d supply, fill #1
  Filled 2023-06-08: qty 90, 30d supply, fill #2

## 2023-02-01 MED ORDER — PROMETHAZINE HCL 25 MG RE SUPP
25.0000 mg | Freq: Four times a day (QID) | RECTAL | 0 refills | Status: DC | PRN
  Filled 2023-02-01: qty 12, 3d supply, fill #0

## 2023-02-01 MED ORDER — GABAPENTIN 100 MG PO CAPS
200.0000 mg | ORAL_CAPSULE | Freq: Three times a day (TID) | ORAL | 3 refills | Status: DC
  Filled 2023-02-01: qty 180, 30d supply, fill #0
  Filled 2023-04-20: qty 180, 30d supply, fill #1
  Filled 2023-06-08: qty 180, 30d supply, fill #2
  Filled 2023-07-27: qty 180, 30d supply, fill #3

## 2023-03-03 ENCOUNTER — Other Ambulatory Visit: Payer: Self-pay

## 2023-03-03 ENCOUNTER — Encounter: Payer: Self-pay | Admitting: Internal Medicine

## 2023-03-03 ENCOUNTER — Ambulatory Visit: Payer: Commercial Managed Care - PPO | Admitting: Internal Medicine

## 2023-03-03 VITALS — BP 120/76 | HR 114 | Temp 98.7°F | Ht 63.0 in | Wt 166.0 lb

## 2023-03-03 DIAGNOSIS — F5102 Adjustment insomnia: Secondary | ICD-10-CM | POA: Diagnosis not present

## 2023-03-03 DIAGNOSIS — N920 Excessive and frequent menstruation with regular cycle: Secondary | ICD-10-CM | POA: Diagnosis not present

## 2023-03-03 DIAGNOSIS — Z79899 Other long term (current) drug therapy: Secondary | ICD-10-CM | POA: Diagnosis not present

## 2023-03-03 DIAGNOSIS — R000 Tachycardia, unspecified: Secondary | ICD-10-CM

## 2023-03-03 DIAGNOSIS — R7401 Elevation of levels of liver transaminase levels: Secondary | ICD-10-CM

## 2023-03-03 LAB — CBC WITH DIFFERENTIAL/PLATELET
Basophils Absolute: 0.1 10*3/uL (ref 0.0–0.1)
Basophils Relative: 1 % (ref 0.0–3.0)
Eosinophils Absolute: 0.1 10*3/uL (ref 0.0–0.7)
Eosinophils Relative: 2.1 % (ref 0.0–5.0)
HCT: 41.5 % (ref 36.0–46.0)
Hemoglobin: 13.5 g/dL (ref 12.0–15.0)
Lymphocytes Relative: 34.8 % (ref 12.0–46.0)
Lymphs Abs: 1.9 10*3/uL (ref 0.7–4.0)
MCHC: 32.7 g/dL (ref 30.0–36.0)
MCV: 89.6 fL (ref 78.0–100.0)
Monocytes Absolute: 0.3 10*3/uL (ref 0.1–1.0)
Monocytes Relative: 6.2 % (ref 3.0–12.0)
Neutro Abs: 3 10*3/uL (ref 1.4–7.7)
Neutrophils Relative %: 55.9 % (ref 43.0–77.0)
Platelets: 340 10*3/uL (ref 150.0–400.0)
RBC: 4.63 Mil/uL (ref 3.87–5.11)
RDW: 13.7 % (ref 11.5–15.5)
WBC: 5.4 10*3/uL (ref 4.0–10.5)

## 2023-03-03 LAB — IBC + FERRITIN
Ferritin: 21.2 ng/mL (ref 10.0–291.0)
Iron: 59 ug/dL (ref 42–145)
Saturation Ratios: 16.1 % — ABNORMAL LOW (ref 20.0–50.0)
TIBC: 366.8 ug/dL (ref 250.0–450.0)
Transferrin: 262 mg/dL (ref 212.0–360.0)

## 2023-03-03 LAB — COMPREHENSIVE METABOLIC PANEL
ALT: 16 U/L (ref 0–35)
AST: 16 U/L (ref 0–37)
Albumin: 4.4 g/dL (ref 3.5–5.2)
Alkaline Phosphatase: 76 U/L (ref 39–117)
BUN: 9 mg/dL (ref 6–23)
CO2: 29 meq/L (ref 19–32)
Calcium: 9.2 mg/dL (ref 8.4–10.5)
Chloride: 102 meq/L (ref 96–112)
Creatinine, Ser: 0.81 mg/dL (ref 0.40–1.20)
GFR: 94.08 mL/min (ref >60.00)
Glucose, Bld: 78 mg/dL (ref 70–99)
Potassium: 3.7 meq/L (ref 3.5–5.1)
Sodium: 139 meq/L (ref 135–145)
Total Bilirubin: 0.4 mg/dL (ref 0.2–1.2)
Total Protein: 7.1 g/dL (ref 6.0–8.3)

## 2023-03-03 LAB — TSH: TSH: 1.45 u[IU]/mL (ref 0.35–5.50)

## 2023-03-03 MED ORDER — SERTRALINE HCL 50 MG PO TABS
50.0000 mg | ORAL_TABLET | Freq: Every day | ORAL | 1 refills | Status: AC
  Filled 2023-03-03 – 2023-06-08 (×2): qty 90, 90d supply, fill #0
  Filled 2023-10-24: qty 90, 90d supply, fill #1

## 2023-03-03 MED ORDER — AMITRIPTYLINE HCL 100 MG PO TABS
100.0000 mg | ORAL_TABLET | Freq: Every day | ORAL | 1 refills | Status: DC
  Filled 2023-03-03: qty 90, 90d supply, fill #0
  Filled 2023-07-27: qty 90, 90d supply, fill #1

## 2023-03-03 MED ORDER — DILTIAZEM HCL ER COATED BEADS 240 MG PO CP24
240.0000 mg | ORAL_CAPSULE | Freq: Every day | ORAL | 1 refills | Status: DC
Start: 2023-03-03 — End: 2023-09-20
  Filled 2023-03-03: qty 90, 90d supply, fill #0
  Filled 2023-06-08: qty 90, 90d supply, fill #1

## 2023-03-03 NOTE — Patient Instructions (Signed)
 Good to see you!    Referral to [facility] cardiology for the tachycardia  Labs will result by end of day (usually);  iron,  cbc thyroid lfts  Continue current meds for now

## 2023-03-03 NOTE — Assessment & Plan Note (Signed)
 All screening labs were normal and LFTs have normalized;  likely viral . Repeating gtoday   Lab Results  Component Value Date   ALT 29 09/01/2022   AST 14 09/01/2022   ALKPHOS 92 09/01/2022   BILITOT 0.3 09/01/2022

## 2023-03-03 NOTE — Progress Notes (Signed)
 Subjective:  Patient ID: Lori Douglas, female    DOB: 09/15/1985  Age: 38 y.o. MRN: [id number]  CC: The primary encounter diagnosis was Long-term use of high-risk medication. Diagnoses of Menorrhagia with regular cycle, Chronic tachycardia, Elevated ALT measurement, and Insomnia due to psychological stress were also pertinent to this visit.   HPI Lori Douglas presents for  Chief Complaint  Patient presents with   Medical Management of Chronic Issues   She feels generally well.  Son's health is fine, but a recent death I n the family is cited as a stressor  using trazodone for insomnia,  and rare use of alprazolam for early waking that does not respond to prayer.  Headaches:  using elavil for prevention and imitrex/fioricet prn   Tachycardia:  chronic since 2021.  Tolerating low dose cardizem without edema.  Not short of breath with brisk walks,  but short of breat after 2 flights of stairs.  No prior cardiology evaluation    Outpatient Medications Prior to Visit  Medication Sig Dispense Refill   albuterol (VENTOLIN HFA) 108 (90 Base) MCG/ACT inhaler Inhale 2 puffs into the lungs every 6 (six) hours as needed for wheezing or shortness of breath. 6.7 g 0   ALPRAZolam (XANAX) 0.25 MG tablet Take 1 tablet (0.25 mg total) by mouth at bedtime as needed for anxiety. 30 tablet 5   butalbital-acetaminophen-caffeine (FIORICET) 50-325-40 MG tablet Take 1-2 tablets by mouth every 6 (six) hours as needed. 60 tablet 2   clobetasol (TEMOVATE) 0.05 % external solution Apply topically 2 (two) times daily. 50 mL 3   cyclobenzaprine (FLEXERIL) 5 MG tablet Take 1 tablet (5 mg total) by mouth 3 (three) times daily as needed for muscle spasms. 90 tablet 2   fluconazole (DIFLUCAN) 150 MG tablet Take 1 tablet (150 mg total) by mouth every 3 (three) days as needed. 2 tablet 0   gabapentin (NEURONTIN) 100 MG capsule Take 2 capsules (200 mg total) by mouth 3 (three) times daily as needed for post herpetic  neuralgia 180 capsule 3   ondansetron (ZOFRAN-ODT) 4 MG disintegrating tablet TAKE 1 TABLET (4 MG TOTAL) BY MOUTH EVERY 8 (EIGHT) HOURS AS NEEDED FOR NAUSEA OR VOMITING. 20 tablet 0   promethazine (PROMETHEGAN) 25 MG suppository Place 1 suppository (25 mg total) rectally every 6 (six) hours as needed for nausea or vomiting. 12 each 0   SUMAtriptan (IMITREX) 25 MG tablet Take 1 tablet (25 mg total) by mouth daily as needed for migraine 9 tablet 0   traZODone (DESYREL) 50 MG tablet Take 0.5-1 tablets (25-50 mg total) by mouth at bedtime as needed for sleep. 90 tablet 3   diltiazem (CARDIZEM CD) 240 MG 24 hr capsule Take 1 capsule (240 mg total) by mouth daily. 30 capsule 3   amitriptyline (ELAVIL) 100 MG tablet Take 1 tablet (100 mg total) by mouth at bedtime. 90 tablet 1   sertraline (ZOLOFT) 50 MG tablet Take 1 tablet (50 mg total) by mouth daily. 90 tablet 1   No facility-administered medications prior to visit.    Review of Systems;  Patient denies headache, fevers, malaise, unintentional weight loss, skin rash, eye pain, sinus congestion and sinus pain, sore throat, dysphagia,  hemoptysis , cough, dyspnea, wheezing, chest pain, palpitations, orthopnea, edema, abdominal pain, nausea, melena, diarrhea, constipation, flank pain, dysuria, hematuria, urinary  Frequency, nocturia, numbness, tingling, seizures,  Focal weakness, Loss of consciousness,  Tremor, depression,  and suicidal ideation.      Objective:  BP 120/76   Pulse (!) 114   Temp 98.7 F (37.1 C) (Oral)   Ht 5\' 3"  (1.6 m)   Wt 166 lb (75.3 kg)   SpO2 99%   BMI 29.41 kg/m   BP Readings from Last 3 Encounters:  03/03/23 120/76  09/01/22 112/70  09/03/21 120/74    Wt Readings from Last 3 Encounters:  03/03/23 166 lb (75.3 kg)  09/01/22 165 lb (74.8 kg)  09/03/21 163 lb 9.6 oz (74.2 kg)    Physical Exam Vitals reviewed.  Constitutional:      General: She is not in acute distress.    Appearance: Normal appearance.  She is normal weight. She is not ill-appearing, toxic-appearing or diaphoretic.  HENT:     Head: Normocephalic.  Eyes:     General: No scleral icterus.       Right eye: No discharge.        Left eye: No discharge.     Conjunctiva/sclera: Conjunctivae normal.  Cardiovascular:     Rate and Rhythm: Regular rhythm. Tachycardia present.     Heart sounds: Normal heart sounds.  Pulmonary:     Effort: Pulmonary effort is normal. No respiratory distress.     Breath sounds: Normal breath sounds.  Musculoskeletal:        General: Normal range of motion.  Skin:    General: Skin is warm and dry.  Neurological:     General: No focal deficit present.     Mental Status: She is alert and oriented to person, place, and time. Mental status is at baseline.  Psychiatric:        Mood and Affect: Mood normal.        Behavior: Behavior normal.        Thought Content: Thought content normal.        Judgment: Judgment normal.   Lab Results  Component Value Date   HGBA1C 5.3 09/01/2022    Lab Results  Component Value Date   CREATININE 0.68 09/01/2022   CREATININE 0.85 09/03/2021   CREATININE 0.77 06/02/2020    Lab Results  Component Value Date   WBC 7.3 09/01/2022   HGB 13.0 09/01/2022   HCT 40.2 09/01/2022   PLT 339.0 09/01/2022   GLUCOSE 91 09/01/2022   CHOL 117 09/01/2022   TRIG 157.0 (H) 09/01/2022   HDL 46.10 09/01/2022   LDLDIRECT 38.0 09/01/2022   LDLCALC 40 09/01/2022   ALT 29 09/01/2022   AST 14 09/01/2022   NA 138 09/01/2022   K 4.0 09/01/2022   CL 104 09/01/2022   CREATININE 0.68 09/01/2022   BUN 8 09/01/2022   CO2 25 09/01/2022   TSH 1.84 09/01/2022   INR 0.85 01/15/2018   HGBA1C 5.3 09/01/2022    No results found.  Assessment & Plan:  .Long-term use of high-risk medication -     Comprehensive metabolic panel  Menorrhagia with regular cycle -     Iron, TIBC and Ferritin Panel -     TSH -     CBC with Differential/Platelet  Chronic tachycardia Assessment &  Plan: Elevated despite cardizem.  Baseline EKG today,  I have ordered and reviewed a 12 lead EKG and find that there are no acute changes and patient is in sinus rhythm.   referral to cardiology in process to evaluate systolic function   Orders: -     Ambulatory referral to Cardiology -     EKG 12-Lead  Elevated ALT measurement Assessment & Plan: All screening labs  were normal and LFTs have normalized;  likely viral . Repeating gtoday   Lab Results  Component Value Date   ALT 29 09/01/2022   AST 14 09/01/2022   ALKPHOS 92 09/01/2022   BILITOT 0.3 09/01/2022      Insomnia due to psychological stress Assessment & Plan: Continue trazodone nightly,  prn alprazolam for early wakeups.  Refill history confirmed via [place] Controlled Substance databas, accessed by me today.. She has not had any ER visits  And has not requested any early refills.  Her Refill history was confirmed via [place] Controlled Substance database by me today during her visit and there have been no prescriptions of controlled substances filled from any providers other than me. Refill history suggests that she uses 30 alprazolam every 6 weeks      Other orders -     Amitriptyline HCl; Take 1 tablet (100 mg total) by mouth at bedtime.  Dispense: 90 tablet; Refill: 1 -     Sertraline HCl; Take 1 tablet (50 mg total) by mouth daily.  Dispense: 90 tablet; Refill: 1 -     dilTIAZem HCl ER Coated Beads; Take 1 capsule (240 mg total) by mouth daily.  Dispense: 90 capsule; Refill: 1     In addition to time spent reading her EKG , I  spent 30 minutes on the day of this face to face encounter reviewing patient's  most recent  surgical and non surgical procedures, previous labs and imaging studies, counseling on weight management,  reviewing the assessment and plan with patient, and post visit ordering and reviewing of  diagnostics and therapeutics with patient  .   Follow-up: Return in about 6 months (around 08/31/2023).   Sherlene Shams, MD

## 2023-03-03 NOTE — Assessment & Plan Note (Addendum)
 Elevated despite cardizem.  Baseline EKG today,  I have ordered and reviewed a 12 lead EKG and find that there are no acute changes and patient is in sinus rhythm and has a slight shift in axis.   Referral to cardiology in process to evaluate systolic function

## 2023-03-03 NOTE — Assessment & Plan Note (Addendum)
 Continue trazodone nightly,  prn alprazolam for early wakeups.  Refill history confirmed via [place] Controlled Substance databas, accessed by me today.. She has not had any ER visits  And has not requested any early refills.  Her Refill history was confirmed via [place] Controlled Substance database by me today during her visit and there have been no prescriptions of controlled substances filled from any providers other than me. Refill history suggests that she uses 30 alprazolam every 6 weeks

## 2023-03-05 ENCOUNTER — Encounter: Payer: Self-pay | Admitting: Internal Medicine

## 2023-03-28 ENCOUNTER — Other Ambulatory Visit: Payer: Self-pay

## 2023-03-28 ENCOUNTER — Telehealth: Admitting: Physician Assistant

## 2023-03-28 DIAGNOSIS — N764 Abscess of vulva: Secondary | ICD-10-CM

## 2023-03-28 MED ORDER — FLUCONAZOLE 150 MG PO TABS
150.0000 mg | ORAL_TABLET | ORAL | 0 refills | Status: DC | PRN
  Filled 2023-03-28: qty 2, 6d supply, fill #0

## 2023-03-28 MED ORDER — CEPHALEXIN 500 MG PO CAPS
500.0000 mg | ORAL_CAPSULE | Freq: Four times a day (QID) | ORAL | 0 refills | Status: DC
  Filled 2023-03-28: qty 20, 5d supply, fill #0

## 2023-03-28 NOTE — Addendum Note (Signed)
 Addended by: Margaretann Loveless on: 03/28/2023 08:26 AM   Modules accepted: Orders

## 2023-03-28 NOTE — Progress Notes (Signed)
 E-Visit for Skin Abscess  We are sorry that you are not feeling well. Here is how we plan to help!  Based on what you shared with me it looks like you have cellulitis.  Cellulitis looks like areas of skin redness, swelling, and warmth; it develops as a result of bacteria entering under the skin. Little red spots and/or bleeding can be seen in skin, and tiny surface sacs containing fluid can occur. Fever can be present.  I have prescribed:  Keflex 500mg  take one by mouth four times a day for 5 days  HOME CARE:  Take your medications as ordered and take all of them, even if the skin irritation appears to be healing.   GET HELP RIGHT AWAY IF:  Symptoms that don't begin to go away within 48 hours. Severe redness persists or worsens If the area turns color, spreads or swells. If it blisters and opens, develops yellow-brown crust or bleeds. You develop a fever or chills. If the pain increases or becomes unbearable.  Are unable to keep fluids and food down.  MAKE SURE YOU   Understand these instructions. Will watch your condition. Will get help right away if you are not doing well or get worse.  Thank you for choosing an e-visit.  Your e-visit answers were reviewed by a board certified advanced clinical practitioner to complete your personal care plan. Depending upon the condition, your plan could have included both over the counter or prescription medications.  Please review your pharmacy choice. Make sure the pharmacy is open so you can pick up prescription now. If there is a problem, you may contact your provider through [employer] and have the prescription routed to another pharmacy.  Your safety is important to [place]. If you have drug allergies check your prescription carefully.   For the next 24 hours you can use MyChart to ask questions about today's visit, request a non-urgent call back, or ask for a work or school excuse. You will get an email in the next two days asking  about your experience. I hope that your e-visit has been valuable and will speed your recovery.   I have spent 5 minutes in review of e-visit questionnaire, review and updating patient chart, medical decision making and response to patient.   Margaretann Loveless, PA-C

## 2023-04-04 ENCOUNTER — Telehealth: Payer: Self-pay | Admitting: Internal Medicine

## 2023-04-04 ENCOUNTER — Encounter: Payer: Self-pay | Admitting: Internal Medicine

## 2023-04-04 NOTE — Telephone Encounter (Signed)
 Lm to reschedule 09/01/2023 appt/provider out.  E2C2 please schedule.

## 2023-04-04 NOTE — Telephone Encounter (Signed)
 Lm to call office to reschedule appointment on 09/01/2023.  E2C2 please reschedule appt

## 2023-04-20 ENCOUNTER — Other Ambulatory Visit: Payer: Self-pay | Admitting: Internal Medicine

## 2023-04-20 ENCOUNTER — Encounter: Payer: Self-pay | Admitting: Internal Medicine

## 2023-04-21 ENCOUNTER — Other Ambulatory Visit: Payer: Self-pay

## 2023-04-23 ENCOUNTER — Other Ambulatory Visit: Payer: Self-pay | Admitting: Internal Medicine

## 2023-04-23 ENCOUNTER — Other Ambulatory Visit: Payer: Self-pay

## 2023-04-24 ENCOUNTER — Other Ambulatory Visit: Payer: Self-pay

## 2023-04-24 MED ORDER — ALPRAZOLAM 0.25 MG PO TABS
0.2500 mg | ORAL_TABLET | Freq: Every evening | ORAL | 5 refills | Status: DC | PRN
  Filled 2023-04-24: qty 30, 30d supply, fill #0
  Filled 2023-06-08: qty 30, 30d supply, fill #1
  Filled 2023-07-27: qty 30, 30d supply, fill #2
  Filled 2023-09-19: qty 30, 30d supply, fill #3

## 2023-04-24 MED FILL — Sumatriptan Succinate Tab 25 MG: ORAL | 15 days supply | Qty: 9 | Fill #0 | Status: AC

## 2023-04-24 NOTE — Telephone Encounter (Signed)
 Last refilled: 09/01/2022 Last OV: 03/03/2023 Next OV: not scheduled

## 2023-05-25 ENCOUNTER — Other Ambulatory Visit: Payer: Self-pay

## 2023-05-25 ENCOUNTER — Other Ambulatory Visit: Payer: Self-pay | Admitting: Internal Medicine

## 2023-05-26 ENCOUNTER — Other Ambulatory Visit: Payer: Self-pay

## 2023-05-30 ENCOUNTER — Other Ambulatory Visit: Payer: Self-pay

## 2023-05-30 MED FILL — Ondansetron Orally Disintegrating Tab 4 MG: ORAL | 7 days supply | Qty: 20 | Fill #0 | Status: AC

## 2023-05-31 ENCOUNTER — Other Ambulatory Visit: Payer: Self-pay

## 2023-05-31 ENCOUNTER — Telehealth: Admitting: Family Medicine

## 2023-05-31 DIAGNOSIS — J069 Acute upper respiratory infection, unspecified: Secondary | ICD-10-CM | POA: Diagnosis not present

## 2023-05-31 MED ORDER — FLUTICASONE PROPIONATE 50 MCG/ACT NA SUSP
2.0000 | Freq: Every day | NASAL | 0 refills | Status: AC
  Filled 2023-05-31: qty 16, 30d supply, fill #0

## 2023-05-31 MED ORDER — BENZONATATE 100 MG PO CAPS
100.0000 mg | ORAL_CAPSULE | Freq: Three times a day (TID) | ORAL | 0 refills | Status: DC | PRN
  Filled 2023-05-31: qty 30, 10d supply, fill #0

## 2023-05-31 NOTE — Progress Notes (Signed)

## 2023-06-09 ENCOUNTER — Other Ambulatory Visit: Payer: Self-pay

## 2023-06-18 ENCOUNTER — Telehealth

## 2023-06-18 ENCOUNTER — Encounter

## 2023-06-18 DIAGNOSIS — B379 Candidiasis, unspecified: Secondary | ICD-10-CM

## 2023-06-18 DIAGNOSIS — T3695XA Adverse effect of unspecified systemic antibiotic, initial encounter: Secondary | ICD-10-CM

## 2023-06-18 DIAGNOSIS — H6691 Otitis media, unspecified, right ear: Secondary | ICD-10-CM | POA: Diagnosis not present

## 2023-06-18 MED ORDER — FLUCONAZOLE 150 MG PO TABS: 150.0000 mg | ORAL_TABLET | Freq: Every day | ORAL | 0 refills | Status: AC

## 2023-06-18 MED ORDER — CEFDINIR 300 MG PO CAPS: 300.0000 mg | ORAL_CAPSULE | Freq: Two times a day (BID) | ORAL | 0 refills | Status: AC

## 2023-06-18 MED ORDER — FLUCONAZOLE 150 MG PO TABS: 150.0000 mg | ORAL_TABLET | ORAL | 0 refills | Status: DC | PRN

## 2023-06-18 NOTE — Progress Notes (Signed)
 E-Visit for Ear Pain - Acute Otitis Media   We are sorry that you are not feeling well. Here is how we plan to help!  Based on what you have shared with me it looks like you have Acute Otitis Media.  Acute Otitis Media is an infection of the middle or inner ear. This type of infection can cause redness, inflammation, and fluid buildup behind the tympanic membrane (ear drum).  The usual symptoms include: Earache/Pain Fever Upper respiratory symptoms Lack of energy/Fatigue/Malaise Slight hearing loss gradually worsening- if the inner ear fills with fluid What causes middle ear infections? Most middle ear infections occur when an infection such as a cold, leads to a build-up of mucus in the middle ear and causes the Eustachian tube (a thin tube that runs from the middle ear to the back of the nose) to become swollen or blocked.   This means mucus can't drain away properly, making it easier for an infection to spread into the middle ear.  How middle ear infections are treated: Most ear infections clear up within three to five days and don't need any specific treatment. If necessary, tylenol  or ibuprofen should be used to relieve pain and a high temperature.  If you develop a fever higher than 102, or any significantly worsening symptoms, this could indicate a more serious infection moving to the middle/inner and needs face to face evaluation in an office by a provider.   Antibiotics aren't routinely used to treat middle ear infections, although they may occasionally be prescribed if symptoms persist or are particularly severe. Given your presentation,   I have sent cefdinir  and diflucan .    Your symptoms should improve over the next 3 days and should resolve in about 7 days. Be sure to complete ALL of the prescription(s) given.  HOME CARE: Wash your hands frequently. If you are prescribed an ear drop, do not place the tip of the bottle on your ear or touch it with your fingers. You can  take Acetaminophen  650 mg every 4-6 hours as needed for pain.  If pain is severe or moderate, you can apply a heating pad (set on low) or hot water bottle (wrapped in a towel) to outer ear for 20 minutes.  This will also increase drainage.  GET HELP RIGHT AWAY IF: Fever is over 102.2 degrees. You develop progressive ear pain or hearing loss. Ear symptoms persist longer than 3 days after treatment.  MAKE SURE YOU: Understand these instructions. Will watch your condition. Will get help right away if you are not doing well or get worse.  Thank you for choosing an e-visit.  Your e-visit answers were reviewed by a board certified advanced clinical practitioner to complete your personal care plan. Depending upon the condition, your plan could have included both over the counter or prescription medications.  Please review your pharmacy choice. Make sure the pharmacy is open so you can pick up the prescription now. If there is a problem, you may contact your provider through [employer] and have the prescription routed to another pharmacy.  Your safety is important to us . If you have drug allergies check your prescription carefully.   For the next 24 hours you can use MyChart to ask questions about today's visit, request a non-urgent call back, or ask for a work or school excuse. You will get an email with a survey after your eVisit asking about your experience. We would appreciate your feedback. I hope that your e-visit has been valuable  and will aid in your recovery.    have provided 5 minutes of non face to face time during this encounter for chart review and documentation.

## 2023-06-21 ENCOUNTER — Ambulatory Visit: Payer: Commercial Managed Care - PPO | Attending: Internal Medicine | Admitting: Internal Medicine

## 2023-06-21 ENCOUNTER — Ambulatory Visit

## 2023-06-21 ENCOUNTER — Encounter: Payer: Self-pay | Admitting: Internal Medicine

## 2023-06-21 VITALS — BP 110/64 | HR 88 | Ht 63.0 in | Wt 161.8 lb

## 2023-06-21 DIAGNOSIS — R011 Cardiac murmur, unspecified: Secondary | ICD-10-CM

## 2023-06-21 DIAGNOSIS — R002 Palpitations: Secondary | ICD-10-CM | POA: Diagnosis not present

## 2023-06-21 DIAGNOSIS — R000 Tachycardia, unspecified: Secondary | ICD-10-CM

## 2023-06-21 NOTE — Patient Instructions (Signed)
 Medication Instructions:  Your physician recommends that you continue on your current medications as directed. Please refer to the Current Medication list given to you today.    *If you need a refill on your cardiac medications before your next appointment, please call your pharmacy*  Lab Work: No labs ordered today    Testing/Procedures: Your physician has requested that you have an echocardiogram. Echocardiography is a painless test that uses sound waves to create images of your heart. It provides your doctor with information about the size and shape of your heart and how well your heart's chambers and valves are working.   You may receive an ultrasound enhancing agent through an IV if needed to better visualize your heart during the echo. This procedure takes approximately one hour.  There are no restrictions for this procedure.  This will take place at [address] [facility]) [address], [place] [address]  Please note: We ask at that you not bring children with you during ultrasound (echo/ vascular) testing. Due to room size and safety concerns, children are not allowed in the ultrasound rooms during exams. Our front office staff cannot provide observation of children in our lobby area while testing is being conducted. An adult accompanying a patient to their appointment will only be allowed in the ultrasound room at the discretion of the ultrasound technician under special circumstances. We apologize for any inconvenience.   Your physician has recommended that you wear a 14 day Zio monitor.   This monitor is a medical device that records the heart's electrical activity. Doctors most often use these monitors to diagnose arrhythmias. Arrhythmias are problems with the speed or rhythm of the heartbeat. The monitor is a small device applied to your chest. You can wear one while you do your normal daily activities. While wearing this monitor if you have any symptoms to push the  button and record what you felt. Once you have worn this monitor for the period of time provider prescribed (Usually 14 days), you will return the monitor device in the postage paid box. Once it is returned they will download the data collected and provide us  with a report which the provider will then review and we will call you with those results. Important tips:  Avoid showering during the first 24 hours of wearing the monitor. Avoid excessive sweating to help maximize wear time. Do not submerge the device, no hot tubs, and no swimming pools. Keep any lotions or oils away from the patch. After 24 hours you may shower with the patch on. Take brief showers with your back facing the shower head.  Do not remove patch once it has been placed because that will interrupt data and decrease adhesive wear time. Push the button when you have any symptoms and write down what you were feeling. Once you have completed wearing your monitor, remove and place into box which has postage paid and place in your outgoing mailbox.  If for some reason you have misplaced your box then call our office and we can provide another box and/or mail it off for you.     Follow-Up: At [facility], you and your health needs are our priority.  As part of our continuing mission to provide you with exceptional heart care, our providers are all part of one team.  This team includes your primary Cardiologist (physician) and Advanced Practice Providers or APPs (Physician Assistants and Nurse Practitioners) who all work together to provide you with the care  you need, when you need it.  Your next appointment:   6 week(s)  Provider:   Sammy Crisp, MD or APP

## 2023-06-21 NOTE — Progress Notes (Signed)
  Cardiology Office Note:  .   Date:  06/21/2023  ID:  Lori Douglas, DOB 10/16/1985, MRN [id number] PCP: Thersia Flax, MD  [facility] Providers Cardiologist:  None { Click to update primary MD,subspecialty MD or APP then REFRESH:1}    History of Present Illness: .   Lori Douglas is a 38 y.o. female with history of migraine headaches, shingles with persistent neuralgia, and preeclampsia, who has been referred by Dr. Madelon Scheuermann for evaluation of chronic tachycardia.  Dilt for several years; HR still elevated.  Was on metoprolol  but no improvement in HR.  Reports 3 innocent heart murmurs.  Occ palpitations, happens several times a week.  Occurs randomly.  Occ SOB.  No CP or LH.  No LE edema.  On diltiazem  for 1-2 years.  Switched from metoprolol  b/c persistent cough that began with COVID-19.  Cefdinir  right now for right ear infection.  Using imitrex  and fioricet  few times a month.  ROS: See HPI  Studies Reviewed: Aaron Aas   EKG Interpretation Date/Time:  Wednesday June 21 2023 15:11:53 EDT Ventricular Rate:  88 PR Interval:  156 QRS Duration:  90 QT Interval:  366 QTC Calculation: 442 R Axis:   3  Text Interpretation: Normal sinus rhythm Low voltage QRS Borderline ECG When compared with ECG of 03-Mar-2023 No significant change was found Confirmed by Labib Cwynar, Veryl Gottron [phone]) on 06/21/2023 3:17:56 PM    *** Risk Assessment/Calculations:   {Does this patient have ATRIAL FIBRILLATION?:[phone]}         Physical Exam:   VS:  BP 110/64 (BP Location: Left Arm, Patient Position: Sitting, Cuff Size: Normal)   Pulse 88   Ht 5' 3 (1.6 m)   Wt 161 lb 12.8 oz (73.4 kg)   SpO2 99%   BMI 28.66 kg/m    Wt Readings from Last 3 Encounters:  06/21/23 161 lb 12.8 oz (73.4 kg)  03/03/23 166 lb (75.3 kg)  09/01/22 165 lb (74.8 kg)    General:  NAD. Neck: No JVD or HJR. Lungs: Clear to auscultation bilaterally without wheezes or crackles. Heart: Regular rate and rhythm without  murmurs, rubs, or gallops. Abdomen: Soft, nontender, nondistended. Extremities: No lower extremity edema.  ASSESSMENT AND PLAN: .    ***    {Are you ordering a CV Procedure (e.g. stress test, cath, DCCV, TEE, etc)?   Press F2        :[id number]}  Dispo: ***  Signed, Sammy Crisp, MD

## 2023-06-22 ENCOUNTER — Encounter: Payer: Self-pay | Admitting: Internal Medicine

## 2023-06-22 DIAGNOSIS — R011 Cardiac murmur, unspecified: Secondary | ICD-10-CM | POA: Insufficient documentation

## 2023-06-22 DIAGNOSIS — R002 Palpitations: Secondary | ICD-10-CM | POA: Insufficient documentation

## 2023-07-12 ENCOUNTER — Ambulatory Visit: Attending: Internal Medicine

## 2023-07-12 DIAGNOSIS — I081 Rheumatic disorders of both mitral and tricuspid valves: Secondary | ICD-10-CM | POA: Diagnosis not present

## 2023-07-12 DIAGNOSIS — R000 Tachycardia, unspecified: Secondary | ICD-10-CM | POA: Diagnosis not present

## 2023-07-12 LAB — ECHOCARDIOGRAM COMPLETE
AR max vel: 2.17 cm2
AV Area VTI: 2.2 cm2
AV Area mean vel: 2.15 cm2
AV Mean grad: 5 mmHg
AV Peak grad: 8.8 mmHg
Ao pk vel: 1.48 m/s
Area-P 1/2: 4.06 cm2
S' Lateral: 3.56 cm

## 2023-07-13 ENCOUNTER — Ambulatory Visit: Payer: Self-pay | Admitting: Internal Medicine

## 2023-07-18 DIAGNOSIS — R011 Cardiac murmur, unspecified: Secondary | ICD-10-CM | POA: Diagnosis not present

## 2023-07-18 DIAGNOSIS — R000 Tachycardia, unspecified: Secondary | ICD-10-CM | POA: Diagnosis not present

## 2023-07-27 ENCOUNTER — Other Ambulatory Visit: Payer: Self-pay | Admitting: Internal Medicine

## 2023-07-28 ENCOUNTER — Other Ambulatory Visit: Payer: Self-pay

## 2023-07-28 ENCOUNTER — Other Ambulatory Visit: Payer: Self-pay | Admitting: Internal Medicine

## 2023-07-28 DIAGNOSIS — R000 Tachycardia, unspecified: Secondary | ICD-10-CM

## 2023-07-28 DIAGNOSIS — R011 Cardiac murmur, unspecified: Secondary | ICD-10-CM

## 2023-07-28 MED FILL — Sumatriptan Succinate Tab 25 MG: ORAL | 15 days supply | Qty: 9 | Fill #0 | Status: AC

## 2023-08-01 NOTE — Progress Notes (Unsigned)
 Cardiology Office Note    Date:  08/02/2023   ID:  [name], [name] 1985-06-26, MRN [id number]  PCP:  Marylynn Verneita CROME, MD  Cardiologist:  Lonni Hanson, MD  Electrophysiologist:  None   Chief Complaint: Follow-up  History of Present Illness:   Lori Douglas is a 37 y.o. female with history of migraine headaches, shingles with persistent neuralgia, and preeclampsia who presents for follow-up of Zio patch and echo.  She was evaluated as a new patient on 06/21/2023 by Dr. Hanson at the request of her PCP for chronic tachycardia.  Patient reported a long history of sinus tachycardia for which she was on metoprolol .  However, this was transitioned to diltiazem  after developing a persistent cough after contracting COVID-19.  She reported sporadic palpitations happening several times a week usually lasting only a few seconds at a time and or sometimes accompanied by shortness of breath.  She was without symptoms of angina.  She also reports being told of a prior murmur noted in childhood and believed she underwent an echo at [facility] as a child with no further workup recommended at that time.  She also reported being on amitriptyline  as prophylactic therapy for migraines.  EKG showed sinus rhythm with a heart rate of 88 bpm on diltiazem .  It was felt amitriptyline  may be contributing to her elevated heart rates.  Zio patch showed a predominant rhythm of sinus with an average rate of 96 bpm disease range 60 to 165 bpm) no sustained arrhythmias or prolonged pauses, rare PACs and PVCs, and patient triggered events corresponded to sinus rhythm, sinus tachycardia, and isolated PVCs.  Echo in 07/2023 showed an EF of 55 to 60%, no regional wall motion abnormalities, normal LV diastolic function parameters, normal RV systolic function and ventricular cavity size, no significant valvular abnormalities, and an estimated right atrial pressure of 3 mmHg.  She comes in doing well from a cardiac perspective today  and is without symptoms of angina or cardiac decompensation.  She does continue to note intermittent tachypalpitations.  No frank syncope.  She is working on decreasing caffeine  intake, previously was drinking mostly Dr. Buster.  Now increasing water intake.  Migraines have been fairly well-controlled on current regimen including amitriptyline  100 mg nightly with as needed abortive therapy.  Open to neurology referral.   Labs independently reviewed: 02/2023 - Hgb 13.5, PLT 340, TSH normal, potassium 3.7, BUN 9, serum creatinine 0.81, albumin 4.4, AST/ALT normal 08/2022 - direct LDL 38, TC 170, TG 157, HDL 46, A1c 5.3  Past Medical History:  Diagnosis Date   Bronchitis due to COVID-19 virus 11/02/2018   Easy bruising 01/12/2018   Frequent headaches    Headache, chronic daily 09/18/2015   Heart murmur    Migraine    Preeclampsia    Psoriasis     Past Surgical History:  Procedure Laterality Date   CESAREAN SECTION  2015   CHOLECYSTECTOMY  2015    Current Medications: Current Meds  Medication Sig   albuterol  (VENTOLIN  HFA) 108 (90 Base) MCG/ACT inhaler Inhale 2 puffs into the lungs every 6 (six) hours as needed for wheezing or shortness of breath.   ALPRAZolam  (XANAX ) 0.25 MG tablet Take 1 tablet (0.25 mg total) by mouth at bedtime as needed for anxiety.   amitriptyline  (ELAVIL ) 100 MG tablet Take 1 tablet (100 mg total) by mouth at bedtime.   butalbital -acetaminophen -caffeine  (FIORICET ) 50-325-40 MG tablet Take 1-2 tablets by mouth every 6 (six) hours as needed.  clobetasol  (TEMOVATE ) 0.05 % external solution Apply topically 2 (two) times daily.   cyclobenzaprine  (FLEXERIL ) 5 MG tablet Take 1 tablet (5 mg total) by mouth 3 (three) times daily as needed for muscle spasms.   diltiazem  (CARDIZEM  CD) 240 MG 24 hr capsule Take 1 capsule (240 mg total) by mouth daily.   gabapentin  (NEURONTIN ) 100 MG capsule Take 2 capsules (200 mg total) by mouth 3 (three) times daily as needed for post  herpetic neuralgia   ondansetron  (ZOFRAN -ODT) 4 MG disintegrating tablet Take 1 tablet (4 mg total) by mouth every 8 (eight) hours as needed for nausea or vomiting.   promethazine  (PROMETHEGAN) 25 MG suppository Place 1 suppository (25 mg total) rectally every 6 (six) hours as needed for nausea or vomiting.   sertraline  (ZOLOFT ) 50 MG tablet Take 1 tablet (50 mg total) by mouth daily.   SUMAtriptan  (IMITREX ) 25 MG tablet Take 1 tablet (25 mg total) by mouth daily as needed for migraine   traZODone  (DESYREL ) 50 MG tablet Take 0.5-1 tablets (25-50 mg total) by mouth at bedtime as needed for sleep.    Allergies:   Biaxin [clarithromycin], Doxycycline , Sulfa antibiotics, and Penicillins   Social History   Socioeconomic History   Marital status: Married    Spouse name: Not on file   Number of children: Not on file   Years of education: Not on file   Highest education level: Not on file  Occupational History   Not on file  Tobacco Use   Smoking status: Never   Smokeless tobacco: Never  Vaping Use   Vaping status: Never Used  Substance and Sexual Activity   Alcohol use: No   Drug use: No   Sexual activity: Yes    Birth control/protection: Condom  Other Topics Concern   Not on file  Social History Narrative   Not on file   Social Drivers of Health   Financial Resource Strain: Not on file  Food Insecurity: Not on file  Transportation Needs: Not on file  Physical Activity: Not on file  Stress: Not on file  Social Connections: Not on file     Family History:  The patient's family history includes Alcohol abuse in her paternal grandfather; Anxiety disorder in her father and mother; Arthritis in her father, maternal grandmother, and mother; Cancer in her maternal grandmother and paternal grandmother; Depression in her father; Diabetes in her paternal aunt; Endometriosis in her sister; Heart disease in her father and maternal grandmother; Hodgkin's lymphoma in her father;  Hyperlipidemia in her father; Hypertension in her father and maternal grandmother; Psoriasis in her brother; Stroke in her maternal grandmother.  ROS:   12-point review of systems is negative unless otherwise noted in the HPI.   EKGs/Labs/Other Studies Reviewed:    Studies reviewed were summarized above. The additional studies were reviewed today:  2D echo 07/12/2023: 1. Left ventricular ejection fraction, by estimation, is 55 to 60%. The  left ventricle has normal function. The left ventricle has no regional  wall motion abnormalities. Left ventricular diastolic parameters were  normal. The average left ventricular  global longitudinal strain is -15.1 %. The global longitudinal strain is  abnormal.   2. Right ventricular systolic function is normal. The right ventricular  size is normal. Tricuspid regurgitation signal is inadequate for assessing  PA pressure.   3. The mitral valve is normal in structure. No evidence of mitral valve  regurgitation. No evidence of mitral stenosis.   4. The aortic valve has an indeterminant  number of cusps. Aortic valve  regurgitation is not visualized. No aortic stenosis is present. Aortic  valve mean gradient measures 5.0 mmHg.   5. The inferior vena cava is normal in size with greater than 50%  respiratory variability, suggesting right atrial pressure of 3 mmHg.  __________  Zio patch 06/2023:   The patient was monitored for 14 days.   The predominant rhythm was sinus with an average rate of 96 bpm (range 60-165 bpm).   There were rare PACs and PVCs.   No sustained arrhythmia or prolonged pause was observed.   Patient triggered events correspond to normal sinus rhythm and sinus tachycardia as well as isolated PVCs.   Predominantly sinus rhythm with rare PACs and PVCs.  No significant arrhythmia observed.   EKG:  EKG is ordered today.  The EKG ordered today demonstrates NSR, 99 bpm, no acute ST-T changes  Recent Labs: 03/03/2023: ALT 16; BUN 9;  Creatinine, Ser 0.81; Hemoglobin 13.5; Platelets 340.0; Potassium 3.7; Sodium 139; TSH 1.45  Recent Lipid Panel    Component Value Date/Time   CHOL 117 09/01/2022 1235   TRIG 157.0 (H) 09/01/2022 1235   HDL 46.10 09/01/2022 1235   CHOLHDL 3 09/01/2022 1235   VLDL 31.4 09/01/2022 1235   LDLCALC 40 09/01/2022 1235   LDLCALC 51 09/03/2021 1121   LDLDIRECT 38.0 09/01/2022 1235    PHYSICAL EXAM:    VS:  BP 112/86   Pulse 99   Ht 5' 3 (1.6 m)   Wt 156 lb 9.6 oz (71 kg)   SpO2 98%   BMI 27.74 kg/m   BMI: Body mass index is 27.74 kg/m.  Physical Exam  Wt Readings from Last 3 Encounters:  08/02/23 156 lb 9.6 oz (71 kg)  06/21/23 161 lb 12.8 oz (73.4 kg)  03/03/23 166 lb (75.3 kg)     ASSESSMENT & PLAN:   Sinus tachycardia with palpitations: Zio patch overall reassuring without significant arrhythmia.  Echo without significant structural abnormality.  She remains on Cardizem  CD 240 mg daily.  Perhaps if amitriptyline  is able to be tapered we would be able to taper down Cardizem  as well.  No further cardiac testing indicated at this time.  Migraine disorder: Migraines are reasonably controlled with current regimen of amitriptyline  100 mg nightly with abortive therapy as needed.  I do suspect her high-dose amitriptyline  is contributing to tachypalpitations and associated sinus tachycardia.  I do wonder if she would feel better on lower dose amitriptyline  with possible addition of CGRP inhibitor.  We will refer her to [facility] neurology to have them weigh in on possible tapering of amitriptyline  to assist with tachypalpitations/sinus tachycardia burden.    Disposition: F/u with Dr. Mady or an APP in 6 months.   Medication Adjustments/Labs and Tests Ordered: Current medicines are reviewed at length with the patient today.  Concerns regarding medicines are outlined above. Medication changes, Labs and Tests ordered today are summarized above and listed in the Patient Instructions  accessible in Encounters.   Signed, Bernardino Bring, PA-C 08/02/2023 4:52 PM     [facility] [place] [place] [address] [place], [place] [address] [phone]

## 2023-08-02 ENCOUNTER — Ambulatory Visit: Attending: Physician Assistant | Admitting: Physician Assistant

## 2023-08-02 ENCOUNTER — Encounter: Payer: Self-pay | Admitting: Physician Assistant

## 2023-08-02 VITALS — BP 112/86 | HR 99 | Ht 63.0 in | Wt 156.6 lb

## 2023-08-02 DIAGNOSIS — R002 Palpitations: Secondary | ICD-10-CM

## 2023-08-02 DIAGNOSIS — G43809 Other migraine, not intractable, without status migrainosus: Secondary | ICD-10-CM

## 2023-08-02 DIAGNOSIS — R000 Tachycardia, unspecified: Secondary | ICD-10-CM

## 2023-08-02 NOTE — Patient Instructions (Signed)
 Medication Instructions:  Your physician recommends that you continue on your current medications as directed. Please refer to the Current Medication list given to you today.   *If you need a refill on your cardiac medications before your next appointment, please call your pharmacy*  Lab Work: None ordered at this time  If you have labs (blood work) drawn today and your tests are completely normal, you will receive your results only by: MyChart Message (if you have MyChart) OR A paper copy in the mail If you have any lab test that is abnormal or we need to change your treatment, we will call you to review the results.  Testing/Procedures: None ordered at this time   Follow-Up: At [facility], you and your health needs are our priority.  As part of our continuing mission to provide you with exceptional heart care, our providers are all part of one team.  This team includes your primary Cardiologist (physician) and Advanced Practice Providers or APPs (Physician Assistants and Nurse Practitioners) who all work together to provide you with the care you need, when you need it.  Your next appointment:   6 month(s)  Provider:   You may see Lonni Hanson, MD or Bernardino Bring, PA-C

## 2023-08-11 ENCOUNTER — Other Ambulatory Visit: Payer: Self-pay

## 2023-08-11 ENCOUNTER — Telehealth: Admitting: Nurse Practitioner

## 2023-08-11 DIAGNOSIS — J40 Bronchitis, not specified as acute or chronic: Secondary | ICD-10-CM

## 2023-08-11 MED ORDER — PROMETHAZINE-DM 6.25-15 MG/5ML PO SYRP
5.0000 mL | ORAL_SOLUTION | Freq: Four times a day (QID) | ORAL | 0 refills | Status: DC | PRN
  Filled 2023-08-11: qty 118, 6d supply, fill #0

## 2023-08-11 MED ORDER — ALBUTEROL SULFATE HFA 108 (90 BASE) MCG/ACT IN AERS
2.0000 | INHALATION_SPRAY | Freq: Four times a day (QID) | RESPIRATORY_TRACT | 0 refills | Status: AC | PRN
  Filled 2023-08-11: qty 8.5, 25d supply, fill #0

## 2023-08-11 NOTE — Progress Notes (Signed)
 E-Visit for Cough   We are sorry that you are not feeling well.  Here is how we plan to help!  Based on your presentation I believe you most likely have A cough due to a virus.  This is called viral bronchitis and is best treated by rest, plenty of fluids and control of the cough.  You may use Ibuprofen or Tylenol  as directed to help your symptoms.     In addition you may use : Meds ordered this encounter  Medications   albuterol  (VENTOLIN  HFA) 108 (90 Base) MCG/ACT inhaler    Sig: Inhale 2 puffs into the lungs every 6 (six) hours as needed for wheezing or shortness of breath.    Dispense:  8 g    Refill:  0   promethazine -dextromethorphan (PROMETHAZINE -DM) 6.25-15 MG/5ML syrup    Sig: Take 5 mLs by mouth 4 (four) times daily as needed.    Dispense:  118 mL    Refill:  0       From your responses in the eVisit questionnaire you describe inflammation in the upper respiratory tract which is causing a significant cough.  This is commonly called Bronchitis and has four common causes:   Allergies Viral Infections Acid Reflux Bacterial Infection Allergies, viruses and acid reflux are treated by controlling symptoms or eliminating the cause. An example might be a cough caused by taking certain blood pressure medications. You stop the cough by changing the medication. Another example might be a cough caused by acid reflux. Controlling the reflux helps control the cough.  USE OF BRONCHODILATOR (RESCUE) INHALERS: There is a risk from using your bronchodilator too frequently.  The risk is that over-reliance on a medication which only relaxes the muscles surrounding the breathing tubes can reduce the effectiveness of medications prescribed to reduce swelling and congestion of the tubes themselves.  Although you feel brief relief from the bronchodilator inhaler, your asthma may actually be worsening with the tubes becoming more swollen and filled with mucus.  This can delay other crucial  treatments, such as oral steroid medications. If you need to use a bronchodilator inhaler daily, several times per day, you should discuss this with your provider.  There are probably better treatments that could be used to keep your asthma under control.     HOME CARE Only take medications as instructed by your medical team. Complete the entire course of an antibiotic. Drink plenty of fluids and get plenty of rest. Avoid close contacts especially the very young and the elderly Cover your mouth if you cough or cough into your sleeve. Always remember to wash your hands A steam or ultrasonic humidifier can help congestion.   GET HELP RIGHT AWAY IF: You develop worsening fever. You become short of breath You cough up blood. Your symptoms persist after you have completed your treatment plan MAKE SURE YOU  Understand these instructions. Will watch your condition. Will get help right away if you are not doing well or get worse.    Thank you for choosing an e-visit.  Your e-visit answers were reviewed by a board certified advanced clinical practitioner to complete your personal care plan. Depending upon the condition, your plan could have included both over the counter or prescription medications.  Please review your pharmacy choice. Make sure the pharmacy is open so you can pick up prescription now. If there is a problem, you may contact your provider through [employer] and have the prescription routed to another pharmacy.  Your safety  is important to us . If you have drug allergies check your prescription carefully.   For the next 24 hours you can use MyChart to ask questions about today's visit, request a non-urgent call back, or ask for a work or school excuse. You will get an email in the next two days asking about your experience. I hope that your e-visit has been valuable and will speed your recovery.  I spent approximately 5 minutes reviewing the patient's history, current  symptoms and coordinating their care today.

## 2023-09-01 ENCOUNTER — Encounter: Payer: Commercial Managed Care - PPO | Admitting: Internal Medicine

## 2023-09-19 ENCOUNTER — Other Ambulatory Visit: Payer: Self-pay

## 2023-09-19 ENCOUNTER — Other Ambulatory Visit: Payer: Self-pay | Admitting: Internal Medicine

## 2023-09-20 ENCOUNTER — Encounter: Payer: Self-pay | Admitting: Internal Medicine

## 2023-09-20 ENCOUNTER — Other Ambulatory Visit: Payer: Self-pay

## 2023-09-20 ENCOUNTER — Ambulatory Visit (INDEPENDENT_AMBULATORY_CARE_PROVIDER_SITE_OTHER): Admitting: Internal Medicine

## 2023-09-20 VITALS — BP 114/68 | HR 126 | Ht 63.0 in | Wt 158.0 lb

## 2023-09-20 DIAGNOSIS — Z1231 Encounter for screening mammogram for malignant neoplasm of breast: Secondary | ICD-10-CM

## 2023-09-20 DIAGNOSIS — Z8639 Personal history of other endocrine, nutritional and metabolic disease: Secondary | ICD-10-CM

## 2023-09-20 DIAGNOSIS — D508 Other iron deficiency anemias: Secondary | ICD-10-CM

## 2023-09-20 DIAGNOSIS — Z91013 Allergy to seafood: Secondary | ICD-10-CM

## 2023-09-20 DIAGNOSIS — Z79899 Other long term (current) drug therapy: Secondary | ICD-10-CM

## 2023-09-20 DIAGNOSIS — B0229 Other postherpetic nervous system involvement: Secondary | ICD-10-CM

## 2023-09-20 DIAGNOSIS — R7301 Impaired fasting glucose: Secondary | ICD-10-CM | POA: Diagnosis not present

## 2023-09-20 DIAGNOSIS — Z0000 Encounter for general adult medical examination without abnormal findings: Secondary | ICD-10-CM | POA: Diagnosis not present

## 2023-09-20 DIAGNOSIS — J40 Bronchitis, not specified as acute or chronic: Secondary | ICD-10-CM

## 2023-09-20 DIAGNOSIS — E785 Hyperlipidemia, unspecified: Secondary | ICD-10-CM | POA: Diagnosis not present

## 2023-09-20 DIAGNOSIS — Z23 Encounter for immunization: Secondary | ICD-10-CM

## 2023-09-20 DIAGNOSIS — R002 Palpitations: Secondary | ICD-10-CM

## 2023-09-20 MED ORDER — GABAPENTIN 100 MG PO CAPS
200.0000 mg | ORAL_CAPSULE | Freq: Two times a day (BID) | ORAL | 2 refills | Status: AC
  Filled 2023-09-20: qty 360, 90d supply, fill #0
  Filled 2023-12-22 – 2024-01-16 (×2): qty 360, 90d supply, fill #1

## 2023-09-20 MED ORDER — EPINEPHRINE 0.3 MG/0.3ML IJ SOAJ
0.3000 mg | INTRAMUSCULAR | 1 refills | Status: AC | PRN
  Filled 2023-09-20: qty 2, 30d supply, fill #0

## 2023-09-20 MED ORDER — ALBUTEROL SULFATE HFA 108 (90 BASE) MCG/ACT IN AERS
2.0000 | INHALATION_SPRAY | Freq: Four times a day (QID) | RESPIRATORY_TRACT | 0 refills | Status: AC | PRN
  Filled 2023-09-20: qty 6.7, 25d supply, fill #0

## 2023-09-20 MED ORDER — BUTALBITAL-APAP-CAFFEINE 50-325-40 MG PO TABS
1.0000 | ORAL_TABLET | Freq: Two times a day (BID) | ORAL | 5 refills | Status: AC | PRN
  Filled 2023-09-20: qty 60, 15d supply, fill #0
  Filled 2023-12-22 – 2024-01-16 (×2): qty 60, 15d supply, fill #1

## 2023-09-20 MED ORDER — DILTIAZEM HCL ER COATED BEADS 240 MG PO CP24
240.0000 mg | ORAL_CAPSULE | Freq: Every day | ORAL | 1 refills | Status: AC
  Filled 2023-09-20: qty 90, 90d supply, fill #0
  Filled 2023-12-22 – 2024-01-16 (×2): qty 90, 90d supply, fill #1

## 2023-09-20 MED ORDER — SUMATRIPTAN SUCCINATE 6 MG/0.5ML ~~LOC~~ SOLN
6.0000 mg | Freq: Once | SUBCUTANEOUS | 0 refills | Status: DC
  Filled 2023-09-20: qty 6, 12d supply, fill #0

## 2023-09-20 NOTE — Patient Instructions (Addendum)
 The Neurology referral was sent to [facility] in [place]. You can give their office a call to schedule at [phone].  If it is going to be months before they can see you,  let me know and I will start  the transition from elavil  to a different preventive medication for your migraines  I have refilled the imitrex  as an INJECTION .  THE DOSE IS 0.6 MG . IT CAN BE REPEATED AFTER ONE HOUR IF NEEDED    EPI PEN SENT TO PHARMACY  I RECOMMEND STARTING A DAILY ANTHISTAMINE/FAMOTIDINE COMBINATION (ALLEGRA AND GENERIC PEPCID) TO HELP PREVENT ALLERGIC REACTIONS   YOUR MAMMOGRAM has been ordered , PLEASE CALL AND GET THIS SCHEDULED! [facility] - call [phone]  Veronda does  the scheduling for [facility] as well      Try using 1/2 tablet of Imodium before you eat to control the post prandial diarrhea

## 2023-09-20 NOTE — Progress Notes (Unsigned)
 Patient ID: Lori Douglas, female    DOB: Apr 05, 1985  Age: 38 y.o. MRN: [id number]  The patient is here for annual preventive examination and management of other chronic and acute problems.   The risk factors are reflected in the social history.   The roster of all physicians providing medical care to patient - is listed in the Snapshot section of the chart.   Activities of daily living:  The patient is 100% independent in all ADLs: dressing, toileting, feeding as well as independent mobility   Home safety : The patient has smoke detectors in the home. They wear seatbelts.  There are no unsecured firearms at home. There is no violence in the home.    There is no risks for hepatitis, STDs or HIV. There is no   history of blood transfusion. They have no travel history to infectious disease endemic areas of the world.   The patient has seen their dentist in the last six month. They have seen their eye doctor in the last year. The patinet  denies slight hearing difficulty with regard to whispered voices and some television programs.  They have deferred audiologic testing in the last year.  They do not  have excessive sun exposure. Discussed the need for sun protection: hats, long sleeves and use of sunscreen if there is significant sun exposure.    Diet: the importance of a healthy diet is discussed. They do have a healthy diet.   The benefits of regular aerobic exercise were discussed. The patient  exercises  3 to 5 days per week  for  60 minutes.    Depression screen: there are no signs or vegative symptoms of depression- irritability, change in appetite, anhedonia, sadness/tearfullness.   The following portions of the patient's history were reviewed and updated as appropriate: allergies, current medications, past family history, past medical history,  past surgical history, past social history  and problem list.   Visual acuity was not assessed per patient preference since the patient has  regular follow up with an  ophthalmologist. Hearing and body mass index were assessed and reviewed.    During the course of the visit the patient was educated and counseled about appropriate screening and preventive services including : fall prevention , diabetes screening, nutrition counseling, colorectal cancer screening, and recommended immunizations.    Chief Complaint:   1) shellfish allergy:  requesting an EPI pen  2) tachycardia  referred to [facility] cardiology;  hjigh dose of elavil  suspected as the cause.  Referred to  [facility] neurology by cardiology for management of migraines without Elavil  in July .  Has not been cotacted yet but referral has been authorized      Review of Symptoms  Patient denies headache, fevers, malaise, unintentional weight loss, skin rash, eye pain, sinus congestion and sinus pain, sore throat, dysphagia,  hemoptysis , cough, dyspnea, wheezing, chest pain, palpitations, orthopnea, edema, abdominal pain, nausea, melena, diarrhea, constipation, flank pain, dysuria, hematuria, urinary  Frequency, nocturia, numbness, tingling, seizures,  Focal weakness, Loss of consciousness,  Tremor, insomnia, depression, anxiety, and suicidal ideation.    Physical Exam:  BP 114/68   Pulse (!) 126   Ht 5' 3 (1.6 m)   Wt 158 lb (71.7 kg)   SpO2 99%   BMI 27.99 kg/m    Physical Exam Vitals reviewed.  Constitutional:      General: She is not in acute distress.    Appearance: Normal appearance. She is well-developed and normal weight. She is  not ill-appearing, toxic-appearing or diaphoretic.  HENT:     Head: Normocephalic.     Right Ear: Tympanic membrane, ear canal and external ear normal. There is no impacted cerumen.     Left Ear: Tympanic membrane, ear canal and external ear normal. There is no impacted cerumen.     Nose: Nose normal.     Mouth/Throat:     Mouth: Mucous membranes are moist.     Pharynx: Oropharynx is clear.  Eyes:     General: No scleral  icterus.       Right eye: No discharge.        Left eye: No discharge.     Conjunctiva/sclera: Conjunctivae normal.     Pupils: Pupils are equal, round, and reactive to light.  Neck:     Thyroid : No thyromegaly.     Vascular: No carotid bruit or JVD.  Cardiovascular:     Rate and Rhythm: Normal rate and regular rhythm.     Heart sounds: Normal heart sounds.  Pulmonary:     Effort: Pulmonary effort is normal. No respiratory distress.     Breath sounds: Normal breath sounds.  Chest:  Breasts:    Breasts are symmetrical.     Right: Normal. No swelling, inverted nipple, mass, nipple discharge, skin change or tenderness.     Left: Normal. No swelling, inverted nipple, mass, nipple discharge, skin change or tenderness.  Abdominal:     General: Bowel sounds are normal.     Palpations: Abdomen is soft. There is no mass.     Tenderness: There is no abdominal tenderness. There is no guarding or rebound.  Musculoskeletal:        General: Normal range of motion.     Cervical back: Normal range of motion and neck supple.  Lymphadenopathy:     Cervical: No cervical adenopathy.     Upper Body:     Right upper body: No supraclavicular, axillary or pectoral adenopathy.     Left upper body: No supraclavicular, axillary or pectoral adenopathy.  Skin:    General: Skin is warm and dry.  Neurological:     General: No focal deficit present.     Mental Status: She is alert and oriented to person, place, and time. Mental status is at baseline.  Psychiatric:        Mood and Affect: Mood normal.        Behavior: Behavior normal.        Thought Content: Thought content normal.        Judgment: Judgment normal.     Assessment and Plan: Encounter for preventive health examination Assessment & Plan: age appropriate education and counseling updated, referrals for preventative services and immunizations addressed, dietary and smoking counseling addressed, most recent labs reviewed.  I have personally  reviewed and have noted:   1) the patient's medical and social history 2) The pt's use of alcohol, tobacco, and illicit drugs 3) The patient's current medications and supplements 4) Functional ability including ADL's, fall risk, home safety risk, hearing and visual impairment 5) Diet and physical activities 6) Evidence for depression or mood disorder 7) The patient's height, weight, and BMI have been recorded in the chart     I have made referrals, and provided counseling and education based on review of the above    Bronchitis -     Albuterol  Sulfate HFA; Inhale 2 puffs into the lungs every 6 (six) hours as needed for wheezing or shortness of breath.  Dispense:  6.7 g; Refill: 0  Long-term use of high-risk medication -     CBC with Differential/Platelet -     TSH  Impaired fasting glucose -     Comprehensive metabolic panel with GFR  Hyperlipidemia, unspecified hyperlipidemia type  Other iron deficiency anemia -     IBC + Ferritin -     IBC + Ferritin; Future  Encounter for screening mammogram for malignant neoplasm of breast -     3D Screening Mammogram, Left and Right; Future  Need for Tdap vaccination -     Tdap vaccine greater than or equal to 7yo IM  History of iron deficiency Assessment & Plan: Still low.  Rec 6 weeks of oral supplementation  Lab Results  Component Value Date   IRON 75 09/20/2023   TIBC 397.6 09/20/2023   FERRITIN 21.4 09/20/2023    Lab Results  Component Value Date   WBC 4.9 09/20/2023   HGB 12.9 09/20/2023   HCT 39.2 09/20/2023   MCV 87.7 09/20/2023   PLT 330.0 09/20/2023      Shellfish allergy Assessment & Plan: Given her recurrent symptoms recommend daily histamine blockade with H1/H2 blockers.  Epi pen prescribed    Post herpetic neuralgia Assessment & Plan: Improving but not resolved. .  She continues to use  gabapentin  daily ,  and alpha lipoic acid supplement daily      Palpitations Assessment & Plan: Attributed by  cardiology to high dose elavil ,  used for migraine prophylaxis.  Referral to neurology in progress,  may require change intherapy to Quilipta  prior to appt depending on time frame.    Other orders -     dilTIAZem  HCl ER Coated Beads; Take 1 capsule (240 mg total) by mouth daily.  Dispense: 90 capsule; Refill: 1 -     Gabapentin ; Take 2 capsules (200 mg total) by mouth 2 (two) times daily.  Dispense: 360 capsule; Refill: 2 -     EPINEPHrine ; Inject 0.3 mg into the muscle as needed for anaphylaxis.  Dispense: 1 each; Refill: 1 -     SUMAtriptan  Succinate; Inject 0.5 mLs (6 mg total) into the skin once for 1 dose. May repeat in 1 hour  if headache persists or recurs.  Dispense: 6 mL; Refill: 0    No follow-ups on file.  Verneita LITTIE Kettering, MD

## 2023-09-21 ENCOUNTER — Other Ambulatory Visit: Payer: Self-pay

## 2023-09-21 LAB — COMPREHENSIVE METABOLIC PANEL WITH GFR
ALT: 10 U/L (ref 0–35)
AST: 11 U/L (ref 0–37)
Albumin: 4.3 g/dL (ref 3.5–5.2)
Alkaline Phosphatase: 74 U/L (ref 39–117)
BUN: 7 mg/dL (ref 6–23)
CO2: 28 meq/L (ref 19–32)
Calcium: 9.1 mg/dL (ref 8.4–10.5)
Chloride: 103 meq/L (ref 96–112)
Creatinine, Ser: 0.84 mg/dL (ref 0.40–1.20)
GFR: 89.71 mL/min (ref >60.00)
Glucose, Bld: 91 mg/dL (ref 70–99)
Potassium: 3.9 meq/L (ref 3.5–5.1)
Sodium: 138 meq/L (ref 135–145)
Total Bilirubin: 0.4 mg/dL (ref 0.2–1.2)
Total Protein: 6.5 g/dL (ref 6.0–8.3)

## 2023-09-21 LAB — CBC WITH DIFFERENTIAL/PLATELET
Basophils Absolute: 0.1 K/uL (ref 0.0–0.1)
Basophils Relative: 1.3 % (ref 0.0–3.0)
Eosinophils Absolute: 0.1 K/uL (ref 0.0–0.7)
Eosinophils Relative: 2 % (ref 0.0–5.0)
HCT: 39.2 % (ref 36.0–46.0)
Hemoglobin: 12.9 g/dL (ref 12.0–15.0)
Lymphocytes Relative: 36.5 % (ref 12.0–46.0)
Lymphs Abs: 1.8 K/uL (ref 0.7–4.0)
MCHC: 32.9 g/dL (ref 30.0–36.0)
MCV: 87.7 fl (ref 78.0–100.0)
Monocytes Absolute: 0.3 K/uL (ref 0.1–1.0)
Monocytes Relative: 5.3 % (ref 3.0–12.0)
Neutro Abs: 2.7 K/uL (ref 1.4–7.7)
Neutrophils Relative %: 54.9 % (ref 43.0–77.0)
Platelets: 330 K/uL (ref 150.0–400.0)
RBC: 4.46 Mil/uL (ref 3.87–5.11)
RDW: 13.3 % (ref 11.5–15.5)
WBC: 4.9 K/uL (ref 4.0–10.5)

## 2023-09-21 LAB — IBC + FERRITIN
Ferritin: 21.4 ng/mL (ref 10.0–291.0)
Iron: 75 ug/dL (ref 42–145)
Saturation Ratios: 18.9 % — ABNORMAL LOW (ref 20.0–50.0)
TIBC: 397.6 ug/dL (ref 250.0–450.0)
Transferrin: 284 mg/dL (ref 212.0–360.0)

## 2023-09-21 LAB — TSH: TSH: 0.81 u[IU]/mL (ref 0.35–5.50)

## 2023-09-22 ENCOUNTER — Ambulatory Visit: Payer: Self-pay | Admitting: Internal Medicine

## 2023-09-22 DIAGNOSIS — Z8639 Personal history of other endocrine, nutritional and metabolic disease: Secondary | ICD-10-CM | POA: Insufficient documentation

## 2023-09-22 DIAGNOSIS — Z91013 Allergy to seafood: Secondary | ICD-10-CM | POA: Insufficient documentation

## 2023-09-22 NOTE — Assessment & Plan Note (Signed)

## 2023-09-22 NOTE — Assessment & Plan Note (Signed)
 Attributed by cardiology to high dose elavil ,  used for migraine prophylaxis.  Referral to neurology in progress,  may require change intherapy to Quilipta  prior to appt depending on time frame.

## 2023-09-22 NOTE — Assessment & Plan Note (Signed)
 Given her recurrent symptoms recommend daily histamine blockade with H1/H2 blockers.  Epi pen prescribed

## 2023-09-22 NOTE — Assessment & Plan Note (Signed)
 Still low.  Rec 6 weeks of oral supplementation  Lab Results  Component Value Date   IRON 75 09/20/2023   TIBC 397.6 09/20/2023   FERRITIN 21.4 09/20/2023    Lab Results  Component Value Date   WBC 4.9 09/20/2023   HGB 12.9 09/20/2023   HCT 39.2 09/20/2023   MCV 87.7 09/20/2023   PLT 330.0 09/20/2023

## 2023-09-22 NOTE — Assessment & Plan Note (Signed)
 Improving but not resolved. .  She continues to use  gabapentin  daily ,  and alpha lipoic acid supplement daily

## 2023-10-17 ENCOUNTER — Telehealth: Payer: Self-pay | Admitting: Internal Medicine

## 2023-10-17 NOTE — Telephone Encounter (Signed)
 Lm and sent MyChart message to call and rescheduled 09/23/2024 CPE.  E2C2 please reschedule appt

## 2023-10-24 ENCOUNTER — Other Ambulatory Visit: Payer: Self-pay | Admitting: Internal Medicine

## 2023-10-24 ENCOUNTER — Other Ambulatory Visit: Payer: Self-pay

## 2023-10-24 ENCOUNTER — Encounter: Payer: Self-pay | Admitting: Internal Medicine

## 2023-10-24 MED ORDER — AMITRIPTYLINE HCL 100 MG PO TABS
100.0000 mg | ORAL_TABLET | Freq: Every day | ORAL | 1 refills | Status: AC
  Filled 2023-10-24: qty 90, 90d supply, fill #0
  Filled 2024-01-16: qty 90, 90d supply, fill #1

## 2023-10-24 NOTE — Telephone Encounter (Signed)
 Refilled: 04/2023 Last OV: 09/20/2023 Next OV: not scheduled

## 2023-10-26 MED ORDER — ALPRAZOLAM 0.25 MG PO TABS
0.2500 mg | ORAL_TABLET | Freq: Every evening | ORAL | 5 refills | Status: AC | PRN
  Filled 2023-10-26: qty 30, 30d supply, fill #0
  Filled 2024-01-16: qty 30, 30d supply, fill #1

## 2023-10-27 ENCOUNTER — Other Ambulatory Visit: Payer: Self-pay

## 2023-11-17 ENCOUNTER — Other Ambulatory Visit: Payer: Self-pay | Admitting: Internal Medicine

## 2023-11-18 ENCOUNTER — Other Ambulatory Visit: Payer: Self-pay

## 2023-11-18 ENCOUNTER — Other Ambulatory Visit: Payer: Self-pay | Admitting: Internal Medicine

## 2023-11-20 ENCOUNTER — Other Ambulatory Visit: Payer: Self-pay

## 2023-11-20 ENCOUNTER — Other Ambulatory Visit: Payer: Self-pay | Admitting: Internal Medicine

## 2023-11-21 ENCOUNTER — Other Ambulatory Visit: Payer: Self-pay

## 2023-11-21 MED ORDER — CYCLOBENZAPRINE HCL 5 MG PO TABS
5.0000 mg | ORAL_TABLET | Freq: Three times a day (TID) | ORAL | 2 refills | Status: DC | PRN
  Filled 2023-11-21: qty 90, 30d supply, fill #0

## 2023-11-21 MED FILL — Cyclobenzaprine HCl Tab 5 MG: ORAL | 30 days supply | Qty: 90 | Fill #0 | Status: AC

## 2023-11-23 ENCOUNTER — Telehealth: Admitting: Physician Assistant

## 2023-11-23 ENCOUNTER — Other Ambulatory Visit: Payer: Self-pay

## 2023-11-23 DIAGNOSIS — B9689 Other specified bacterial agents as the cause of diseases classified elsewhere: Secondary | ICD-10-CM | POA: Diagnosis not present

## 2023-11-23 DIAGNOSIS — J019 Acute sinusitis, unspecified: Secondary | ICD-10-CM | POA: Diagnosis not present

## 2023-11-23 MED ORDER — FLUCONAZOLE 150 MG PO TABS
150.0000 mg | ORAL_TABLET | Freq: Once | ORAL | 0 refills | Status: DC
  Filled 2023-11-23: qty 1, 1d supply, fill #0

## 2023-11-23 MED ORDER — LEVOFLOXACIN 500 MG PO TABS
500.0000 mg | ORAL_TABLET | Freq: Every day | ORAL | 0 refills | Status: AC
  Filled 2023-11-23: qty 7, 7d supply, fill #0

## 2023-11-23 MED ORDER — FLUCONAZOLE 150 MG PO TABS: 150.0000 mg | ORAL_TABLET | Freq: Once | ORAL | 0 refills | Status: AC

## 2023-11-23 NOTE — Addendum Note (Signed)
 Addended byBETHA ROLAN BERTHOLD on: 11/23/2023 02:05 PM   Modules accepted: Orders

## 2023-11-23 NOTE — Progress Notes (Signed)
 E-Visit for Sinus Problems  We are sorry that you are not feeling well.  Here is how we plan to help!  Based on what you have shared with me it looks like you have sinusitis.  Sinusitis is inflammation and infection in the sinus cavities of the head.  Based on your presentation I believe you most likely have Acute Bacterial Sinusitis.  This is an infection caused by bacteria and is treated with antibiotics. I have prescribed Levofloxicin 500mg  by mouth once daily for 7 days. You may use an oral decongestant such as Mucinex D or if you have glaucoma or high blood pressure use plain Mucinex. Saline nasal spray help and can safely be used as often as needed for congestion.  If you develop worsening sinus pain, fever or notice severe headache and vision changes, or if symptoms are not better after completion of antibiotic, please schedule an appointment with a health care provider.    Sinus infections are not as easily transmitted as other respiratory infection, however we still recommend that you avoid close contact with loved ones, especially the very young and elderly.  Remember to wash your hands thoroughly throughout the day as this is the number one way to prevent the spread of infection!  Home Care: Only take medications as instructed by your medical team. Complete the entire course of an antibiotic. Do not take these medications with alcohol. A steam or ultrasonic humidifier can help congestion.  You can place a towel over your head and breathe in the steam from hot water coming from a faucet. Avoid close contacts especially the very young and the elderly. Cover your mouth when you cough or sneeze. Always remember to wash your hands.  Get Help Right Away If: You develop worsening fever or sinus pain. You develop a severe head ache or visual changes. Your symptoms persist after you have completed your treatment plan.  Make sure you Understand these instructions. Will watch your  condition. Will get help right away if you are not doing well or get worse.  Your e-visit answers were reviewed by a board certified advanced clinical practitioner to complete your personal care plan.  Depending on the condition, your plan could have included both over the counter or prescription medications.  If there is a problem please reply  once you have received a response from your provider.  Your safety is important to us .  If you have drug allergies check your prescription carefully.    You can use MyChart to ask questions about today's visit, request a non-urgent call back, or ask for a work or school excuse for 24 hours related to this e-Visit. If it has been greater than 24 hours you will need to follow up with your provider, or enter a new e-Visit to address those concerns.  You will get an e-mail in the next two days asking about your experience.  I hope that your e-visit has been valuable and will speed your recovery. Thank you for using e-visits.  I have spent 5 minutes in review of e-visit questionnaire, review and updating patient chart, medical decision making and response to patient.   Elsie Velma Lunger, PA-C

## 2023-11-23 NOTE — Addendum Note (Signed)
 Addended by: GLADIS FALLOW C on: 11/23/2023 02:00 PM   Modules accepted: Orders

## 2023-11-29 ENCOUNTER — Other Ambulatory Visit: Payer: Self-pay

## 2023-12-22 ENCOUNTER — Other Ambulatory Visit: Payer: Self-pay | Admitting: Internal Medicine

## 2023-12-22 ENCOUNTER — Other Ambulatory Visit: Payer: Self-pay

## 2023-12-23 ENCOUNTER — Other Ambulatory Visit: Payer: Self-pay

## 2023-12-23 MED FILL — Trazodone HCl Tab 50 MG: ORAL | 90 days supply | Qty: 90 | Fill #0 | Status: AC

## 2023-12-23 MED FILL — Sumatriptan Succinate Inj 6 MG/0.5ML: SUBCUTANEOUS | 6 days supply | Qty: 3 | Fill #0 | Status: AC

## 2023-12-25 ENCOUNTER — Other Ambulatory Visit: Payer: Self-pay

## 2023-12-29 ENCOUNTER — Other Ambulatory Visit: Payer: Self-pay

## 2024-01-02 ENCOUNTER — Other Ambulatory Visit: Payer: Self-pay

## 2024-01-16 ENCOUNTER — Other Ambulatory Visit: Payer: Self-pay | Admitting: Internal Medicine

## 2024-01-17 ENCOUNTER — Other Ambulatory Visit: Payer: Self-pay

## 2024-01-19 ENCOUNTER — Other Ambulatory Visit: Payer: Self-pay

## 2024-01-19 MED ORDER — CLOBETASOL PROPIONATE 0.05 % EX SOLN
Freq: Two times a day (BID) | CUTANEOUS | 3 refills | Status: AC
  Filled 2024-01-19: qty 50, 30d supply, fill #0

## 2024-01-19 MED ORDER — SUMATRIPTAN SUCCINATE 25 MG PO TABS
25.0000 mg | ORAL_TABLET | Freq: Every day | ORAL | 0 refills | Status: AC | PRN
  Filled 2024-01-19: qty 9, 15d supply, fill #0

## 2024-01-30 ENCOUNTER — Telehealth: Admitting: Family Medicine

## 2024-01-30 DIAGNOSIS — H9202 Otalgia, left ear: Secondary | ICD-10-CM

## 2024-01-30 MED ORDER — CIPROFLOXACIN HCL 500 MG PO TABS: 500.0000 mg | ORAL_TABLET | Freq: Two times a day (BID) | ORAL | 0 refills | Status: AC

## 2024-01-30 MED ORDER — FLUCONAZOLE 150 MG PO TABS: 150.0000 mg | ORAL_TABLET | Freq: Every day | ORAL | 0 refills | Status: AC

## 2024-01-30 NOTE — Addendum Note (Signed)
 Addended by: ALMEDA DEGREE on: 01/30/2024 06:27 PM   Modules accepted: Orders

## 2024-01-30 NOTE — Progress Notes (Signed)
 "  E-Visit for Ear Pain - Acute Otitis Media   We are sorry that you are not feeling well. Here is how we plan to help!  Based on what you have shared with me it looks like you have Acute Otitis Media.  Acute Otitis Media is an infection of the middle or inner ear. This type of infection can cause redness, inflammation, and fluid buildup behind the tympanic membrane (ear drum).  The usual symptoms include: Earache/Pain Fever Upper respiratory symptoms Lack of energy/Fatigue/Malaise Slight hearing loss gradually worsening- if the inner ear fills with fluid What causes middle ear infections? Most middle ear infections occur when an infection such as a cold, leads to a build-up of mucus in the middle ear and causes the Eustachian tube (a thin tube that runs from the middle ear to the back of the nose) to become swollen or blocked.   This means mucus can't drain away properly, making it easier for an infection to spread into the middle ear.  How middle ear infections are treated: Most ear infections clear up within three to five days and don't need any specific treatment. If necessary, tylenol  or ibuprofen should be used to relieve pain and a high temperature.  If you develop a fever higher than 102, or any significantly worsening symptoms, this could indicate a more serious infection moving to the middle/inner and needs face to face evaluation in an office by a provider.   Antibiotics aren't routinely used to treat middle ear infections, although they may occasionally be prescribed if symptoms persist or are particularly severe. Given your presentation,   I have prescribed Cipro  500 mg one tablet by mouth twice a day for 10 days   Your symptoms should improve over the next 3 days and should resolve in about 7 days. Be sure to complete ALL of the prescription(s) given.  HOME CARE: Wash your hands frequently. If you are prescribed an ear drop, do not place the tip of the bottle on your ear or  touch it with your fingers. You can take Acetaminophen  650 mg every 4-6 hours as needed for pain.  If pain is severe or moderate, you can apply a heating pad (set on low) or hot water bottle (wrapped in a towel) to outer ear for 20 minutes.  This will also increase drainage.  GET HELP RIGHT AWAY IF: Fever is over 102.2 degrees. You develop progressive ear pain or hearing loss. Ear symptoms persist longer than 3 days after treatment.  MAKE SURE YOU: Understand these instructions. Will watch your condition. Will get help right away if you are not doing well or get worse.  Thank you for choosing an e-visit.  Your e-visit answers were reviewed by a board certified advanced clinical practitioner to complete your personal care plan. Depending upon the condition, your plan could have included both over the counter or prescription medications.  Please review your pharmacy choice. Make sure the pharmacy is open so you can pick up the prescription now. If there is a problem, you may contact your provider through [employer] and have the prescription routed to another pharmacy.  Your safety is important to us . If you have drug allergies check your prescription carefully.   For the next 24 hours you can use MyChart to ask questions about today's visit, request a non-urgent call back, or ask for a work or school excuse. You will get an email with a survey after your eVisit asking about your experience. We would  appreciate your feedback. I hope that your e-visit has been valuable and will aid in your recovery.  I have spent 5 minutes in review of e-visit questionnaire, review and updating patient chart, medical decision making and response to patient.   Santhosh Gulino, FNP      "

## 2024-09-23 ENCOUNTER — Encounter: Admitting: Internal Medicine
